# Patient Record
Sex: Male | Born: 1935 | Race: Black or African American | Hispanic: No | Smoking: Current every day smoker
Health system: Southern US, Community
[De-identification: ages and names within clinical notes are randomized; demographics above are authoritative.]

## PROBLEM LIST (undated history)

## (undated) DIAGNOSIS — R339 Retention of urine, unspecified: Secondary | ICD-10-CM

---

## 2014-03-11 ENCOUNTER — Encounter (HOSPITAL_COMMUNITY): Payer: Self-pay | Admitting: Emergency Medicine

## 2014-03-11 ENCOUNTER — Emergency Department (HOSPITAL_COMMUNITY)
Admission: EM | Admit: 2014-03-11 | Discharge: 2014-03-11 | Disposition: A | Discharge status: Home / Self Care | Payer: Medicare Other | Attending: Emergency Medicine | Admitting: Emergency Medicine

## 2014-03-11 DIAGNOSIS — Z720 Tobacco use: Secondary | ICD-10-CM | POA: Diagnosis not present

## 2014-03-11 DIAGNOSIS — R600 Localized edema: Secondary | ICD-10-CM | POA: Insufficient documentation

## 2014-03-11 DIAGNOSIS — R200 Anesthesia of skin: Secondary | ICD-10-CM | POA: Diagnosis present

## 2014-03-11 DIAGNOSIS — R252 Cramp and spasm: Secondary | ICD-10-CM | POA: Insufficient documentation

## 2014-03-11 LAB — CK: Total CK: 215 U/L (ref 7–232)

## 2014-03-11 MED ORDER — ENOXAPARIN SODIUM 100 MG/ML ~~LOC~~ SOLN
1.0000 mg/kg | Freq: Once | SUBCUTANEOUS | Status: AC
  Administered 2014-03-11: 85 mg via SUBCUTANEOUS
  Filled 2014-03-11: qty 1

## 2014-03-11 NOTE — ED Notes (Signed)
Pt arrived via EMS with c/o lt leg numbness for 1.5 yr. Numbness worse tonight. Pt was unable to get off couch.(+)PMS, no injury/deformity, CRT brisk. No swelling noted. Pt able to push/pull against hand. Unable to lift leg.

## 2014-03-11 NOTE — ED Notes (Signed)
Resting quietly with eye closed. Easily arousable. Verbally responsive. Resp even and unlabored. ABC's intact. NAD noted.  

## 2014-03-11 NOTE — ED Notes (Signed)
Resting quietly with eye closed. Easily arousable. Verbally responsive. Resp even and unlabored. ABC's intact.   

## 2014-03-11 NOTE — ED Provider Notes (Signed)
CSN: [id number]     Arrival date & time 03/11/14  0049 History   First MD Initiated Contact with Patient 03/11/14 0310     Chief Complaint  Patient presents with  . Numbness     (Consider location/radiation/quality/duration/timing/severity/associated sxs/prior Treatment) HPI  Edgar Mckay is a 79 y.o. male with unknown PMh (has not see a physician in over 30years) presenting today with left leg cramping. Nursing notes states numbness however the patient denies this. He states his leg has been cramping off and on for a year and a half. He came to the emergency department tonight because the pain was so severe he fell over. It was difficult for him to stand back up. Patient currently is denying any cramping, his symptoms have resolved. He denies any neurological symptoms such as muscle weakness or numbness anywhere. He denies any history of blood clots, but states she's had swelling in that leg as well. Patient has no history of trauma to the leg. Patient has no further complaints.  10 Systems reviewed and are negative for acute change except as noted in the HPI.     History reviewed. No pertinent past medical history. History reviewed. No pertinent past surgical history. Family History  Problem Relation Age of Onset  . Family history unknown: Yes   History  Substance Use Topics  . Smoking status: Current Every Day Smoker    Types: Cigarettes  . Smokeless tobacco: Not on file  . Alcohol Use: Yes    Review of Systems    Allergies  Review of patient's allergies indicates no known allergies.  Home Medications   Prior to Admission medications   Not on File   BP 149/73 mmHg  Pulse 60  Temp(Src) 98.3 F (36.8 C) (Oral)  Resp 16  Ht  (1.753 m)  Wt 190 lb (86.183 kg)  BMI 28.05 kg/m2  SpO2 95% Physical Exam  Constitutional: He is oriented to person, place, and time. Vital signs are normal. He appears well-developed and well-nourished.  Non-toxic appearance. He does not  appear ill. No distress.  HENT:  Head: Normocephalic and atraumatic.  Nose: Nose normal.  Mouth/Throat: Oropharynx is clear and moist. No oropharyngeal exudate.  Eyes: Conjunctivae and EOM are normal. Pupils are equal, round, and reactive to light. No scleral icterus.  Neck: Normal range of motion. Neck supple. No tracheal deviation, no edema, no erythema and normal range of motion present. No thyroid mass and no thyromegaly present.  Cardiovascular: Normal rate, regular rhythm, S1 normal, S2 normal, normal heart sounds, intact distal pulses and normal pulses.  Exam reveals no gallop and no friction rub.   No murmur heard. Pulses:      Radial pulses are 2+ on the right side, and 2+ on the left side.       Dorsalis pedis pulses are 2+ on the right side, and 2+ on the left side.  Pulmonary/Chest: Effort normal and breath sounds normal. No respiratory distress. He has no wheezes. He has no rhonchi. He has no rales.  Abdominal: Soft. Normal appearance and bowel sounds are normal. He exhibits no distension, no ascites and no mass. There is no hepatosplenomegaly. There is no tenderness. There is no rebound, no guarding and no CVA tenderness.  Musculoskeletal: Normal range of motion. He exhibits edema. He exhibits no tenderness.  Bilateral lower history edema, 1+. No calf tenderness bilaterally, negative Homans sign bilaterally.  Lymphadenopathy:    He has no cervical adenopathy.  Neurological: He is alert and  oriented to person, place, and time. He has normal strength. No cranial nerve deficit or sensory deficit. He exhibits normal muscle tone.  Normal strength and sensation x 4 ext, normal cerebellar testing  Skin: Skin is warm, dry and intact. No petechiae and no rash noted. He is not diaphoretic. No erythema. No pallor.  Nursing note and vitals reviewed.   ED Course  Procedures (including critical care time) Labs Review Labs Reviewed  CK    Imaging Review No results found.   EKG  Interpretation None      MDM   Final diagnoses:  None    Patient presents to the ED for leg cramping x 1.5 years.  He denies any numbness after being asked multiple times by myself, despite nursing notes.  He denies any trauma and he denies risk factors for DVT. Nevertheless he states he had cramping in his calf muscle as well as swelling in the leg. CK level was obtained. CKs 2:15. Leg swelling is equal for me on exam. Patient was given Lovenox emergency department and instructed to return tomorrow for ultrasound.  X-rays not indicated as there is no history of trauma. At this time his vital signs were within his normal limits and he is safe for discharge.    Tomasita CrumbleAdeleke Jeramy Dimmick, MD 03/11/14 [phone]

## 2014-03-11 NOTE — ED Notes (Signed)
Bed: WA04 Expected date: 03/11/14 Expected time: 12:33 AM Means of arrival: Ambulance Comments: Leg numbness

## 2014-03-11 NOTE — ED Notes (Signed)
Awake. Verbally responsive. A/O x4. Resp even and unlabored. No audible adventitious breath sounds noted. ABC's intact.  

## 2014-03-11 NOTE — Discharge Instructions (Signed)
Leg Cramps Mr. Edgar Mckay, your blood levels do not show any damage to the muscles. Your given a blood thinner because he may have a blood clot in your left leg. [facility] first thing in the morning at 7 AM to obtain an ultrasound of your leg. This is very important to assess for blood clots. Follow-up with her primary care physician within 3 days for further management. If symptoms worsen come back to the emergency department immediately. Thank you. Leg cramps that occur during exercise can be caused by poor circulation or dehydration. However, muscle cramps that occur at rest or during the night are usually not due to any serious medical problem. Heat cramps may cause muscle spasms during hot weather.  CAUSES There is no clear cause for muscle cramps. However, dehydration may be a factor for those who do not drink enough fluids and those who exercise in the heat. Imbalances in the level of sodium, potassium, calcium or magnesium in the muscle tissue may also be a factor. Some medications, such as water pills (diuretics), may cause loss of chemicals that the body needs (like sodium and potassium) and cause muscle cramps. TREATMENT   Make sure your diet has enough fluids and essential minerals for the muscle to work normally.  Avoid strenuous exercise for several days if you have been having frequent leg cramps.  Stretch and massage the cramped muscle for several minutes.  Some medicines may be helpful in some patients with night cramps. Only take over-the-counter or prescription medicines as directed by your caregiver. SEEK IMMEDIATE MEDICAL CARE IF:   Your leg cramps become worse.  Your foot becomes cold, numb, or blue. Document Released: 02/04/2004 Document Revised: 03/21/2011 Document Reviewed: 01/22/2008 [facility] [employer] Patient Information 2015 [place], [place]. This information is not intended to replace advice given to you by your health care provider. Make sure you discuss  any questions you have with your health care provider.

## 2014-03-13 ENCOUNTER — Ambulatory Visit (HOSPITAL_COMMUNITY)
Admission: RE | Admit: 2014-03-13 | Discharge: 2014-03-13 | Disposition: A | Discharge status: Home / Self Care | Payer: Medicare Other | Source: Ambulatory Visit | Attending: Emergency Medicine | Admitting: Emergency Medicine

## 2014-03-13 DIAGNOSIS — M7989 Other specified soft tissue disorders: Secondary | ICD-10-CM | POA: Diagnosis present

## 2014-03-13 NOTE — Progress Notes (Signed)
VASCULAR LAB PRELIMINARY  PRELIMINARY  PRELIMINARY  PRELIMINARY  Left lower extremity venous duplex completed.    Preliminary report:  Left:  No evidence of DVT, superficial thrombosis, or Baker's cyst.  Evadene Wardrip, RVT 03/13/2014, 2:51 PM

## 2019-03-07 ENCOUNTER — Other Ambulatory Visit: Payer: Self-pay

## 2019-03-07 ENCOUNTER — Encounter (HOSPITAL_COMMUNITY): Payer: Self-pay

## 2019-03-07 ENCOUNTER — Emergency Department (HOSPITAL_COMMUNITY)
Admission: EM | Admit: 2019-03-07 | Discharge: 2019-03-08 | Disposition: A | Discharge status: Home / Self Care | Payer: Medicare Other | Attending: Emergency Medicine | Admitting: Emergency Medicine

## 2019-03-07 ENCOUNTER — Emergency Department (HOSPITAL_COMMUNITY): Payer: Medicare Other

## 2019-03-07 DIAGNOSIS — I10 Essential (primary) hypertension: Secondary | ICD-10-CM | POA: Diagnosis not present

## 2019-03-07 DIAGNOSIS — R339 Retention of urine, unspecified: Secondary | ICD-10-CM

## 2019-03-07 DIAGNOSIS — N400 Enlarged prostate without lower urinary tract symptoms: Secondary | ICD-10-CM

## 2019-03-07 DIAGNOSIS — F17210 Nicotine dependence, cigarettes, uncomplicated: Secondary | ICD-10-CM | POA: Insufficient documentation

## 2019-03-07 DIAGNOSIS — N401 Enlarged prostate with lower urinary tract symptoms: Secondary | ICD-10-CM | POA: Diagnosis not present

## 2019-03-07 LAB — CBC WITH DIFFERENTIAL/PLATELET
Abs Immature Granulocytes: 0.05 10*3/uL (ref 0.00–0.07)
Basophils Absolute: 0.1 10*3/uL (ref 0.0–0.1)
Basophils Relative: 1 %
Eosinophils Absolute: 0.1 10*3/uL (ref 0.0–0.5)
Eosinophils Relative: 1 %
HCT: 50.9 % (ref 39.0–52.0)
Hemoglobin: 16.2 g/dL (ref 13.0–17.0)
Immature Granulocytes: 1 %
Lymphocytes Relative: 13 %
Lymphs Abs: 1.4 10*3/uL (ref 0.7–4.0)
MCH: 29 pg (ref 26.0–34.0)
MCHC: 31.8 g/dL (ref 30.0–36.0)
MCV: 91.1 fL (ref 80.0–100.0)
Monocytes Absolute: 0.9 10*3/uL (ref 0.1–1.0)
Monocytes Relative: 8 %
Neutro Abs: 8.5 10*3/uL — ABNORMAL HIGH (ref 1.7–7.7)
Neutrophils Relative %: 76 %
Platelets: 207 10*3/uL (ref 150–400)
RBC: 5.59 MIL/uL (ref 4.22–5.81)
RDW: 14.4 % (ref 11.5–15.5)
WBC: 11 10*3/uL — ABNORMAL HIGH (ref 4.0–10.5)
nRBC: 0 % (ref 0.0–0.2)

## 2019-03-07 LAB — COMPREHENSIVE METABOLIC PANEL
ALT: 30 U/L (ref 0–44)
AST: 41 U/L (ref 15–41)
Albumin: 4.6 g/dL (ref 3.5–5.0)
Alkaline Phosphatase: 55 U/L (ref 38–126)
Anion gap: 18 — ABNORMAL HIGH (ref 5–15)
BUN: 15 mg/dL (ref 8–23)
CO2: 21 mmol/L — ABNORMAL LOW (ref 22–32)
Calcium: 9.6 mg/dL (ref 8.9–10.3)
Chloride: 105 mmol/L (ref 98–111)
Creatinine, Ser: 0.85 mg/dL (ref 0.61–1.24)
GFR calc Af Amer: >60 mL/min (ref >60)
GFR calc non Af Amer: >60 mL/min (ref >60)
Glucose, Bld: 118 mg/dL — ABNORMAL HIGH (ref 70–99)
Potassium: 4 mmol/L (ref 3.5–5.1)
Sodium: 144 mmol/L (ref 135–145)
Total Bilirubin: 0.5 mg/dL (ref 0.3–1.2)
Total Protein: 8.3 g/dL — ABNORMAL HIGH (ref 6.5–8.1)

## 2019-03-07 LAB — URINALYSIS, ROUTINE W REFLEX MICROSCOPIC
Bacteria, UA: NONE SEEN
Bilirubin Urine: NEGATIVE
Glucose, UA: NEGATIVE mg/dL
Ketones, ur: 5 mg/dL — AB
Leukocytes,Ua: NEGATIVE
Nitrite: NEGATIVE
Protein, ur: NEGATIVE mg/dL
Specific Gravity, Urine: 1.006 (ref 1.005–1.030)
pH: 5 (ref 5.0–8.0)

## 2019-03-07 NOTE — ED Provider Notes (Addendum)
[facility] DEPT Provider Note   CSN: [id number] Arrival date & time: 03/07/19  2158     History Chief Complaint  Patient presents with  . Urinary Retention    Edgar Mckay is a 84 y.o. male with a hx of no major medical hx (does not see a PCP) presents to the Emergency Department complaining of gradual, persistent, progressively worsening urinary retention with associated abdominal pain onset 2 days ago.  Patient reports that he has urinary urgency and hesitancy.  He reports that when he urinates he is only able to produce approximately 1 teaspoon of urine.  He denies hematuria.  He does report associated abdominal pain with urination but no dysuria.  He denies a history of same.  Denies known history of BPH or other prostate pathology.  Patient reports his pain is severe and he has associated abdominal distention.  No alleviating factors.  Patient reports he smokes daily and he is a social drinker.  Denies drug use.  Denies regular medication usage.  The history is provided by the patient, medical records and the EMS personnel. No language interpreter was used.       History reviewed. No pertinent past medical history.  There are no problems to display for this patient.   History reviewed. No pertinent surgical history.     Family History  Family history unknown: Yes    Social History   Tobacco Use  . Smoking status: Current Every Day Smoker    Types: Cigarettes  Substance Use Topics  . Alcohol use: Yes  . Drug use: No    Home Medications Prior to Admission medications   Not on File    Allergies    Patient has no known allergies.  Review of Systems   Review of Systems  Constitutional: Negative for appetite change, diaphoresis, fatigue, fever and unexpected weight change.  HENT: Negative for mouth sores.   Eyes: Negative for visual disturbance.  Respiratory: Negative for cough, chest tightness, shortness of breath and wheezing.     Cardiovascular: Negative for chest pain.  Gastrointestinal: Positive for abdominal distention and abdominal pain. Negative for constipation, diarrhea, nausea and vomiting.  Endocrine: Negative for polydipsia, polyphagia and polyuria.  Genitourinary: Positive for difficulty urinating and urgency. Negative for dysuria, frequency and hematuria.  Musculoskeletal: Negative for back pain and neck stiffness.  Skin: Negative for rash.  Allergic/Immunologic: Negative for immunocompromised state.  Neurological: Negative for syncope, light-headedness and headaches.  Hematological: Does not bruise/bleed easily.  Psychiatric/Behavioral: Negative for sleep disturbance. The patient is not nervous/anxious.     Physical Exam Updated Vital Signs BP (!) 156/75 (BP Location: Right Arm)   Pulse 91   Temp 98 F (36.7 C) (Oral)   Resp 16   SpO2 94%   Physical Exam Vitals and nursing note reviewed.  Constitutional:      General: He is not in acute distress.    Appearance: He is not diaphoretic.  HENT:     Head: Normocephalic.  Eyes:     General: No scleral icterus.    Conjunctiva/sclera: Conjunctivae normal.  Cardiovascular:     Rate and Rhythm: Regular rhythm. Tachycardia present.     Pulses: Normal pulses.          Radial pulses are 2+ on the right side and 2+ on the left side.  Pulmonary:     Effort: No tachypnea, accessory muscle usage, prolonged expiration, respiratory distress or retractions.     Breath sounds: No stridor.  Comments: Equal chest rise. No increased work of breathing. Abdominal:     General: There is distension.     Palpations: Abdomen is soft.     Tenderness: There is abdominal tenderness in the periumbilical area and suprapubic area. There is no right CVA tenderness, left CVA tenderness, guarding or rebound.     Comments: Bladder is palpably distended.  Musculoskeletal:     Cervical back: Normal range of motion.     Right lower leg: Edema present.     Left lower  leg: Edema present.     Comments: Moves all extremities equally and without difficulty. Mild peripheral edema  Skin:    General: Skin is warm and dry.     Capillary Refill: Capillary refill takes less than 2 seconds.  Neurological:     Mental Status: He is alert.     GCS: GCS eye subscore is 4. GCS verbal subscore is 5. GCS motor subscore is 6.     Comments: Speech is clear and goal oriented.  Psychiatric:        Mood and Affect: Mood normal.     ED Results / Procedures / Treatments   Labs (all labs ordered are listed, but only abnormal results are displayed) Labs Reviewed  CBC WITH DIFFERENTIAL/PLATELET - Abnormal; Notable for the following components:      Result Value   WBC 11.0 (*)    Neutro Abs 8.5 (*)    All other components within normal limits  COMPREHENSIVE METABOLIC PANEL - Abnormal; Notable for the following components:   CO2 21 (*)    Glucose, Bld 118 (*)    Total Protein 8.3 (*)    Anion gap 18 (*)    All other components within normal limits  URINALYSIS, ROUTINE W REFLEX MICROSCOPIC - Abnormal; Notable for the following components:   Hgb urine dipstick MODERATE (*)    Ketones, ur 5 (*)    All other components within normal limits  URINE CULTURE    Radiology CT Renal Stone Study  Result Date: 03/08/2019 CLINICAL DATA:  Urinary retention for 1 day, hematuria EXAM: CT ABDOMEN AND PELVIS WITHOUT CONTRAST TECHNIQUE: Multidetector CT imaging of the abdomen and pelvis was performed following the standard protocol without IV contrast. COMPARISON:  None. FINDINGS: Lower chest: No acute pleural or parenchymal lung disease. Hepatobiliary: Small calcified gallstone without cholecystitis. Fatty infiltration of the liver without focal abnormality. Pancreas: Unremarkable. No pancreatic ductal dilatation or surrounding inflammatory changes. Spleen: Normal in size without focal abnormality. Adrenals/Urinary Tract: No urinary tract calculi or obstructive uropathy within either  kidney. The adrenals are unremarkable. The bladder is decompressed with a Foley catheter. Stomach/Bowel: No bowel obstruction or ileus. Normal appendix right lower quadrant. No inflammatory changes or wall thickening. Vascular/Lymphatic: Minimal atherosclerosis of the abdominal aorta. No free fluid or free gas. Reproductive: Marked enlargement of the prostate, measuring 6.1 by 5.8 by 5.5 cm. Other: There is a fat containing left inguinal hernia. No bowel herniation. No free fluid. Musculoskeletal: No acute or destructive bony lesions. Reconstructed images demonstrate lower lumbar spondylosis. IMPRESSION: 1. Marked enlargement of the prostate. 2. No urinary tract calculi or obstructive uropathy. 3. Cholelithiasis without cholecystitis. 4. Fat containing left inguinal hernia. 5. Fatty liver. Electronically Signed   By: Sharlet Salina M.D.   On: 03/08/2019 00:14    Procedures Procedures (including critical care time)  Medications Ordered in ED Medications - No data to display  ED Course  I have reviewed the triage vital signs and the  nursing notes.  Pertinent labs & imaging results that were available during my care of the patient were reviewed by me and considered in my medical decision making (see chart for details).  Clinical Course as of Mar 08 131  Thu Mar 07, 2019  2219 Bladder scan with   [HM]  Fri Mar 08, 2019  0132 No proteinuria  Protein: NEGATIVE [HM]  0132 Improved blood pressure  BP(!): 156/75 [HM]    Clinical Course User Index [HM] Gian Ybarra, Boyd Kerbs   MDM Rules/Calculators/A&P                      Patient presents with urinary retention, abdominal distention and abdominal pain.  Bladder scan with 820 mL.  Foley catheter to be placed.  Patient noted to be hypertensive and slightly tachycardic.  Suspect this is secondary to pain.  Will monitor vital signs.  Patient without chest pain or shortness of breath.  Doubt acute coronary syndrome or hypertensive  urgency.  Labs reassuring.  Urinalysis does show moderate hemoglobin.  Patient denies flank pain and has no history of stones however does not regularly see a physician.  Mild leukocytosis is noted.  Mild elevation of anion gap however given small ketones and patient admission that he has not been eating and drinking for the last several days I suspect this is likely the cause.  Patient is alert and oriented to person, place and current events.  He is afebrile.  No evidence of sirs or sepsis.  CT scan without evidence of urinary tract stone.  Enlarged prostate is noted which is likely the cause of his urinary retention.  12:30 AM On reassessment, patient is resting comfortably.  He has no additional abdominal pain.  Abdominal distention has resolved and abdomen is soft and nontender.  Heart rate and blood pressure have improved significantly.  Patient reports he feels well and is ready to go home.  I discussed findings of lab work and CT with patient and answered questions.  Further discussion will be had with patient's son who will assist him in making appointments and getting to and from.  1:19 AM With patient's permission, I called and discussed today's findings with his son Starr Sinclair, Montez Hageman.  I have discussed lab work, CT scan results, placement of Foley catheter and the need for close urology follow-up.  Patient reports his son will help him make the appointment and drive him to and from the appointment.  I have answered questions for both the patient and patient's son.  Patient remains hypertensive however with significant improvement in his blood pressure.  No persistent tachycardia.  Patient will also need close primary care follow-up for reassessment of his blood pressure and further evaluation.  Patient states understanding and is in agreement with the plan.  The patient was discussed with and seen by Dr. Eudelia Bunch who agrees with the treatment plan.  Final Clinical Impression(s) / ED  Diagnoses Final diagnoses:  Urinary retention  Enlarged prostate  Hypertension, unspecified type    Rx / DC Orders ED Discharge Orders    None         Simmie Camerer, Boyd Kerbs 03/08/19 0133    Nira Conn, MD 03/08/19 [id number]

## 2019-03-07 NOTE — ED Triage Notes (Signed)
Pt arrived via [facility] from home with complaints of urinary retention x1 day.

## 2019-03-08 LAB — URINE CULTURE: Culture: NO GROWTH

## 2019-03-08 NOTE — Discharge Instructions (Addendum)
1. Medications: usual home medications 2. Treatment: rest, drink plenty of fluids, leave Foley catheter in place 3. Follow Up: Please followup with urology for further evaluation of your urinary retention.  Please call later today to make an appointment for the next several days; Please return to the ER for return of abdominal pain, fevers, chills, nausea, vomiting or other concerns.

## 2019-03-08 NOTE — ED Notes (Signed)
Pt and pt son educated on leg bag, and standard drainage bag. How to empty and switch and care for at home verbalized by patient.

## 2019-03-08 NOTE — Progress Notes (Signed)
TOC CM spoke to pt's son, Kelly Eisler. Gave permission to arrange follow up appt with to establish with a PCP. States he goes to [facility]. Contacted [facility] and they do not accept new hospital patient follow up appts. Contacted [facility], [place] (4 miles from pt's home). Appt scheduled with Dr Yves Dill for 03/26/2019 at  3:30 pm. Called to make son aware, provided him with appt information. Will mail brochure with appt information. Referring ED provider updated. Isidoro Donning RN CCM, [facility] ED TOC CM [phone]

## 2019-03-26 ENCOUNTER — Ambulatory Visit: Payer: Medicare Other | Admitting: Family Medicine

## 2019-03-26 DIAGNOSIS — Z0289 Encounter for other administrative examinations: Secondary | ICD-10-CM

## 2019-04-13 ENCOUNTER — Other Ambulatory Visit: Payer: Self-pay

## 2019-04-13 ENCOUNTER — Encounter (HOSPITAL_COMMUNITY): Payer: Self-pay | Admitting: Emergency Medicine

## 2019-04-13 ENCOUNTER — Emergency Department (HOSPITAL_COMMUNITY)
Admission: EM | Admit: 2019-04-13 | Discharge: 2019-04-14 | Disposition: A | Discharge status: Home / Self Care | Payer: Medicare Other | Attending: Emergency Medicine | Admitting: Emergency Medicine

## 2019-04-13 DIAGNOSIS — R339 Retention of urine, unspecified: Secondary | ICD-10-CM

## 2019-04-13 DIAGNOSIS — N472 Paraphimosis: Secondary | ICD-10-CM | POA: Diagnosis not present

## 2019-04-13 DIAGNOSIS — N401 Enlarged prostate with lower urinary tract symptoms: Secondary | ICD-10-CM | POA: Insufficient documentation

## 2019-04-13 DIAGNOSIS — R8281 Pyuria: Secondary | ICD-10-CM | POA: Diagnosis not present

## 2019-04-13 DIAGNOSIS — F17210 Nicotine dependence, cigarettes, uncomplicated: Secondary | ICD-10-CM | POA: Diagnosis not present

## 2019-04-13 HISTORY — DX: Retention of urine, unspecified: R33.9

## 2019-04-13 NOTE — ED Triage Notes (Signed)
Pt to ED via [facility] with c/o urinary retention.  Pt st's he had a indwelling cath for approx 1 month and had it removed on 4/1.  St's he has not voided since cath was removed.

## 2019-04-13 NOTE — ED Provider Notes (Signed)
[facility] EMERGENCY DEPARTMENT Provider Note   CSN: [id number] Arrival date & time: 04/13/19  2232     History Chief Complaint  Patient presents with  . Urinary Retention    Edgar Mckay is a 84 y.o. male.   84 year old male presents to the emergency department for evaluation of urinary retention.  He had a Foley catheter placed at the end of February.  This was removed in the urology office on 04/11/2019.  Patient states that he went home and was cleaning his penis and foreskin.  He retracted his foreskin behind the glans, but was unable to put his foreskin back over when he was finished. Has since noted some swelling and inability to void. Endorses last urinating 2 days ago. Reports lower abdominal discomfort and pressure.   The history is provided by the patient. No language interpreter was used.       Past Medical History:  Diagnosis Date  . Urinary retention     There are no problems to display for this patient.   History reviewed. No pertinent surgical history.     Family History  Family history unknown: Yes    Social History   Tobacco Use  . Smoking status: Current Every Day Smoker    Types: Cigarettes  . Smokeless tobacco: Never Used  Substance Use Topics  . Alcohol use: Yes  . Drug use: No    Home Medications Prior to Admission medications   Medication Sig Start Date End Date Taking? Authorizing Provider  tamsulosin (FLOMAX) 0.4 MG CAPS capsule Take 1 capsule (0.4 mg total) by mouth daily. 04/14/19   Antonietta Breach, PA-C    Allergies    Patient has no known allergies.  Review of Systems   Review of Systems  Ten systems reviewed and are negative for acute change, except as noted in the HPI.    Physical Exam Updated Vital Signs BP (!) 152/85 (BP Location: Right Arm)   Pulse 95   Temp 99 F (37.2 C) (Oral)   Resp 18   Ht 5\' 9"  (1.753 m)   Wt 94.8 kg   SpO2 97%   BMI 30.86 kg/m   Physical Exam Vitals and nursing note  reviewed. Exam conducted with a chaperone present.  Constitutional:      General: He is not in acute distress.    Appearance: He is well-developed. He is not diaphoretic.     Comments: Nontoxic appearing  HENT:     Head: Normocephalic and atraumatic.  Eyes:     General: No scleral icterus.    Conjunctiva/sclera: Conjunctivae normal.  Pulmonary:     Effort: Pulmonary effort is normal. No respiratory distress.     Comments: Respirations even and unlabored Genitourinary:    Comments: Paraphimosis with associated edema of the glans penis. Musculoskeletal:        General: Normal range of motion.     Cervical back: Normal range of motion.  Skin:    General: Skin is warm and dry.     Coloration: Skin is not pale.     Findings: No erythema or rash.  Neurological:     General: No focal deficit present.     Mental Status: He is alert and oriented to person, place, and time.     Coordination: Coordination normal.  Psychiatric:        Behavior: Behavior normal.            ED Results / Procedures / Treatments   Labs (all  labs ordered are listed, but only abnormal results are displayed) Labs Reviewed  URINALYSIS, ROUTINE W REFLEX MICROSCOPIC - Abnormal; Notable for the following components:      Result Value   APPearance CLOUDY (*)    Hgb urine dipstick SMALL (*)    Leukocytes,Ua LARGE (*)    WBC, UA >50 (*)    Bacteria, UA FEW (*)    All other components within normal limits  URINE CULTURE  I-STAT CREATININE, ED    EKG None  Radiology No results found.  Procedures Procedures (including critical care time)  Medications Ordered in ED Medications - No data to display  ED Course  I have reviewed the triage vital signs and the nursing notes.  Pertinent labs & imaging results that were available during my care of the patient were reviewed by me and considered in my medical decision making (see chart for details).  Clinical Course as of Apr 14 34  Sat Apr 13, 2019   2324 Case discussed with Dr. Retta Diones of Urology. Recommends attempt and manual reduction of foreskin over glans at bedside. If successful, subsequently place foley. Advises to call back with any issue.   [KH]  2350 Attempt at reduction x 3 at bedside without success. Chaperoned by RN. Dr. Retta Diones notified. Will assess patient in the department.   [KH]  2351 In and out cath done at bedside for symptomatic relief. Plan for placement of 16Fr catheter after reduction of paraphimosis.   [KH]  Sun Apr 14, 2019  0007 Successful reduction of paraphimosis at bedside by Dr. Retta Diones. 16Fr foley placed as well. See his consultation note for further detail.   [KH]  [phone] Patient with pyuria, but no preceding complaints of dysuria.  He is afebrile in the emergency department.  Previously had a Foley placed for 1 month.  Pyuria favored to be secondary to recent chronic indwelling catheter.  Will send urine for culture.  Urology able to start on antibiotics if they feel this is clinically indicated.  Plan for discharge on Flomax.   [KH]    Clinical Course User Index [KH] [name]   MDM Rules/Calculators/A&P                      84 year old male presenting to the emergency department for urinary retention since Foley catheter removal on 04/11/2019.  Hx of BPH.  Also noted to have paraphimosis which was reduced at bedside by Dr. Retta Diones of Urology.  Patient had 16Fr Foley catheter placed prior to discharge.  He is stable for repeat follow-up in the office with [facility].  Started on daily Flomax.  Return precautions discussed and provided. Patient discharged in stable condition with no unaddressed concerns.   Final Clinical Impression(s) / ED Diagnoses Final diagnoses:  Urinary retention  Paraphimosis    Rx / DC Orders ED Discharge Orders         Ordered    tamsulosin (FLOMAX) 0.4 MG CAPS capsule  Daily     04/14/19 0011           [name], PA-C 04/14/19 0038    Dione Booze, MD 04/14/19 [phone]

## 2019-04-14 LAB — URINALYSIS, ROUTINE W REFLEX MICROSCOPIC
Bilirubin Urine: NEGATIVE
Glucose, UA: NEGATIVE mg/dL
Ketones, ur: NEGATIVE mg/dL
Nitrite: NEGATIVE
Protein, ur: NEGATIVE mg/dL
Specific Gravity, Urine: 1.011 (ref 1.005–1.030)
WBC, UA: >50 WBC/hpf — ABNORMAL HIGH (ref 0–5)
pH: 6 (ref 5.0–8.0)

## 2019-04-14 LAB — I-STAT CREATININE, ED: Creatinine, Ser: 0.8 mg/dL (ref 0.61–1.24)

## 2019-04-14 MED ORDER — TAMSULOSIN HCL 0.4 MG PO CAPS: 0.4000 mg | ORAL_CAPSULE | Freq: Every day | ORAL | 0 refills | Status: DC

## 2019-04-14 NOTE — Consult Note (Signed)
Urology Consult  Consulting MD: Dione Booze, MD  CC: Difficulty urinating,  paraphimosis  HPI: This is a 84year old male apparently seen in our office before for urinary retention.  Catheter was removed recently, apparently after a successful voiding trial.  The patient presents with inability to void for over 24 hours as well as a swollen glans penis secondary to paraphimosis.  This was not able to be reduced in the emergency room and urologic consultation is requested.  PMH: Past Medical History:  Diagnosis Date  . Urinary retention     PSH: History reviewed. No pertinent surgical history.  Allergies: No Known Allergies  Medications: (Not in a hospital admission)    Social History: Social History   Socioeconomic History  . Marital status: Legally Separated    Spouse name: Not on file  . Number of children: Not on file  . Years of education: Not on file  . Highest education level: Not on file  Occupational History  . Not on file  Tobacco Use  . Smoking status: Current Every Day Smoker    Types: Cigarettes  . Smokeless tobacco: Never Used  Substance and Sexual Activity  . Alcohol use: Yes  . Drug use: No  . Sexual activity: Not on file  Other Topics Concern  . Not on file  Social History Narrative  . Not on file   Social Determinants of Health   Financial Resource Strain:   . Difficulty of Paying Living Expenses:   Food Insecurity:   . Worried About Programme researcher, broadcasting/film/video in the Last Year:   . Barista in the Last Year:   Transportation Needs:   . Freight forwarder (Medical):   Marland Kitchen Lack of Transportation (Non-Medical):   Physical Activity:   . Days of Exercise per Week:   . Minutes of Exercise per Session:   Stress:   . Feeling of Stress :   Social Connections:   . Frequency of Communication with Friends and Family:   . Frequency of Social Gatherings with Friends and Family:   . Attends Religious Services:   . Active Member of Clubs or  Organizations:   . Attends Banker Meetings:   Marland Kitchen Marital Status:   Intimate Partner Violence:   . Fear of Current or Ex-Partner:   . Emotionally Abused:   Marland Kitchen Physically Abused:   . Sexually Abused:     Family History: Family History  Family history unknown: Yes    Review of Systems: Positive: Swollen penis, leg swelling, inability urinate, bladder pain Negative:   A further 10 point review of systems was negative except what is listed in the HPI.  Physical Exam: @VITALS2 @ General: No acute distress.  Awake. Head:  Normocephalic.  Atraumatic. ENT:  EOMI.  Mucous membranes moist Neck:  Supple.  No lymphadenopathy. CV:  Regular rate.  2-3+ bilateral lower extremity pitting edema Pulmonary: Equal effort bilaterally.   Skin:  Normal turgor.  No visible rash. Extremity: No gross deformity of upper extremities.  No gross deformity of lower extremities. Neurologic: Alert. Appropriate mood.  Penis:  Uncircumcised.  No lesions.  Paraphimosis Urethra: Orthotopic meatus. Scrotum: No lesions.  No ecchymosis.  No erythema.  Paraphimosis was reduced by circumferential pressure on his glans and distal penis.  Following this, penis was prepped with Betadine and 16 French Foley catheter was placed without difficulty.  This was hooked to a bedside bag.  Studies:  No results for input(s): HGB, WBC, PLT in  the last 72 hours.  No results for input(s): NA, K, CL, CO2, BUN, CREATININE, CALCIUM, GFRNONAA, GFRAA in the last 72 hours.  Invalid input(s): MAGNESIUM   No results for input(s): INR, APTT in the last 72 hours.  Invalid input(s): PT   Invalid input(s): ABG    Assessment: 1.  Paraphimosis, reduced 2.  Urinary retention, recurrent  Plan: I instructed the patient to make sure his foreskin stays reduced I have asked him to follow-up in our office for another voiding trial Patient to be started on tamsulosin    Pager:[phone]

## 2019-04-14 NOTE — Discharge Instructions (Addendum)
Take Flomax daily as prescribed and follow-up in the office with [facility].  Call on Monday morning to schedule a follow-up appointment.

## 2019-04-14 NOTE — ED Notes (Signed)
Pt verbalized understanding of d/c instructions, need for follow up with urology and s/s requiring return to ed. Pt had no further questions at this time. Foley leg bag applied to patient.

## 2019-04-16 LAB — URINE CULTURE: Culture: >=100000 — AB

## 2019-04-17 ENCOUNTER — Telehealth: Payer: Self-pay | Admitting: *Deleted

## 2019-04-17 NOTE — Telephone Encounter (Signed)
Post ED Visit - Positive Culture Follow-up  Culture report reviewed by antimicrobial stewardship pharmacist: [facility] []  , Pharm.D. []  Enzo Bi, Pharm.D., BCPS AQ-ID []  , Pharm.D., BCPS []  Celedonio Miyamoto, Pharm.D., BCPS []  [place], Garvin Fila.D., BCPS, AAHIVP []  , Pharm.D., BCPS, AAHIVP []  Georgina Pillion, PharmD, BCPS []  , PharmD, BCPS []  [place], PharmD, BCPS []  [address], PharmD []  , PharmD, BCPS []  Estella Husk, PharmD , PharmD  [facility] []  , PharmD []  Phillips Climes, PharmD []  , PharmD []  Agapito Games, Rph []  ) Verlan Friends, PharmD []  , PharmD []  Mervyn Gay, PharmD []  , PharmD []  Vinnie Level, PharmD []  Russella Dar, PharmD []  [facility], PharmD []  , PharmD []  Len Childs, PharmD   Positive urine culture Scheduled for follow up with urology and no further patient follow-up is required at this time.  [facility] 04/17/2019, 10:43 AM

## 2019-04-24 MED FILL — TAMSULOSIN HCL 0.4 MG CAP: 0.4 | 30 days supply | Qty: 30 | Fill #0

## 2019-07-31 ENCOUNTER — Encounter (HOSPITAL_COMMUNITY): Payer: Self-pay | Admitting: Emergency Medicine

## 2019-07-31 ENCOUNTER — Emergency Department (HOSPITAL_COMMUNITY)
Admission: EM | Admit: 2019-07-31 | Discharge: 2019-08-01 | Disposition: A | Discharge status: Home / Self Care | Payer: Medicare Other | Attending: Emergency Medicine | Admitting: Emergency Medicine

## 2019-07-31 ENCOUNTER — Emergency Department (HOSPITAL_COMMUNITY): Payer: Medicare Other

## 2019-07-31 DIAGNOSIS — J189 Pneumonia, unspecified organism: Secondary | ICD-10-CM | POA: Insufficient documentation

## 2019-07-31 DIAGNOSIS — F17210 Nicotine dependence, cigarettes, uncomplicated: Secondary | ICD-10-CM | POA: Insufficient documentation

## 2019-07-31 DIAGNOSIS — K5900 Constipation, unspecified: Secondary | ICD-10-CM | POA: Insufficient documentation

## 2019-07-31 DIAGNOSIS — R102 Pelvic and perineal pain: Secondary | ICD-10-CM | POA: Diagnosis present

## 2019-07-31 DIAGNOSIS — R3911 Hesitancy of micturition: Secondary | ICD-10-CM | POA: Diagnosis not present

## 2019-07-31 DIAGNOSIS — N401 Enlarged prostate with lower urinary tract symptoms: Secondary | ICD-10-CM | POA: Diagnosis not present

## 2019-07-31 LAB — URINALYSIS, ROUTINE W REFLEX MICROSCOPIC
Bilirubin Urine: NEGATIVE
Glucose, UA: NEGATIVE mg/dL
Hgb urine dipstick: NEGATIVE
Ketones, ur: 20 mg/dL — AB
Leukocytes,Ua: NEGATIVE
Nitrite: NEGATIVE
Protein, ur: NEGATIVE mg/dL
Specific Gravity, Urine: 1.025 (ref 1.005–1.030)
pH: 5 (ref 5.0–8.0)

## 2019-07-31 LAB — COMPREHENSIVE METABOLIC PANEL
ALT: 20 U/L (ref 0–44)
AST: 22 U/L (ref 15–41)
Albumin: 3.7 g/dL (ref 3.5–5.0)
Alkaline Phosphatase: 55 U/L (ref 38–126)
Anion gap: 10 (ref 5–15)
BUN: 15 mg/dL (ref 8–23)
CO2: 29 mmol/L (ref 22–32)
Calcium: 9.2 mg/dL (ref 8.9–10.3)
Chloride: 104 mmol/L (ref 98–111)
Creatinine, Ser: 0.76 mg/dL (ref 0.61–1.24)
GFR calc Af Amer: >60 mL/min (ref >60)
GFR calc non Af Amer: >60 mL/min (ref >60)
Glucose, Bld: 116 mg/dL — ABNORMAL HIGH (ref 70–99)
Potassium: 3.3 mmol/L — ABNORMAL LOW (ref 3.5–5.1)
Sodium: 143 mmol/L (ref 135–145)
Total Bilirubin: 1.3 mg/dL — ABNORMAL HIGH (ref 0.3–1.2)
Total Protein: 7.7 g/dL (ref 6.5–8.1)

## 2019-07-31 LAB — CBC
HCT: 44.4 % (ref 39.0–52.0)
Hemoglobin: 14.1 g/dL (ref 13.0–17.0)
MCH: 28.1 pg (ref 26.0–34.0)
MCHC: 31.8 g/dL (ref 30.0–36.0)
MCV: 88.6 fL (ref 80.0–100.0)
Platelets: 211 10*3/uL (ref 150–400)
RBC: 5.01 MIL/uL (ref 4.22–5.81)
RDW: 15.1 % (ref 11.5–15.5)
WBC: 10 10*3/uL (ref 4.0–10.5)
nRBC: 0 % (ref 0.0–0.2)

## 2019-07-31 LAB — LIPASE, BLOOD: Lipase: 34 U/L (ref 11–51)

## 2019-07-31 MED ORDER — IOHEXOL 300 MG/ML  SOLN
100.0000 mL | Freq: Once | INTRAMUSCULAR | Status: AC | PRN
  Administered 2019-07-31: 100 mL via INTRAVENOUS

## 2019-07-31 MED ORDER — SODIUM CHLORIDE (PF) 0.9 % IJ SOLN
INTRAMUSCULAR | Status: AC
  Filled 2019-07-31: qty 50

## 2019-07-31 NOTE — ED Provider Notes (Signed)
[facility]-EMERGENCY DEPT Provider Note   CSN: [id number] Arrival date & time: 07/31/19  1011     History Chief Complaint  Patient presents with  . Abdominal Pain    Edgar Mckay is a 84 y.o. male.  Patient is a poor historian.  When I asked him why he is here he said it is a same thing as the last time he was here.  Looks like his last visit he had urinary retention.  When I asked him if he is having difficulty urinating he said since June.  Sounds like he has been dribbling and not having a strong stream.  Complaining of pain in his lower abdomen and pain in his back.  No nausea vomiting diarrhea.  No fevers or chills.  The history is provided by the patient.  Abdominal Pain Pain location:  Suprapubic Pain quality: aching   Pain radiates to:  Back Pain severity:  Moderate Onset quality:  Gradual Timing:  Intermittent Progression:  Unchanged Chronicity:  Recurrent Context: not trauma   Relieved by:  None tried Worsened by:  Nothing Ineffective treatments:  None tried Associated symptoms: no chest pain, no cough, no dysuria, no fever, no hematuria, no nausea, no shortness of breath, no sore throat and no vomiting   Risk factors: being elderly        Past Medical History:  Diagnosis Date  . Urinary retention     There are no problems to display for this patient.   History reviewed. No pertinent surgical history.     Family History  Family history unknown: Yes    Social History   Tobacco Use  . Smoking status: Current Every Day Smoker    Types: Cigarettes  . Smokeless tobacco: Never Used  Substance Use Topics  . Alcohol use: Yes  . Drug use: No    Home Medications Prior to Admission medications   Medication Sig Start Date End Date Taking? Authorizing Provider  tamsulosin (FLOMAX) 0.4 MG CAPS capsule Take 1 capsule (0.4 mg total) by mouth daily. 04/14/19   [name], PA-C    Allergies    Patient has no known allergies.  Review  of Systems   Review of Systems  Constitutional: Negative for fever.  HENT: Negative for sore throat.   Eyes: Negative for visual disturbance.  Respiratory: Negative for cough and shortness of breath.   Cardiovascular: Negative for chest pain.  Gastrointestinal: Positive for abdominal pain. Negative for nausea and vomiting.  Genitourinary: Positive for difficulty urinating. Negative for dysuria and hematuria.  Musculoskeletal: Positive for back pain and gait problem.  Skin: Negative for rash.  Neurological: Negative for headaches.    Physical Exam Updated Vital Signs BP (!) 168/88 (BP Location: Left Arm)   Pulse 85   Temp 99.2 F (37.3 C) (Oral)   Resp 16   SpO2 96%   Physical Exam Vitals and nursing note reviewed.  Constitutional:      Appearance: Normal appearance. He is well-developed.  HENT:     Head: Normocephalic and atraumatic.  Eyes:     Conjunctiva/sclera: Conjunctivae normal.  Cardiovascular:     Rate and Rhythm: Normal rate and regular rhythm.     Heart sounds: No murmur heard.   Pulmonary:     Effort: Pulmonary effort is normal. No respiratory distress.     Breath sounds: Normal breath sounds.  Abdominal:     Palpations: Abdomen is soft.     Tenderness: There is abdominal tenderness in the suprapubic  area. There is no guarding or rebound.  Musculoskeletal:        General: No deformity.     Cervical back: Neck supple.  Skin:    General: Skin is warm and dry.  Neurological:     General: No focal deficit present.     Mental Status: He is alert.     ED Results / Procedures / Treatments   Labs (all labs ordered are listed, but only abnormal results are displayed) Labs Reviewed  COMPREHENSIVE METABOLIC PANEL - Abnormal; Notable for the following components:      Result Value   Potassium 3.3 (*)    Glucose, Bld 116 (*)    Total Bilirubin 1.3 (*)    All other components within normal limits  URINALYSIS, ROUTINE W REFLEX MICROSCOPIC - Abnormal; Notable  for the following components:   Color, Urine AMBER (*)    Ketones, ur 20 (*)    All other components within normal limits  LIPASE, BLOOD  CBC    EKG None  Radiology CT Abdomen Pelvis W Contrast  Result Date: 08/01/2019 CLINICAL DATA:  Abdominal pain. Right shoulder pain. Back pain. Urinary incontinence. EXAM: CT ABDOMEN AND PELVIS WITH CONTRAST TECHNIQUE: Multidetector CT imaging of the abdomen and pelvis was performed using the standard protocol following bolus administration of intravenous contrast. CONTRAST:  OMNIPAQUE IOHEXOL 300 MG/ML  SOLN COMPARISON:  03/07/1999 FINDINGS: Lower chest: There is a small left-sided pleural effusion. There is atelectasis at the left lung base. There is an area of consolidation involving the lingula with possible underlying cavitation. There is atelectasis at the right lung base.The heart is enlarged. Hepatobiliary: The liver is normal. Cholelithiasis without acute inflammation.There is no biliary ductal dilation. Pancreas: Normal contours without ductal dilatation. No peripancreatic fluid collection. Spleen: Unremarkable. Adrenals/Urinary Tract: --Adrenal glands: Unremarkable. --Right kidney/ureter: No hydronephrosis or radiopaque kidney stones. --Left kidney/ureter: No hydronephrosis or radiopaque kidney stones. --Urinary bladder: There is mild diffuse bladder wall thickening felt to be secondary to chronic outlet obstruction. Stomach/Bowel: --Stomach/Duodenum: There is a small hiatal hernia. --Small bowel: Unremarkable. --Colon: There is a large amount of stool throughout the colon. --Appendix: Normal. Vascular/Lymphatic: Atherosclerotic calcification is present within the non-aneurysmal abdominal aorta, without hemodynamically significant stenosis. --No retroperitoneal lymphadenopathy. --No mesenteric lymphadenopathy. --No pelvic or inguinal lymphadenopathy. Reproductive: Unremarkable Other: No ascites or free air. The abdominal wall is normal.  Musculoskeletal. No acute displaced fractures. IMPRESSION: 1. There is an area of consolidation involving the lingula with possible underlying cavitation. This is concerning for cavitary pneumonia. Follow-up to resolution is recommended. 2. There is a small left-sided pleural effusion with atelectasis at the left lung base. 3. Cardiomegaly. 4. Cholelithiasis without acute inflammation. 5. Large amount of stool throughout the colon. 6. Mild diffuse bladder wall thickening felt to be secondary to chronic outlet obstruction. 7. Marked prostatomegaly. Aortic Atherosclerosis (ICD10-I70.0). Electronically Signed   By: Katherine Mantle M.D.   On: 08/01/2019 00:23    Procedures Procedures (including critical care time)  Medications Ordered in ED Medications  iohexol (OMNIPAQUE) 300 MG/ML solution 100 mL (100 mLs Intravenous Contrast Given 07/31/19 2355)  sodium chloride (PF) 0.9 % injection (  Given by Other 08/01/19 0026)    ED Course  I have reviewed the triage vital signs and the nursing notes.  Pertinent labs & imaging results that were available during my care of the patient were reviewed by me and considered in my medical decision making (see chart for details).  Clinical Course as of Jul 31 1105  Wed Jul 31, 2019  2234 Patient was bladder scanned and had 240 cc in.   [MB]    Clinical Course User Index [MB] Terrilee Files, MD   MDM Rules/Calculators/A&P                         This patient complains of abdomen and back pain, poor urine stream this involves an extensive number of treatment Options and is a complaint that carries with it a high risk of complications and Morbidity. The differential includes urinary retention, BPH, UTI, constipation, diverticulitis, colitis, obstruction, aneurysm  I ordered, reviewed and interpreted labs, which included CBC with normal white count normal hemoglobin, chemistries normal other than mildly low potassium and elevated glucose, urinalysis  without any signs of infection I ordered medication none I ordered imaging studies which included CT abdomen and pelvis and I independently    visualized and interpreted imaging which showed moderate colonic stool, bladder thickening reflecting some element of outlet obstruction Previous records obtained and reviewed in epic, has been seen before for urinary symptoms and treated with Flomax  After the interventions stated above, I reevaluated the patient and found patient has been asymptomatic in the department.  Lab work and imaging do not show any acute findings that would require hospitalization.  We will put him back on Flomax, treat possible pneumonia with antibiotics, and give MiraLAX for his constipation.  Recommended he follow-up with urology and [facility] for further management of his symptoms.  Return instructions discussed.   Final Clinical Impression(s) / ED Diagnoses Final diagnoses:  Community acquired pneumonia, unspecified laterality  Constipation, unspecified constipation type  Benign prostatic hyperplasia with urinary hesitancy    Rx / DC Orders ED Discharge Orders         Ordered    tamsulosin (FLOMAX) 0.4 MG CAPS capsule  Daily     Discontinue  Reprint     08/01/19 0035    polyethylene glycol (MIRALAX / GLYCOLAX) 17 g packet  Daily     Discontinue  Reprint     08/01/19 0035    azithromycin (ZITHROMAX) 250 MG tablet  Daily     Discontinue  Reprint     08/01/19 0035           Terrilee Files, MD 08/01/19 1110

## 2019-07-31 NOTE — ED Triage Notes (Signed)
Per pt, states he does not know what's wrong, having abdominal pain, right shoulder pain, back pain-no N/V/D-some urinary incontinence which is not new-patient is a poor historian-states he uses ETOH and "drugs" to help pain, states "he's got to do something"

## 2019-08-01 MED ORDER — TAMSULOSIN HCL 0.4 MG PO CAPS
0.4000 mg | ORAL_CAPSULE | Freq: Every day | ORAL | 0 refills | Status: DC
Start: 2019-08-01 — End: 2020-10-07

## 2019-08-01 MED ORDER — POLYETHYLENE GLYCOL 3350 17 G PO PACK: 17.0000 g | PACK | Freq: Every day | ORAL | 0 refills | Status: DC

## 2019-08-01 MED ORDER — AZITHROMYCIN 250 MG PO TABS
250.0000 mg | ORAL_TABLET | Freq: Every day | ORAL | 0 refills | Status: DC
Start: 2019-08-01 — End: 2020-10-07

## 2019-08-01 NOTE — Discharge Instructions (Signed)
You were seen in the emergency department for various complaints including difficulty passing urine and abdominal pain.  You had a CAT scan that showed that you were constipated and also that your prostate was enlarged and causing some difficulty passing urine.  The CAT scan also showed signs of a pneumonia that you will need to go on antibiotics for.  Please follow-up with urology and your primary care doctor.  Return to the emergency department if any worsening or concerning symptoms

## 2020-10-06 ENCOUNTER — Other Ambulatory Visit: Payer: Self-pay

## 2020-10-06 ENCOUNTER — Encounter (HOSPITAL_COMMUNITY): Payer: Self-pay | Admitting: Emergency Medicine

## 2020-10-06 ENCOUNTER — Emergency Department (HOSPITAL_COMMUNITY)
Admission: EM | Admit: 2020-10-06 | Discharge: 2020-10-07 | Disposition: A | Discharge status: Home / Self Care | Payer: Medicare Other | Attending: Emergency Medicine | Admitting: Emergency Medicine

## 2020-10-06 DIAGNOSIS — M545 Low back pain, unspecified: Secondary | ICD-10-CM | POA: Diagnosis present

## 2020-10-06 DIAGNOSIS — R339 Retention of urine, unspecified: Secondary | ICD-10-CM | POA: Insufficient documentation

## 2020-10-06 DIAGNOSIS — F17210 Nicotine dependence, cigarettes, uncomplicated: Secondary | ICD-10-CM | POA: Diagnosis not present

## 2020-10-06 DIAGNOSIS — M5441 Lumbago with sciatica, right side: Secondary | ICD-10-CM | POA: Insufficient documentation

## 2020-10-06 DIAGNOSIS — M5431 Sciatica, right side: Secondary | ICD-10-CM

## 2020-10-06 DIAGNOSIS — R600 Localized edema: Secondary | ICD-10-CM | POA: Insufficient documentation

## 2020-10-06 LAB — COMPREHENSIVE METABOLIC PANEL
ALT: 11 U/L (ref 0–44)
AST: 34 U/L (ref 15–41)
Albumin: 3.9 g/dL (ref 3.5–5.0)
Alkaline Phosphatase: 54 U/L (ref 38–126)
Anion gap: 13 (ref 5–15)
BUN: 10 mg/dL (ref 8–23)
CO2: 27 mmol/L (ref 22–32)
Calcium: 9.7 mg/dL (ref 8.9–10.3)
Chloride: 107 mmol/L (ref 98–111)
Creatinine, Ser: 0.78 mg/dL (ref 0.61–1.24)
GFR, Estimated: >60 mL/min (ref >60)
Glucose, Bld: 106 mg/dL — ABNORMAL HIGH (ref 70–99)
Potassium: 4.1 mmol/L (ref 3.5–5.1)
Sodium: 147 mmol/L — ABNORMAL HIGH (ref 135–145)
Total Bilirubin: 1.6 mg/dL — ABNORMAL HIGH (ref 0.3–1.2)
Total Protein: 7.3 g/dL (ref 6.5–8.1)

## 2020-10-06 LAB — CBC WITH DIFFERENTIAL/PLATELET
Abs Immature Granulocytes: 0.05 K/uL (ref 0.00–0.07)
Basophils Absolute: 0.1 K/uL (ref 0.0–0.1)
Basophils Relative: 1 %
Eosinophils Absolute: 0.2 K/uL (ref 0.0–0.5)
Eosinophils Relative: 3 %
HCT: 45.4 % (ref 39.0–52.0)
Hemoglobin: 14.5 g/dL (ref 13.0–17.0)
Immature Granulocytes: 1 %
Lymphocytes Relative: 29 %
Lymphs Abs: 1.9 K/uL (ref 0.7–4.0)
MCH: 27.9 pg (ref 26.0–34.0)
MCHC: 31.9 g/dL (ref 30.0–36.0)
MCV: 87.5 fL (ref 80.0–100.0)
Monocytes Absolute: 0.7 K/uL (ref 0.1–1.0)
Monocytes Relative: 10 %
Neutro Abs: 3.8 K/uL (ref 1.7–7.7)
Neutrophils Relative %: 56 %
Platelets: 211 K/uL (ref 150–400)
RBC: 5.19 MIL/uL (ref 4.22–5.81)
RDW: 14.5 % (ref 11.5–15.5)
WBC: 6.6 K/uL (ref 4.0–10.5)
nRBC: 0 % (ref 0.0–0.2)

## 2020-10-06 LAB — LIPASE, BLOOD: Lipase: 32 U/L (ref 11–51)

## 2020-10-06 NOTE — ED Triage Notes (Signed)
Pt arrives with multiple complaints. Endorses right leg pain, abdominal pain, and dysuria x 4 - 5 weeks. Pain worsened today which prompted pt to want to be seen.

## 2020-10-06 NOTE — ED Provider Notes (Signed)
Emergency Medicine Provider Triage Evaluation Note  Edgar Mckay , a 85 y.o. male  was evaluated in triage.  Pt complains of urinary problems and right leg discomfort. States leg is giving out on him.  Review of Systems  Positive: Urinary sxs, leg pain Negative: Chest pain  Physical Exam  BP (!) 197/97 (BP Location: Right Arm)   Pulse 87   Temp 99.4 F (37.4 C) (Oral)   Resp 16   SpO2 100%  Gen:   Awake, no distress   Resp:  Normal effort  MSK:   Moves extremities without difficulty  Other:  Strong dp pulse to the rle, ble edema  Medical Decision Making  Medically screening exam initiated at 7:19 PM.  Appropriate orders placed.  Edgar Mckay was informed that the remainder of the evaluation will be completed by another provider, this initial triage assessment does not replace that evaluation, and the importance of remaining in the ED until their evaluation is complete.     Rayne Du 10/06/20 Edwinna Areola, MD 10/07/20 [phone]

## 2020-10-07 ENCOUNTER — Other Ambulatory Visit (HOSPITAL_COMMUNITY): Payer: Self-pay

## 2020-10-07 LAB — URINALYSIS, ROUTINE W REFLEX MICROSCOPIC
Bacteria, UA: NONE SEEN
Bilirubin Urine: NEGATIVE
Glucose, UA: NEGATIVE mg/dL
Ketones, ur: NEGATIVE mg/dL
Leukocytes,Ua: NEGATIVE
Nitrite: NEGATIVE
Protein, ur: NEGATIVE mg/dL
Specific Gravity, Urine: 1.025 (ref 1.005–1.030)
pH: 6 (ref 5.0–8.0)

## 2020-10-07 MED ORDER — HYDROCODONE-ACETAMINOPHEN 5-325 MG PO TABS
0.5000 | ORAL_TABLET | ORAL | 0 refills | Status: DC | PRN
  Filled 2020-10-07: qty 12, 4d supply, fill #0

## 2020-10-07 MED ORDER — TAMSULOSIN HCL 0.4 MG PO CAPS
0.4000 mg | ORAL_CAPSULE | Freq: Every day | ORAL | 0 refills | Status: DC
Start: 2020-10-07 — End: 2020-12-05
  Filled 2020-10-07: qty 30, 30d supply, fill #0

## 2020-10-07 MED ORDER — HYDROCODONE-ACETAMINOPHEN 5-325 MG PO TABS
0.5000 | ORAL_TABLET | Freq: Once | ORAL | Status: AC
  Administered 2020-10-07: 0.5 via ORAL
  Filled 2020-10-07: qty 1

## 2020-10-07 NOTE — ED Notes (Signed)
Warm blankets given to patient.

## 2020-10-07 NOTE — ED Notes (Signed)
Bladder scan showed in bladder.

## 2020-10-07 NOTE — ED Notes (Signed)
Repositioned cuff to right arm Pillow given Warm blanket

## 2020-10-07 NOTE — ED Provider Notes (Signed)
[facility]-EMERGENCY DEPT Provider Note: Edgar Dell, MD, FACEP  CSN: [id number] MRN: [id number] ARRIVAL: 10/06/20 at 1856 ROOM: WA06/WA06   CHIEF COMPLAINT  Leg Pain and Difficulty Urinating   HISTORY OF PRESENT ILLNESS  10/07/20 1:32 AM Edgar Mckay is a 85 y.o. male with a history of prostate trouble.  He is here with difficulty urinating for several days.  He states they "did something" to my "gland up in my butt" at some time in the past but does not know exactly what it is they did.  He states his symptoms have returned.  Specifically he is having urinary frequency and difficulty voiding.  He has also had 3 weeks of pain in his right lower back radiating down his right leg.  He localizes it to his right hip and his right knee joints and does not really have pain between the joints.  It is worse when he walks and he rates it as a 7 out of 10.  He has edema of both lower extremities but states this "comes and goes".  He denies any injury that precipitated this pain.  He walks with a cane but sometimes his leg "gives out" due to the pain.   Past Medical History:  Diagnosis Date   Urinary retention     History reviewed. No pertinent surgical history.  Family History  Family history unknown: Yes    Social History   Tobacco Use   Smoking status: Every Day    Types: Cigarettes   Smokeless tobacco: Never  Substance Use Topics   Alcohol use: Yes   Drug use: No    Prior to Admission medications   Medication Sig Start Date End Date Taking? Authorizing Provider  HYDROcodone-acetaminophen (NORCO) 5-325 MG tablet Take 0.5 tablets by mouth every 4 (four) hours as needed for severe pain (May cause constipation.). 10/07/20  Yes Lucious Zou, MD  tamsulosin (FLOMAX) 0.4 MG CAPS capsule Take 1 capsule (0.4 mg total) by mouth daily. 10/07/20   Dalena Plantz, Jonny Ruiz, MD    Allergies Patient has no known allergies.   REVIEW OF SYSTEMS  Negative except as noted here or in the History of Present  Illness.   PHYSICAL EXAMINATION  Initial Vital Signs Blood pressure (!) 193/97, pulse 78, temperature 99.4 F (37.4 C), temperature source Oral, resp. rate 18, SpO2 100 %.  Examination General: Well-developed, well-nourished male in no acute distress; appearance consistent with age of record HENT: normocephalic; atraumatic Eyes: pupils equal, round and reactive to light; extraocular muscles intact; arcus senilis bilaterally Neck: supple mu Heart: regular rate and rhythm Lungs: clear to auscultation bilaterally Abdomen: soft; nondistended; nontender; bowel sounds present Back: Positive straight leg on the right at 25 degrees Extremities: No deformity; full range of motion; 2+ edema of lower legs and feet Neurologic: Awake, alert and oriented; motor function intact in all extremities and symmetric; no facial droop Skin: Warm and dry Psychiatric: Flat affect   RESULTS  Summary of this visit's results, reviewed and interpreted by myself:   EKG Interpretation  Date/Time:    Ventricular Rate:    PR Interval:    QRS Duration:   QT Interval:    QTC Calculation:   R Axis:     Text Interpretation:         Laboratory Studies: Results for orders placed or performed during the hospital encounter of 10/06/20 (from the past 24 hour(s))  CBC with Differential     Status: None   Collection Time: 10/06/20  7:31 PM  Result Value Ref Range   WBC 6.6 4.0 - 10.5 K/uL   RBC 5.19 4.22 - 5.81 MIL/uL   Hemoglobin 14.5 13.0 - 17.0 g/dL   HCT 14.4 31.5 - 40.0 %   MCV 87.5 80.0 - 100.0 fL   MCH 27.9 26.0 - 34.0 pg   MCHC 31.9 30.0 - 36.0 g/dL   RDW 86.7 61.9 - 50.9 %   Platelets 211 150 - 400 K/uL   nRBC 0.0 0.0 - 0.2 %   Neutrophils Relative % 56 %   Neutro Abs 3.8 1.7 - 7.7 K/uL   Lymphocytes Relative 29 %   Lymphs Abs 1.9 0.7 - 4.0 K/uL   Monocytes Relative 10 %   Monocytes Absolute 0.7 0.1 - 1.0 K/uL   Eosinophils Relative 3 %   Eosinophils Absolute 0.2 0.0 - 0.5 K/uL    Basophils Relative 1 %   Basophils Absolute 0.1 0.0 - 0.1 K/uL   Immature Granulocytes 1 %   Abs Immature Granulocytes 0.05 0.00 - 0.07 K/uL  Comprehensive metabolic panel     Status: Abnormal   Collection Time: 10/06/20  7:31 PM  Result Value Ref Range   Sodium 147 (H) 135 - 145 mmol/L   Potassium 4.1 3.5 - 5.1 mmol/L   Chloride 107 98 - 111 mmol/L   CO2 27 22 - 32 mmol/L   Glucose, Bld 106 (H) 70 - 99 mg/dL   BUN 10 8 - 23 mg/dL   Creatinine, Ser 3.26 0.61 - 1.24 mg/dL   Calcium 9.7 8.9 - 71.2 mg/dL   Total Protein 7.3 6.5 - 8.1 g/dL   Albumin 3.9 3.5 - 5.0 g/dL   AST 34 15 - 41 U/L   ALT 11 0 - 44 U/L   Alkaline Phosphatase 54 38 - 126 U/L   Total Bilirubin 1.6 (H) 0.3 - 1.2 mg/dL   GFR, Estimated >45 >80 mL/min   Anion gap 13 5 - 15  Lipase, blood     Status: None   Collection Time: 10/06/20  7:31 PM  Result Value Ref Range   Lipase 32 11 - 51 U/L  Urinalysis, Routine w reflex microscopic Urine, Clean Catch     Status: Abnormal   Collection Time: 10/07/20  1:48 AM  Result Value Ref Range   Color, Urine YELLOW YELLOW   APPearance CLEAR CLEAR   Specific Gravity, Urine 1.025 1.005 - 1.030   pH 6.0 5.0 - 8.0   Glucose, UA NEGATIVE NEGATIVE mg/dL   Hgb urine dipstick TRACE (A) NEGATIVE   Bilirubin Urine NEGATIVE NEGATIVE   Ketones, ur NEGATIVE NEGATIVE mg/dL   Protein, ur NEGATIVE NEGATIVE mg/dL   Nitrite NEGATIVE NEGATIVE   Leukocytes,Ua NEGATIVE NEGATIVE   RBC / HPF 0-5 0 - 5 RBC/hpf   WBC, UA 0-5 0 - 5 WBC/hpf   Bacteria, UA NONE SEEN NONE SEEN   Squamous Epithelial / LPF 0-5 0 - 5   Mucus PRESENT    Imaging Studies: No results found.  ED COURSE and MDM  Nursing notes, initial and subsequent vitals signs, including pulse oximetry, reviewed and interpreted by myself.  Vitals:   10/07/20 0230 10/07/20 0245 10/07/20 0300 10/07/20 0315  BP: (!) 187/107 (!) 202/98 (!) 190/92 (!) 183/83  Pulse: 73 77 (!) 52 72  Resp:    16  Temp:      TempSrc:      SpO2: 97%  100% 100% 99%   Medications  HYDROcodone-acetaminophen (NORCO/VICODIN) 5-325 MG per tablet 0.5  tablet (has no administration in time range)    3:30 AM Foley catheter placed, over 400 mL of urine results.  Patient's symptoms improved.  I suspect his right leg pain is sciatica although his localization to the joints is somewhat atypical.  We will place a leg bag and have him follow-up with urology.  We will also have him follow-up with his PCP regarding his sciatica pain.  PROCEDURES  Procedures   ED DIAGNOSES     ICD-10-CM   1. Sciatica of right side  M54.31     2. Urinary retention  R33.9          Vanden Fawaz, Jonny Ruiz, MD 10/07/20 [phone]

## 2020-10-07 NOTE — ED Notes (Signed)
Changed pt into leg bag before d/c

## 2020-10-16 ENCOUNTER — Other Ambulatory Visit: Payer: Self-pay

## 2020-10-16 ENCOUNTER — Emergency Department (HOSPITAL_COMMUNITY)
Admission: EM | Admit: 2020-10-16 | Discharge: 2020-10-16 | Disposition: A | Discharge status: Home / Self Care | Payer: Medicare Other | Attending: Emergency Medicine | Admitting: Emergency Medicine

## 2020-10-16 ENCOUNTER — Encounter (HOSPITAL_COMMUNITY): Payer: Self-pay

## 2020-10-16 DIAGNOSIS — S71101A Unspecified open wound, right thigh, initial encounter: Secondary | ICD-10-CM | POA: Diagnosis not present

## 2020-10-16 DIAGNOSIS — X58XXXA Exposure to other specified factors, initial encounter: Secondary | ICD-10-CM | POA: Insufficient documentation

## 2020-10-16 DIAGNOSIS — F17210 Nicotine dependence, cigarettes, uncomplicated: Secondary | ICD-10-CM | POA: Diagnosis not present

## 2020-10-16 DIAGNOSIS — N4889 Other specified disorders of penis: Secondary | ICD-10-CM | POA: Diagnosis not present

## 2020-10-16 DIAGNOSIS — R600 Localized edema: Secondary | ICD-10-CM | POA: Diagnosis not present

## 2020-10-16 DIAGNOSIS — R339 Retention of urine, unspecified: Secondary | ICD-10-CM | POA: Insufficient documentation

## 2020-10-16 DIAGNOSIS — T83098A Other mechanical complication of other indwelling urethral catheter, initial encounter: Secondary | ICD-10-CM | POA: Insufficient documentation

## 2020-10-16 DIAGNOSIS — S79921A Unspecified injury of right thigh, initial encounter: Secondary | ICD-10-CM | POA: Diagnosis present

## 2020-10-16 DIAGNOSIS — R300 Dysuria: Secondary | ICD-10-CM

## 2020-10-16 DIAGNOSIS — T148XXA Other injury of unspecified body region, initial encounter: Secondary | ICD-10-CM

## 2020-10-16 LAB — BASIC METABOLIC PANEL
Anion gap: 8 (ref 5–15)
BUN: 11 mg/dL (ref 8–23)
CO2: 27 mmol/L (ref 22–32)
Calcium: 9.1 mg/dL (ref 8.9–10.3)
Chloride: 106 mmol/L (ref 98–111)
Creatinine, Ser: 0.88 mg/dL (ref 0.61–1.24)
GFR, Estimated: >60 mL/min (ref >60)
Glucose, Bld: 112 mg/dL — ABNORMAL HIGH (ref 70–99)
Potassium: 3.5 mmol/L (ref 3.5–5.1)
Sodium: 141 mmol/L (ref 135–145)

## 2020-10-16 LAB — CBC WITH DIFFERENTIAL/PLATELET
Abs Immature Granulocytes: 0.05 10*3/uL (ref 0.00–0.07)
Basophils Absolute: 0 10*3/uL (ref 0.0–0.1)
Basophils Relative: 1 %
Eosinophils Absolute: 0.2 10*3/uL (ref 0.0–0.5)
Eosinophils Relative: 3 %
HCT: 44.3 % (ref 39.0–52.0)
Hemoglobin: 14.3 g/dL (ref 13.0–17.0)
Immature Granulocytes: 1 %
Lymphocytes Relative: 26 %
Lymphs Abs: 1.7 10*3/uL (ref 0.7–4.0)
MCH: 28.5 pg (ref 26.0–34.0)
MCHC: 32.3 g/dL (ref 30.0–36.0)
MCV: 88.2 fL (ref 80.0–100.0)
Monocytes Absolute: 0.7 10*3/uL (ref 0.1–1.0)
Monocytes Relative: 10 %
Neutro Abs: 4.1 10*3/uL (ref 1.7–7.7)
Neutrophils Relative %: 59 %
Platelets: 195 10*3/uL (ref 150–400)
RBC: 5.02 MIL/uL (ref 4.22–5.81)
RDW: 14.6 % (ref 11.5–15.5)
WBC: 6.8 10*3/uL (ref 4.0–10.5)
nRBC: 0 % (ref 0.0–0.2)

## 2020-10-16 LAB — URINALYSIS, ROUTINE W REFLEX MICROSCOPIC

## 2020-10-16 LAB — URINALYSIS, MICROSCOPIC (REFLEX)
RBC / HPF: >50 RBC/hpf (ref 0–5)
WBC, UA: >50 WBC/hpf (ref 0–5)

## 2020-10-16 MED ORDER — CEPHALEXIN 500 MG PO CAPS
500.0000 mg | ORAL_CAPSULE | Freq: Two times a day (BID) | ORAL | 0 refills | Status: AC
Start: 2020-10-16 — End: 2020-10-23

## 2020-10-16 MED ORDER — BACITRACIN ZINC 500 UNIT/GM EX OINT
1.0000 | TOPICAL_OINTMENT | Freq: Two times a day (BID) | CUTANEOUS | 0 refills | Status: DC
Start: 2020-10-16 — End: 2020-12-09

## 2020-10-16 NOTE — ED Provider Notes (Signed)
[facility] EMERGENCY DEPARTMENT Provider Note   CSN: [id number] Arrival date & time: 10/16/20  7371     History Chief Complaint  Patient presents with   Dysuria    Graves Nipp is a 85 y.o. male.  85 year old male presents with complaint of a leaking Foley catheter and states that there is pain in his penis when this happens.  Patient states this catheter was placed about a week to week and a half ago.  He denies abdominal pain, fevers, chills.  Patient is on a fluid pill, does have lower extremity edema which he states is normal for him.  He has no other complaints or concerns today. Medical history on file includes urinary retention, prescription for Flomax.  Patient is a poor historian, states he takes other medications but he is not sure why.      Past Medical History:  Diagnosis Date   Urinary retention     There are no problems to display for this patient.   History reviewed. No pertinent surgical history.     Family History  Family history unknown: Yes    Social History   Tobacco Use   Smoking status: Every Day    Types: Cigarettes   Smokeless tobacco: Never  Substance Use Topics   Alcohol use: Yes   Drug use: No    Home Medications Prior to Admission medications   Medication Sig Start Date End Date Taking? Authorizing Provider  HYDROcodone-acetaminophen (NORCO) 5-325 MG tablet Take 1/2 tablets by mouth every 4 hours as needed for severe pain (May cause constipation.). 10/07/20   Molpus, John, MD  tamsulosin (FLOMAX) 0.4 MG CAPS capsule Take 1 capsule by mouth daily. 10/07/20   Molpus, Jonny Ruiz, MD    Allergies    Patient has no known allergies.  Review of Systems   Review of Systems  Constitutional:  Negative for chills and fever.  Respiratory:  Negative for shortness of breath.   Cardiovascular:  Negative for chest pain.  Gastrointestinal:  Negative for abdominal pain, nausea and vomiting.  Genitourinary:  Positive for difficulty  urinating and dysuria.  Musculoskeletal:  Negative for arthralgias and myalgias.  Skin:  Positive for wound.  Allergic/Immunologic: Negative for immunocompromised state.  Neurological:  Negative for weakness.  All other systems reviewed and are negative.  Physical Exam Updated Vital Signs BP (!) 172/92   Pulse (!) 103   Temp 98.7 F (37.1 C)   Resp 14   Ht 5\' 9"  (1.753 m)   Wt 94.8 kg   SpO2 100%   BMI 30.86 kg/m   Physical Exam Vitals and nursing note reviewed. Exam conducted with a chaperone present.  Constitutional:      General: He is not in acute distress.    Appearance: He is well-developed. He is not diaphoretic.  HENT:     Head: Normocephalic and atraumatic.  Cardiovascular:     Pulses: Normal pulses.  Pulmonary:     Effort: Pulmonary effort is normal.  Abdominal:     Palpations: Abdomen is soft.     Tenderness: There is no abdominal tenderness.  Genitourinary:    Penis: Uncircumcised.   Musculoskeletal:     Right lower leg: Edema present.     Left lower leg: Edema present.  Skin:    General: Skin is warm and dry.     Comments: Leg bag in place on right thigh with latex bands securing the back to the leg.  These bands appear very tight on the  patient's leg and he has subsequent skin breakdown and some small areas.  There is one area that has a trace amount of purulent drainage.  Neurological:     Mental Status: He is alert and oriented to person, place, and time.  Psychiatric:        Behavior: Behavior normal.    ED Results / Procedures / Treatments   Labs (all labs ordered are listed, but only abnormal results are displayed) Labs Reviewed  BASIC METABOLIC PANEL - Abnormal; Notable for the following components:      Result Value   Glucose, Bld 112 (*)    All other components within normal limits  URINE CULTURE  CBC WITH DIFFERENTIAL/PLATELET  URINALYSIS, ROUTINE W REFLEX MICROSCOPIC    EKG None  Radiology No results  found.  Procedures Procedures   Medications Ordered in ED Medications - No data to display  ED Course  I have reviewed the triage vital signs and the nursing notes.  Pertinent labs & imaging results that were available during my care of the patient were reviewed by me and considered in my medical decision making (see chart for details).  Clinical Course as of 10/16/20 1508  Fri Oct 16, 2020  [id number] 85 year old male with complaint of painful urination with foley in place. Also found to have skin break down with possible secondary infection from his leg bag straps. Foley replaced with clean sample sent for UA. CBC and BMP without significant findings.  Care signed out pending UA, may need topical vs oral antibiotics for leg wounds.  [LM]    Clinical Course User Index [LM] Alden Hipp   MDM Rules/Calculators/A&P                           Final Clinical Impression(s) / ED Diagnoses Final diagnoses:  Dysuria  Open wound of skin    Rx / DC Orders ED Discharge Orders     None        Jeannie Fend, PA-C 10/16/20 1508    Alvira Monday, MD 10/17/20 0830

## 2020-10-16 NOTE — ED Triage Notes (Signed)
PER EMS: pt is from home with c/o dysuria; has a chronic foley catheter. A&OX4. 97.2, BP-150/98, HR-76, RR-14, 97% RA

## 2020-10-16 NOTE — ED Provider Notes (Signed)
Emergency Medicine Provider Triage Evaluation Note  Edgar Mckay , a 85 y.o. male  was evaluated in triage.  Pt complains of dysuria, foley malfunction.  Review of Systems  Positive: dysuria Negative: fever  Physical Exam  BP (!) 164/89 (BP Location: Right Arm)   Pulse 71   Temp 98.3 F (36.8 C) (Oral)   Resp 16   Ht 5\' 9"  (1.753 m)   Wt 94.8 kg   SpO2 98%   BMI 30.86 kg/m  Gen:   Awake, no distress   Resp:  Normal effort  MSK:   Moves extremities without difficulty  Other:    Medical Decision Making  Medically screening exam initiated at 7:11 AM.  Appropriate orders placed.  Edgar Mckay was informed that the remainder of the evaluation will be completed by another provider, this initial triage assessment does not replace that evaluation, and the importance of remaining in the ED until their evaluation is complete.     Vida Rigger, PA-C 10/16/20 12/16/20    0092, MD 10/17/20 0830

## 2020-10-16 NOTE — ED Provider Notes (Signed)
Patient signed out to me awaiting urinalysis.  Foley catheter was exchanged.  His Foley catheter was having issues.  New catheter was placed without any issues.  He has superficial wound to his right leg from his previous catheter bag that was strapped onto tight.  Will prescribe bacitracin for this.  Urinalysis is overall complicated to evaluate due to how turbid it is.  We will prophylactically treat with Keflex.  Urine culture has been sent.  He has no fever.  No white count.  Appears well.  Discharged in good condition.  This chart was dictated using voice recognition software.  Despite best efforts to proofread,  errors can occur which can change the documentation meaning.    Virgina Norfolk, DO 10/16/20 1558

## 2020-10-17 ENCOUNTER — Emergency Department (HOSPITAL_COMMUNITY)
Admission: EM | Admit: 2020-10-17 | Discharge: 2020-10-17 | Disposition: A | Discharge status: Home / Self Care | Payer: Medicare Other | Attending: Emergency Medicine | Admitting: Emergency Medicine

## 2020-10-17 ENCOUNTER — Encounter (HOSPITAL_COMMUNITY): Payer: Self-pay | Admitting: Emergency Medicine

## 2020-10-17 ENCOUNTER — Other Ambulatory Visit: Payer: Self-pay

## 2020-10-17 DIAGNOSIS — T839XXA Unspecified complication of genitourinary prosthetic device, implant and graft, initial encounter: Secondary | ICD-10-CM

## 2020-10-17 DIAGNOSIS — F17210 Nicotine dependence, cigarettes, uncomplicated: Secondary | ICD-10-CM | POA: Insufficient documentation

## 2020-10-17 DIAGNOSIS — T83198A Other mechanical complication of other urinary devices and implants, initial encounter: Secondary | ICD-10-CM | POA: Diagnosis present

## 2020-10-17 DIAGNOSIS — N3001 Acute cystitis with hematuria: Secondary | ICD-10-CM | POA: Diagnosis not present

## 2020-10-17 DIAGNOSIS — D72829 Elevated white blood cell count, unspecified: Secondary | ICD-10-CM | POA: Diagnosis not present

## 2020-10-17 DIAGNOSIS — Y846 Urinary catheterization as the cause of abnormal reaction of the patient, or of later complication, without mention of misadventure at the time of the procedure: Secondary | ICD-10-CM | POA: Diagnosis not present

## 2020-10-17 LAB — CBC WITH DIFFERENTIAL/PLATELET
Abs Immature Granulocytes: 0.03 10*3/uL (ref 0.00–0.07)
Basophils Absolute: 0.1 10*3/uL (ref 0.0–0.1)
Basophils Relative: 1 %
Eosinophils Absolute: 0.2 10*3/uL (ref 0.0–0.5)
Eosinophils Relative: 2 %
HCT: 44.5 % (ref 39.0–52.0)
Hemoglobin: 14.3 g/dL (ref 13.0–17.0)
Immature Granulocytes: 0 %
Lymphocytes Relative: 20 %
Lymphs Abs: 2 10*3/uL (ref 0.7–4.0)
MCH: 28.2 pg (ref 26.0–34.0)
MCHC: 32.1 g/dL (ref 30.0–36.0)
MCV: 87.8 fL (ref 80.0–100.0)
Monocytes Absolute: 1 10*3/uL (ref 0.1–1.0)
Monocytes Relative: 10 %
Neutro Abs: 6.6 10*3/uL (ref 1.7–7.7)
Neutrophils Relative %: 67 %
Platelets: 199 10*3/uL (ref 150–400)
RBC: 5.07 MIL/uL (ref 4.22–5.81)
RDW: 14.5 % (ref 11.5–15.5)
WBC: 9.9 10*3/uL (ref 4.0–10.5)
nRBC: 0 % (ref 0.0–0.2)

## 2020-10-17 LAB — URINALYSIS, ROUTINE W REFLEX MICROSCOPIC
Bilirubin Urine: NEGATIVE
Glucose, UA: NEGATIVE mg/dL
Ketones, ur: 5 mg/dL — AB
Nitrite: NEGATIVE
Protein, ur: 100 mg/dL — AB
RBC / HPF: >50 RBC/hpf — ABNORMAL HIGH (ref 0–5)
Specific Gravity, Urine: 1.02 (ref 1.005–1.030)
WBC, UA: >50 WBC/hpf — ABNORMAL HIGH (ref 0–5)
pH: 7 (ref 5.0–8.0)

## 2020-10-17 LAB — BASIC METABOLIC PANEL
Anion gap: 5 (ref 5–15)
BUN: 10 mg/dL (ref 8–23)
CO2: 27 mmol/L (ref 22–32)
Calcium: 9 mg/dL (ref 8.9–10.3)
Chloride: 106 mmol/L (ref 98–111)
Creatinine, Ser: 0.69 mg/dL (ref 0.61–1.24)
GFR, Estimated: >60 mL/min (ref >60)
Glucose, Bld: 110 mg/dL — ABNORMAL HIGH (ref 70–99)
Potassium: 4.1 mmol/L (ref 3.5–5.1)
Sodium: 138 mmol/L (ref 135–145)

## 2020-10-17 MED ORDER — CEPHALEXIN 250 MG PO CAPS
1000.0000 mg | ORAL_CAPSULE | Freq: Once | ORAL | Status: AC
  Administered 2020-10-17: 1000 mg via ORAL
  Filled 2020-10-17: qty 4

## 2020-10-17 NOTE — ED Triage Notes (Addendum)
Pt to triage via [facility].  Reports blood in foley that started today.  Seen in ED yesterday for dysuria.    States she is also having penile pain.

## 2020-10-17 NOTE — ED Notes (Signed)
[employer], PA with examining pts catheter. Pt states that they did not pull his foreskin back in place after inserting foley. He reports pain. Penis is noted to be red and swollen.

## 2020-10-17 NOTE — ED Provider Notes (Signed)
Emergency Medicine Provider Triage Evaluation Note  Edgar Mckay , a 85 y.o. male  was evaluated in triage.  Pt complains of blood in Foley.  This was placed yesterday at [facility] long ED.  Review of Systems  Positive: hematuria Negative: Fever  Physical Exam  There were no vitals taken for this visit. Gen:   Awake, no distress   Resp:  Normal effort  MSK:   Moves extremities without difficulty  Other:    Medical Decision Making  Medically screening exam initiated at 12:38 PM.  Appropriate orders placed.  Edgar Mckay was informed that the remainder of the evaluation will be completed by another provider, this initial triage assessment does not replace that evaluation, and the importance of remaining in the ED until their evaluation is complete.     Claude Manges, PA-C 10/17/20 1241    Gerhard Munch, MD 10/17/20 [phone]

## 2020-10-17 NOTE — Discharge Instructions (Signed)
Follow-up if you begin to notice bright red blood, blood clots in your Foley bag, if you have difficulty draining, you develop a fever.  I recommend that you follow-up with urology.  I have attached the contact information of a urologist that you should call at your earliest convenience.  You can take Tylenol or ibuprofen as needed for pain.  Please take the entire course of antibiotics that you are prescribed yesterday, please pick them up from the pharmacy at your earliest convenience.

## 2020-10-17 NOTE — ED Provider Notes (Signed)
[facility] EMERGENCY DEPARTMENT Provider Note   CSN: [id number] Arrival date & time: 10/17/20  1217     History No chief complaint on file.   Edgar Mckay is a 85 y.o. male with a past medical history significant for indwelling Foley catheter for the last year to 2 years who presents with penile pain, bloody urine, paraphimosis after being discharged yesterday with complaints of similar.  Patient had his Foley catheter exchanged yesterday, had urinalysis sent off.  Patient was started on Keflex, however has not picked up this antibiotic from the pharmacy yet.  Patient denies fever, abdominal pain, nausea, vomiting, chest pain, shortness of breath.  Patient has lower leg edema, does report he takes some sort of fluid pill.  HPI     Past Medical History:  Diagnosis Date   Urinary retention     There are no problems to display for this patient.   History reviewed. No pertinent surgical history.     Family History  Family history unknown: Yes    Social History   Tobacco Use   Smoking status: Every Day    Types: Cigarettes   Smokeless tobacco: Never  Substance Use Topics   Alcohol use: Yes   Drug use: No    Home Medications Prior to Admission medications   Medication Sig Start Date End Date Taking? Authorizing Provider  bacitracin ointment Apply 1 application topically 2 (two) times daily. Places on your right leg wound for the next 7 to 10 days 10/16/20   Curatolo, Adam, DO  cephALEXin (KEFLEX) 500 MG capsule Take 1 capsule (500 mg total) by mouth 2 (two) times daily for 7 days. 10/16/20 10/23/20  Virgina Norfolk, DO  HYDROcodone-acetaminophen (NORCO) 5-325 MG tablet Take 1/2 tablets by mouth every 4 hours as needed for severe pain (May cause constipation.). 10/07/20   Molpus, John, MD  tamsulosin (FLOMAX) 0.4 MG CAPS capsule Take 1 capsule by mouth daily. 10/07/20   Molpus, Jonny Ruiz, MD    Allergies    Patient has no known allergies.  Review of Systems    Review of Systems  Genitourinary:  Positive for dysuria, penile pain and penile swelling.  All other systems reviewed and are negative.  Physical Exam Updated Vital Signs BP (!) 180/89   Pulse 76   Temp 98.8 F (37.1 C) (Oral)   Resp 17   SpO2 100%   Physical Exam Vitals and nursing note reviewed.  Constitutional:      General: He is not in acute distress.    Appearance: Normal appearance.  HENT:     Head: Normocephalic and atraumatic.  Eyes:     General:        Right eye: No discharge.        Left eye: No discharge.  Cardiovascular:     Rate and Rhythm: Normal rate and regular rhythm.     Heart sounds: No murmur heard.   No friction rub. No gallop.  Pulmonary:     Effort: Pulmonary effort is normal.     Breath sounds: Normal breath sounds.  Abdominal:     General: Bowel sounds are normal.     Palpations: Abdomen is soft.  Genitourinary:    Comments: Red swollen glans penis with evidence of paraphimosis, Foley catheter is in place and draining without evidence of leakage. Musculoskeletal:     Comments: 2+ edema in bilateral lower extremities.  Skin:    General: Skin is warm and dry.     Capillary Refill:  Capillary refill takes less than 2 seconds.     Comments: Several areas of macerated skin on the upper thighs from Foley bag, some purulent drainage on right side.  Minimally tender to palpation.  Neurological:     Mental Status: He is alert and oriented to person, place, and time.  Psychiatric:        Mood and Affect: Mood normal.        Behavior: Behavior normal.    ED Results / Procedures / Treatments   Labs (all labs ordered are listed, but only abnormal results are displayed) Labs Reviewed  BASIC METABOLIC PANEL - Abnormal; Notable for the following components:      Result Value   Glucose, Bld 110 (*)    All other components within normal limits  URINALYSIS, ROUTINE W REFLEX MICROSCOPIC - Abnormal; Notable for the following components:   Color, Urine  AMBER (*)    APPearance HAZY (*)    Hgb urine dipstick LARGE (*)    Ketones, ur 5 (*)    Protein, ur 100 (*)    Leukocytes,Ua LARGE (*)    RBC / HPF >50 (*)    WBC, UA >50 (*)    Bacteria, UA MANY (*)    All other components within normal limits  CBC WITH DIFFERENTIAL/PLATELET    EKG None  Radiology No results found.  Procedures Procedures   Medications Ordered in ED Medications  cephALEXin (KEFLEX) capsule 1,000 mg (has no administration in time range)    ED Course  I have reviewed the triage vital signs and the nursing notes.  Pertinent labs & imaging results that were available during my care of the patient were reviewed by me and considered in my medical decision making (see chart for details).    MDM Rules/Calculators/A&P                         I discussed this case with my attending physician who cosigned this note including patient's presenting symptoms, physical exam, and planned diagnostics and interventions. Attending physician stated agreement with plan or made changes to plan which were implemented.   Attending physician assessed patient at bedside.  Paraphimosis reduced by Dr. Rodena Medin at bedside.  We will flush Foley bag, rerun labs given ongoing bleeding.  If patient continuing to have frank blood in Foley bag, especially with decreased hemoglobin we will discuss continuous bladder irrigation, urgent cystoscopy, urology consult and admit.  Urinalysis reveals hazy amber color urine with large hemoglobin, large leukocytes and many bacteria.  Consistent with urinary tract infection as discussed yesterday.  Patient has had no change in hemoglobin since yesterday.  Urine in Foley bag after flush is slight reddish tint without frank blood or clots. Draining without difficulty.  Based on appearance, believe that patient could tolerate outpatient antibiotics and discharge.   Extensive return precautions given.  Discharged in stable condition.  Given a dose of Keflex  prior to discharge.  Encouraged to pick up his antibiotics from the pharmacy, and follow-up with urology. Final Clinical Impression(s) / ED Diagnoses Final diagnoses:  Acute cystitis with hematuria  Problem with Foley catheter, initial encounter [facility])    Rx / DC Orders ED Discharge Orders     None        [place] 10/17/20 1815    Wynetta Fines, MD 10/19/20 2317

## 2020-10-19 LAB — URINE CULTURE: Culture: >=100000 — AB

## 2020-10-20 ENCOUNTER — Telehealth: Payer: Self-pay | Admitting: *Deleted

## 2020-10-20 ENCOUNTER — Other Ambulatory Visit (HOSPITAL_COMMUNITY): Payer: Self-pay

## 2020-10-20 NOTE — Telephone Encounter (Signed)
Post ED Visit - Positive Culture Follow-up: Unsuccessful Patient Follow-up  Culture assessed and recommendations reviewed by:  []  , Pharm.D. []  Enzo Bi, Pharm.D., BCPS AQ-ID []  , Pharm.D., BCPS []  Celedonio Miyamoto, Pharm.D., BCPS []  [place], Garvin Fila.D., BCPS, AAHIVP []  , Pharm.D., BCPS, AAHIVP []  Georgina Pillion, PharmD []  , PharmD, BCPS  Positive urine culture  []  Patient discharged without antimicrobial prescription and treatment is now indicated [x]  Organism is resistant to prescribed ED discharge antimicrobial []  Patient with positive blood cultures  Plan:  D/C Cephalexin and start Cefdinir 300mg  PO BID x 7 days, [place], MD  Unable to contact patient after 3 attempts, letter will be sent to address on file  [address] 10/20/2020, 9:44 AM

## 2020-10-20 NOTE — Progress Notes (Signed)
ED Antimicrobial Stewardship Positive Culture Follow Up   Edgar Mckay is an 85 y.o. male who presented to [facility] on 10/17/2020 with a chief complaint of No chief complaint on file.   Recent Results (from the past 720 hour(s))  Urine Culture     Status: Abnormal   Collection Time: 10/16/20  6:57 AM   Specimen: Urine, Catheterized  Result Value Ref Range Status   Specimen Description URINE, CATHETERIZED  Final   Special Requests   Final    NONE Performed at [facility], [address] [place], [place] [address]    Culture >=100,000 COLONIES/mL PROTEUS PENNERI (A)  Final   Report Status 10/19/2020 FINAL  Final   Organism ID, Bacteria PROTEUS PENNERI (A)  Final      Susceptibility   Proteus penneri - MIC*    AMPICILLIN >=32 RESISTANT Resistant     CEFAZOLIN >=64 RESISTANT Resistant     CEFEPIME <=0.12 SENSITIVE Sensitive     CEFTRIAXONE <=0.25 SENSITIVE Sensitive     CIPROFLOXACIN <=0.25 SENSITIVE Sensitive     GENTAMICIN <=1 SENSITIVE Sensitive     IMIPENEM 2 SENSITIVE Sensitive     NITROFURANTOIN 128 RESISTANT Resistant     TRIMETH/SULFA <=20 SENSITIVE Sensitive     AMPICILLIN/SULBACTAM 16 INTERMEDIATE Intermediate     PIP/TAZO <=4 SENSITIVE Sensitive     * >=100,000 COLONIES/mL PROTEUS PENNERI   [x]  Treated with cephalexin, organism resistant to prescribed antimicrobial []  Patient discharged originally without antimicrobial agent and treatment is now indicated  New antibiotic prescription: Discontinue cephalexin. Prescribe cefdinir 300mg  PO BID x 7 days. Ensure patient has urology follow up appt. scheduled.  ED Provider: , MD  , PharmD, [facility] Emergency Medicine Clinical Pharmacist ED RPh Phone: [phone] Main RX: [phone]

## 2020-11-30 ENCOUNTER — Emergency Department (HOSPITAL_COMMUNITY): Payer: Medicare Other

## 2020-11-30 ENCOUNTER — Inpatient Hospital Stay (HOSPITAL_COMMUNITY)
Admission: EM | Admit: 2020-11-30 | Discharge: 2020-12-09 | DRG: 698 | Disposition: A | Discharge status: Skilled Nursing Facility | Payer: Medicare Other | Attending: Family Medicine | Admitting: Family Medicine

## 2020-11-30 DIAGNOSIS — R339 Retention of urine, unspecified: Secondary | ICD-10-CM | POA: Diagnosis not present

## 2020-11-30 DIAGNOSIS — M6281 Muscle weakness (generalized): Secondary | ICD-10-CM | POA: Diagnosis not present

## 2020-11-30 DIAGNOSIS — Z6830 Body mass index (BMI) 30.0-30.9, adult: Secondary | ICD-10-CM

## 2020-11-30 DIAGNOSIS — N3001 Acute cystitis with hematuria: Secondary | ICD-10-CM | POA: Diagnosis present

## 2020-11-30 DIAGNOSIS — E538 Deficiency of other specified B group vitamins: Secondary | ICD-10-CM | POA: Diagnosis present

## 2020-11-30 DIAGNOSIS — A530 Latent syphilis, unspecified as early or late: Secondary | ICD-10-CM | POA: Diagnosis present

## 2020-11-30 DIAGNOSIS — T83091D Other mechanical complication of indwelling urethral catheter, subsequent encounter: Secondary | ICD-10-CM | POA: Diagnosis not present

## 2020-11-30 DIAGNOSIS — I878 Other specified disorders of veins: Secondary | ICD-10-CM | POA: Diagnosis present

## 2020-11-30 DIAGNOSIS — R54 Age-related physical debility: Secondary | ICD-10-CM | POA: Diagnosis present

## 2020-11-30 DIAGNOSIS — L03818 Cellulitis of other sites: Secondary | ICD-10-CM | POA: Diagnosis present

## 2020-11-30 DIAGNOSIS — Z751 Person awaiting admission to adequate facility elsewhere: Secondary | ICD-10-CM | POA: Diagnosis not present

## 2020-11-30 DIAGNOSIS — T83091A Other mechanical complication of indwelling urethral catheter, initial encounter: Principal | ICD-10-CM | POA: Diagnosis present

## 2020-11-30 DIAGNOSIS — Z20822 Contact with and (suspected) exposure to covid-19: Secondary | ICD-10-CM | POA: Diagnosis present

## 2020-11-30 DIAGNOSIS — N401 Enlarged prostate with lower urinary tract symptoms: Secondary | ICD-10-CM | POA: Diagnosis present

## 2020-11-30 DIAGNOSIS — F0390 Unspecified dementia without behavioral disturbance: Secondary | ICD-10-CM | POA: Diagnosis present

## 2020-11-30 DIAGNOSIS — R310 Gross hematuria: Secondary | ICD-10-CM | POA: Diagnosis present

## 2020-11-30 DIAGNOSIS — R338 Other retention of urine: Secondary | ICD-10-CM | POA: Diagnosis present

## 2020-11-30 DIAGNOSIS — Z8249 Family history of ischemic heart disease and other diseases of the circulatory system: Secondary | ICD-10-CM

## 2020-11-30 DIAGNOSIS — F17210 Nicotine dependence, cigarettes, uncomplicated: Secondary | ICD-10-CM | POA: Diagnosis present

## 2020-11-30 DIAGNOSIS — E46 Unspecified protein-calorie malnutrition: Secondary | ICD-10-CM | POA: Diagnosis present

## 2020-11-30 DIAGNOSIS — R627 Adult failure to thrive: Secondary | ICD-10-CM | POA: Diagnosis present

## 2020-11-30 DIAGNOSIS — T7601XA Adult neglect or abandonment, suspected, initial encounter: Secondary | ICD-10-CM | POA: Diagnosis present

## 2020-11-30 DIAGNOSIS — Z66 Do not resuscitate: Secondary | ICD-10-CM | POA: Diagnosis present

## 2020-11-30 DIAGNOSIS — Z79899 Other long term (current) drug therapy: Secondary | ICD-10-CM | POA: Diagnosis not present

## 2020-11-30 DIAGNOSIS — I10 Essential (primary) hypertension: Secondary | ICD-10-CM | POA: Diagnosis present

## 2020-11-30 DIAGNOSIS — K5900 Constipation, unspecified: Secondary | ICD-10-CM | POA: Diagnosis not present

## 2020-11-30 DIAGNOSIS — I639 Cerebral infarction, unspecified: Secondary | ICD-10-CM | POA: Diagnosis not present

## 2020-11-30 DIAGNOSIS — Z659 Problem related to unspecified psychosocial circumstances: Secondary | ICD-10-CM

## 2020-11-30 DIAGNOSIS — F1010 Alcohol abuse, uncomplicated: Secondary | ICD-10-CM | POA: Diagnosis present

## 2020-11-30 DIAGNOSIS — Z609 Problem related to social environment, unspecified: Secondary | ICD-10-CM

## 2020-11-30 DIAGNOSIS — R2681 Unsteadiness on feet: Secondary | ICD-10-CM | POA: Diagnosis not present

## 2020-11-30 DIAGNOSIS — R609 Edema, unspecified: Secondary | ICD-10-CM | POA: Diagnosis not present

## 2020-11-30 DIAGNOSIS — R0609 Other forms of dyspnea: Secondary | ICD-10-CM | POA: Diagnosis not present

## 2020-11-30 DIAGNOSIS — R823 Hemoglobinuria: Secondary | ICD-10-CM | POA: Diagnosis not present

## 2020-11-30 DIAGNOSIS — G459 Transient cerebral ischemic attack, unspecified: Secondary | ICD-10-CM | POA: Diagnosis not present

## 2020-11-30 DIAGNOSIS — Y738 Miscellaneous gastroenterology and urology devices associated with adverse incidents, not elsewhere classified: Secondary | ICD-10-CM | POA: Diagnosis present

## 2020-11-30 LAB — CBC WITH DIFFERENTIAL/PLATELET
Abs Immature Granulocytes: 0.04 10*3/uL (ref 0.00–0.07)
Basophils Absolute: 0.1 10*3/uL (ref 0.0–0.1)
Basophils Relative: 1 %
Eosinophils Absolute: 0.1 10*3/uL (ref 0.0–0.5)
Eosinophils Relative: 2 %
HCT: 43.2 % (ref 39.0–52.0)
Hemoglobin: 13.9 g/dL (ref 13.0–17.0)
Immature Granulocytes: 1 %
Lymphocytes Relative: 25 %
Lymphs Abs: 2.2 10*3/uL (ref 0.7–4.0)
MCH: 28.3 pg (ref 26.0–34.0)
MCHC: 32.2 g/dL (ref 30.0–36.0)
MCV: 88 fL (ref 80.0–100.0)
Monocytes Absolute: 0.8 10*3/uL (ref 0.1–1.0)
Monocytes Relative: 10 %
Neutro Abs: 5.5 10*3/uL (ref 1.7–7.7)
Neutrophils Relative %: 61 %
Platelets: 219 10*3/uL (ref 150–400)
RBC: 4.91 MIL/uL (ref 4.22–5.81)
RDW: 15.7 % — ABNORMAL HIGH (ref 11.5–15.5)
WBC: 8.8 10*3/uL (ref 4.0–10.5)
nRBC: 0 % (ref 0.0–0.2)

## 2020-11-30 LAB — BASIC METABOLIC PANEL
Anion gap: 9 (ref 5–15)
BUN: 11 mg/dL (ref 8–23)
CO2: 27 mmol/L (ref 22–32)
Calcium: 9 mg/dL (ref 8.9–10.3)
Chloride: 107 mmol/L (ref 98–111)
Creatinine, Ser: 0.91 mg/dL (ref 0.61–1.24)
GFR, Estimated: >60 mL/min (ref >60)
Glucose, Bld: 113 mg/dL — ABNORMAL HIGH (ref 70–99)
Potassium: 3.6 mmol/L (ref 3.5–5.1)
Sodium: 143 mmol/L (ref 135–145)

## 2020-11-30 LAB — URINALYSIS, ROUTINE W REFLEX MICROSCOPIC
Bilirubin Urine: NEGATIVE
Glucose, UA: NEGATIVE mg/dL
Ketones, ur: NEGATIVE mg/dL
Nitrite: NEGATIVE
Protein, ur: 100 mg/dL — AB
RBC / HPF: >50 RBC/hpf — ABNORMAL HIGH (ref 0–5)
RBC / HPF: >50 RBC/hpf — ABNORMAL HIGH (ref 0–5)
Specific Gravity, Urine: 1.045 — ABNORMAL HIGH (ref 1.005–1.030)
WBC, UA: >50 WBC/hpf — ABNORMAL HIGH (ref 0–5)
pH: 5 (ref 5.0–8.0)

## 2020-11-30 LAB — RESP PANEL BY RT-PCR (FLU A&B, COVID) ARPGX2
Influenza A by PCR: NEGATIVE
Influenza B by PCR: NEGATIVE
SARS Coronavirus 2 by RT PCR: NEGATIVE

## 2020-11-30 LAB — LACTIC ACID, PLASMA: Lactic Acid, Venous: 1.9 mmol/L (ref 0.5–1.9)

## 2020-11-30 LAB — BRAIN NATRIURETIC PEPTIDE: B Natriuretic Peptide: 5.6 pg/mL (ref 0.0–100.0)

## 2020-11-30 MED ORDER — ACETAMINOPHEN 650 MG RE SUPP: 650.0000 mg | Freq: Four times a day (QID) | RECTAL | Status: DC | PRN

## 2020-11-30 MED ORDER — PIPERACILLIN-TAZOBACTAM 3.375 G IVPB 30 MIN
3.3750 g | Freq: Once | INTRAVENOUS | Status: AC
  Administered 2020-11-30: 3.375 g via INTRAVENOUS
  Filled 2020-11-30: qty 50

## 2020-11-30 MED ORDER — NICOTINE 14 MG/24HR TD PT24: 14.0000 mg | MEDICATED_PATCH | TRANSDERMAL | Status: DC

## 2020-11-30 MED ORDER — ENOXAPARIN SODIUM 40 MG/0.4ML IJ SOSY
40.0000 mg | PREFILLED_SYRINGE | INTRAMUSCULAR | Status: DC
  Administered 2020-11-30 – 2020-12-08 (×9): 40 mg via SUBCUTANEOUS
  Filled 2020-11-30 (×9): qty 0.4

## 2020-11-30 MED ORDER — NICOTINE 21 MG/24HR TD PT24
21.0000 mg | MEDICATED_PATCH | TRANSDERMAL | Status: DC
  Administered 2020-11-30 – 2020-12-08 (×9): 21 mg via TRANSDERMAL
  Filled 2020-11-30 (×9): qty 1

## 2020-11-30 MED ORDER — THIAMINE HCL 100 MG/ML IJ SOLN
100.0000 mg | Freq: Once | INTRAMUSCULAR | Status: AC
  Administered 2020-11-30: 100 mg via INTRAMUSCULAR
  Filled 2020-11-30: qty 2

## 2020-11-30 MED ORDER — ADULT MULTIVITAMIN W/MINERALS CH
1.0000 | ORAL_TABLET | Freq: Every day | ORAL | Status: DC
  Administered 2020-11-30 – 2020-12-08 (×8): 1 via ORAL
  Filled 2020-11-30 (×8): qty 1

## 2020-11-30 MED ORDER — MORPHINE SULFATE (PF) 4 MG/ML IV SOLN
4.0000 mg | Freq: Once | INTRAVENOUS | Status: AC
  Administered 2020-11-30: 4 mg via INTRAVENOUS
  Filled 2020-11-30: qty 1

## 2020-11-30 MED ORDER — FOLIC ACID 1 MG PO TABS
1.0000 mg | ORAL_TABLET | Freq: Every day | ORAL | Status: DC
  Administered 2020-12-01 – 2020-12-08 (×7): 1 mg via ORAL
  Filled 2020-11-30 (×7): qty 1

## 2020-11-30 MED ORDER — THIAMINE HCL 100 MG PO TABS
100.0000 mg | ORAL_TABLET | Freq: Every day | ORAL | Status: DC
  Administered 2020-12-01 – 2020-12-08 (×7): 100 mg via ORAL
  Filled 2020-11-30 (×7): qty 1

## 2020-11-30 MED ORDER — POLYETHYLENE GLYCOL 3350 17 G PO PACK: 17.0000 g | PACK | Freq: Every day | ORAL | Status: DC | PRN

## 2020-11-30 MED ORDER — ACETAMINOPHEN 325 MG PO TABS
650.0000 mg | ORAL_TABLET | Freq: Four times a day (QID) | ORAL | Status: DC | PRN
  Administered 2020-12-01 – 2020-12-07 (×6): 650 mg via ORAL
  Filled 2020-11-30 (×6): qty 2

## 2020-11-30 MED ORDER — VANCOMYCIN HCL 2000 MG/400ML IV SOLN
2000.0000 mg | Freq: Once | INTRAVENOUS | Status: AC
  Administered 2020-11-30: 2000 mg via INTRAVENOUS
  Filled 2020-11-30: qty 400

## 2020-11-30 MED ORDER — IOHEXOL 300 MG/ML  SOLN
100.0000 mL | Freq: Once | INTRAMUSCULAR | Status: AC | PRN
  Administered 2020-11-30: 100 mL via INTRAVENOUS

## 2020-11-30 NOTE — ED Triage Notes (Signed)
Pt here d/t indwelling cathheter not functioning corretly. Pt states urine is not going into the tube, rather all over him. Pt endorses penis pain and increase bilateral leg swelling. Pt lives alone.

## 2020-11-30 NOTE — H&P (Addendum)
[facility] Admission History and Physical Service Pager: [phone]  Patient name: Edgar Mckay Medical record number: [id number] Date of birth: 1935/11/09 Age: 85 y.o. Gender: male  Primary Care Provider: Pcp, No Consultants: None Code Status: DNR  Preferred Emergency Contact: Oldest son, Boe Goffredo. Is primary contact: [phone] Dhruv Gusmano, youngest son: [phone] Chief Complaint: Obstructed indwelling catheter and surrounding macerated skin on inner thigh  Assessment and Plan: Edgar Mckay is a 85 y.o. male presenting with obstructed Foley catheter and surrounding macerated skin around scrotum and groin-admitted due to concern for cellulitis and UTI. PMHx is significant for urinary retention with foley (02/2019), BPH, hx of paraphimosis s/p reduction (04/2019), and no other history (no PCP for 38yrs).  Obstructed indwelling catheter  Urinary retention  BPH  This is patient's third visit for the same problem, last being ED visit 10/8 where they exchanged the catheter. Patient has had an indwelling Foley catheter since 02/2019 after urinary retention 2/2 BPH, then would come to the ED to have it replaced. Patient was supposed to have close PCP and urology follow-up, however, was not able to follow through as patient relies on his oldest son to drive him places.  Per chart, patient has been on tamsulosin 0.4 mg in the past, and reported taking some pill at home. In the ED, there was difficulty removing the foley, and it was found to be in his prostate rather than bladder on CT. The catheter has been replaced in the ED and is draining. However patient continues to endorse suprapubic tenderness and fullness concerning for urinary retention.   Admit to [facility], MedSurg, attending Dr. Marlane Mingle per floor protocol Heart healthy diet Delirium precaution Up with assistance PT/OT eval and treat Fall precautions Strict I's and O's Bladder scans  q8hours Restart tamsulosin 0.4 mg Consider voiding trial  Consider Urology consult in AM  Urinary Tract Infection  Hematuria 10/8 patient was D/C'd from [facility] with Keflex BID x7 days for the same presentation. Per chart review, appears that urine culture at that time grew greater than 100,000 colonies of Proteus penneri that was resistant to ampicillin, cefazolin, nitrofurantoin and had intermediate sensitivity to Unasyn.  Also has a history of pansensitive E. coli UTI in April 2021.  Patient reported suprapubic tenderness, dysuria, urinary frequency, and urinary urgency suspected 2/2 indwelling catheter.  He appears very unkempt, thus suspicion for UTI is high.  Initial UA was limited due to urine pigment interference, but did reveal RBC >50 and few bacteria.  Repeat UA notable for large leukocytes, greater than 50 WBCs, greater than 50 RBCs, few bacteria, nitrite negative, 100 protein. CT showed diffuse bladder wall thickening. 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 There is concern for potential malignancy given his extensive tobacco use history and lack of follow-up with recurrent symptomatic urinary obstruction.  Follow-up urine culture and sensitivities Start Rocephin (11/21), plan for 10-day course; can adjust abx once sensitivities result Urology consult in AM; patient has several social barriers and would likely have poor f/u outpatient  Cellulitis vs. macerated skin Located on bilateral, medial thighs, scrotum, and inguinal folds suspected 2/2 damp clothes and inability to care for self hygiene. Affected areas are red, shiny, and tender to palpation (see pictures below). Reassuringly, afebrile and no leukocytosis.  S/p IV Zosyn and IV vancomycin in the ED Wound care Daily Hygiene  Rocephin as above   Lower Extremity Edema Reports that he has had lower extremity edema  for the last year. He denies any heart history, though has not seen a doctor in many years.  He  certainly has risk factors for heart failure including hypertension, tobacco and alcohol abuse.  Given that he is mostly sedentary, there is concern for DVT due to the swelling and tenderness of his lower legs.  BNP was reassuringly normal at 5.6.  He is breathing comfortably on room air, lungs are clear- doubt CHF exacerbation at this time.  His mucous membranes also appear dry so will hold off on diuresis at this time.  Follow-up bilateral vascular ultrasound to assess DVT Lovenox for VTE ppx Follow-up echocardiogram  Penile lesion  Uncircumcised  Hx of Paraphimosis Presented for paraphimosis in 04/2019 and was successfully reduced in the ED after multiple attempts. Patient had indurated, white lesions x2 under foreskin and on L inner thigh that was nonpainful to palpation concerning for potential STI. Patient denied recent sexual activity, but he admitted remote hx of STI. It is unclear if ever treated. Follow-up HIV, RPR, G/C, Trichomonas, Candida, HSV  Poor social determinant of health  Concern for possible neglect  On arrival, patient was covered in urine and feces. Stated that he is unable to bathe himself. Reported that his eldest son comes and visits him, and is responsible for helping him with doctor appointments.  This is concerning as patient has not been compliant with suggested PCP and urology outpatient follow-ups.  He is required multiple ED visits for similar symptoms.  He is very unkempt and disheveled, had feces macerated under his toenails.  He lives alone and admits that he has been isolated, there is concern for neglect.  Anticipate that he will not be safe to discharge back home and will likely need an ALF or SNF as it is clear that he is not able to care for himself at home. APS report placed TOC consulted for PCP PT/OT eval and treat Follow-up health maintenance labs: TSH, T4, Lipid panel, Hgb A1c  Deconditioning  Dyspnea Patient ambulates with a cane, however only able  to ambulate short distances.  He reports this is due to pain and shortness of breath.  Question whether dyspnea is related to his longstanding tobacco use (undiagnosed COPD) versus deconditioning versus heart failure.  On examination, lungs are clear without wheezing.  He was saturating 100% on room air.   PT/OT TOC consult as above Echocardiogram  Repeat EKG given substantial artifact on initial   Tobacco Use Disorder  Reported about one pack per day since 85 years old.  Nicotine patch 21 mg daily Encourage smoking cessation  Alcohol Use Disorder  Reported about "1/5 of wine" a week, with his last drink being the day prior to admission (Sunday 11/20). Admitted to walking un in unfamiliar places at times. He denied seizures or withdrawal symptoms.  CIWA protocol without Ativan MV + minerals, folic acid, thiamine TOC consulted for substance abuse  FEN/GI: Heart Healthy Prophylaxis: Enoxaparin (lovenox) injection 40 mg start: 11/30/20 2215   Disposition:  Pending PT/OT  History of Present Illness:  Edgar Mckay is a 85 y.o. male presenting with obstructed Foley catheter and surrounding macerated skin. PMHx is significant for urinary retention with foley (02/2019), BPH, hx of paraphimosis (04/2019), and no other history (no PCP for 20yrs).  Dominica Severin is A&Ox4, to person, place, year, situation, and has youngest son [name] at bedside.  "The catheter was stopped up". There was some bubbling at the end of the catheter.  [name] states that "All I  know, is it was in there too long".  Patient thinks that the catheter was not working right because nothing was going into the bag, rather it would leak around the Foley. This has been happening for the last 8 weeks. He was using some paper towels around the area, "to try to make it like a diaper". The skin breakdown happened about 2-3 weeks ago. Reason for coming in now, was that his youngest son visited him and brought him in today.   Patient endorsed "big  pain" before they exchanged the catheter today. Had constant pain in lower abdomen, groin and penis prior to coming in, which has now subsided.  Also endorsed dysuria, urinary frequency, suprapubic tenderness, and suprapubic fullness.  However denied fevers, chills hematuria, and flank pain.  Both of his legs are also swollen, reports they have been swollen for over a year. He has not seen anyone for this.   Takes one medication at home but isn't sure what it is called.  The last time he saw a PCP was about 30 years ago.   Patient lives alone and states, "I've been isolated for the last 10-15 years". 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 At home he ambulates with a cane. Unable to ambulate far distances 2/2 SOB and pain. Denies any falls. He is unable to bathe himself nor drive, and has to rely heavily on his oldest son Chales Salmon to drive him places and make doctor's appointments. [name] lives about 15 minutes away but is a truck driver so is always away and reported that he was unaware of patient's condition. Reported that older brother thought he had set up an appointment with a Urologist but "he didn't do it. He set it up with a regular doctor".  Patient admitted to smoking 1 PPD of tobacco since 85 years old drinking about 1/5 of wine weekly. Last drink was the day prior to admission.  He denied any other substances such as marijuana, meth, cocaine, and any IVDU.  ED course: There was concern for cellulitis versus Fournier's  gangrene.  Bilateral lower extremity edema was not thought to be due to heart failure as BNP and chest x-ray negative. Patient was started on vancomycin and Zosyn due to concern for cellulitis.  Blood cultures were obtained. UA and Urine culture collected. CT abdomen pelvis was negative for Fournier's gangrene but noted that the tip of the Foley catheter was seen within the prostate gland rather than the bladder.  There was diffuse and chronic bladder wall thickening concerning for chronic  cystitis or chronic bladder outlet obstruction.  Patient received morphine for pain relief as the Foley was replaced.   Review Of Systems: Per HPI with the following additions:   Review of Systems  Constitutional:  Negative for chills, fever and unexpected weight change.  Respiratory:  Positive for shortness of breath. Negative for cough and wheezing.   Cardiovascular:  Positive for leg swelling. Negative for chest pain and palpitations.  Gastrointestinal:  Positive for abdominal pain. Negative for blood in stool, constipation, diarrhea, nausea and vomiting.  Genitourinary:  Positive for difficulty urinating, dysuria, frequency, testicular pain (Left) and urgency. Negative for hematuria, penile pain, penile swelling and scrotal swelling.  Musculoskeletal:  Positive for back pain and gait problem.  Neurological:  Positive for weakness. Negative for dizziness, seizures and syncope.    Patient Active Problem List   Diagnosis Date Noted   Mechanical complication due to urethral (indwelling) catheter ([place]) 11/30/2020    Past Medical History: Past  Medical History:  Diagnosis Date   Urinary retention    Past Surgical History: "Had a blocked tube in his leg". He is unsure what he actually had but states it was about 40 years ago.  Social History: Social History   Tobacco Use   Smoking status: Every Day    Types: Cigarettes   Smokeless tobacco: Never  Substance Use Topics   Alcohol use: Yes   Drug use: No   Additional social history:  Lives alone. Walks with a cane. Doesn't cook, eats microwave food. No one comes to take care of him. Son drives him places. "I keep isolated". Drinks "anytime I can get it". Drinks about 1/5th of wine weekly. Smokes about 1 pack per day since the age of 85. Denies IV drug medications.  Please also refer to relevant sections of EMR.  Family History: Family History  Family history unknown: Yes  HTN.  Allergies and Medications: No Known Allergies No  current facility-administered medications on file prior to encounter.   Current Outpatient Medications on File Prior to Encounter  Medication Sig Dispense Refill   bacitracin ointment Apply 1 application topically 2 (two) times daily. Places on your right leg wound for the next 7 to 10 days 120 g 0   HYDROcodone-acetaminophen (NORCO) 5-325 MG tablet Take 1/2 tablets by mouth every 4 hours as needed for severe pain (May cause constipation.). 12 tablet 0   tamsulosin (FLOMAX) 0.4 MG CAPS capsule Take 1 capsule by mouth daily. 30 capsule 0    Objective: BP (!) 183/99   Pulse 82   Temp 98.5 F (36.9 C) (Oral)   Resp 14   Wt 94.8 kg   SpO2 98%   BMI 30.86 kg/m  Exam: Physical Exam Vitals and nursing note reviewed.  Constitutional:      Appearance: He is not diaphoretic.     Comments: Disheveled, malodorous  HENT:     Head: Normocephalic and atraumatic.     Nose: No congestion or rhinorrhea.     Mouth/Throat:     Mouth: Mucous membranes are dry.     Pharynx: Oropharynx is clear. No oropharyngeal exudate or posterior oropharyngeal erythema.  Cardiovascular:     Rate and Rhythm: Normal rate.     Comments: Was not able to appreciate b/l lower extremity pulses due to edema Pulmonary:     Effort: Pulmonary effort is normal.     Breath sounds: Normal breath sounds. No wheezing or rales.  Chest:     Chest wall: No tenderness.  Abdominal:     General: There is distension.     Tenderness: There is abdominal tenderness. There is no guarding or rebound.     Comments: Tenderness to suprapubic area without rebound or guarding  Genitourinary:    Comments: Malodorous. Foley catheter in place. Ulcers to penile shaft and upper left thigh, non-draining. Slightly erythematous skin in inguinal folds. Scrotal skin is slightly thickened, feels indurated.  Musculoskeletal:        General: Swelling and tenderness present.     Right lower leg: Edema present.     Left lower leg: Edema present.      Comments: 3+ pitting edema bilaterally up to the knees. Tenderness to palpation of lower extremity edema.   Skin:    General: Skin is warm and dry.     Comments: Feces under toe nails. Very dry and flaky skin. Toe nails are thickened.   Neurological:     Mental Status: He is alert and  oriented to person, place, and time. Mental status is at baseline.     Comments: Speech is slightly difficult to understand due to poor enunciation.                Labs and Imaging: CBC BMET  Recent Labs  Lab 11/30/20 1340  WBC 8.8  HGB 13.9  HCT 43.2  PLT 219   Recent Labs  Lab 11/30/20 1340  NA 143  K 3.6  CL 107  CO2 27  BUN 11  CREATININE 0.91  GLUCOSE 113*  CALCIUM 9.0     CT ABDOMEN PELVIS W CONTRAST  Result Date: 11/30/2020 CLINICAL DATA:  Scrotal and pelvic cellulitis. Clinical suspicion for Fournier's gangrene. EXAM: CT ABDOMEN AND PELVIS WITH CONTRAST TECHNIQUE: Multidetector CT imaging of the abdomen and pelvis was performed using the standard protocol following bolus administration of intravenous contrast. CONTRAST:  OMNIPAQUE IOHEXOL 300 MG/ML  SOLN COMPARISON:  07/31/2019 FINDINGS: Lower Chest: No acute findings. Hepatobiliary: No hepatic masses identified. Tiny sub-cm cyst is again seen in the anterior right hepatic lobe. A few tiny less than 1 cm gallstones are again noted, however there is no evidence of cholecystitis or biliary ductal dilatation. Pancreas:  No mass or inflammatory changes. Spleen: Within normal limits in size and appearance. Adrenals/Urinary Tract: No masses identified. No evidence of ureteral calculi or hydronephrosis. Diffuse bladder wall thickening is again seen, which may be due to chronic cystitis or chronic bladder outlet obstruction. Small amount high attenuation substance in the bladder may represent layering debris or blood. The tip of the Foley catheter is seen within the prostate gland rather the urinary bladder, however the bladder is  nondilated. Stomach/Bowel: Small hiatal hernia again noted. No evidence of obstruction, inflammatory process or abnormal fluid collections. Normal appendix visualized. Vascular/Lymphatic: No pathologically enlarged lymph nodes. No acute vascular findings. Aortic atherosclerotic calcification noted. Reproductive: Moderately enlarged prostate gland remains stable. No soft tissue gas or abnormal fluid collections are seen within the body wall soft tissues. Other:  None. Musculoskeletal:  No suspicious bone lesions identified. IMPRESSION: No radiographic signs of Fournier's gangrene, soft tissue abscess, or other acute findings. The tip of the Foley catheter is seen within the prostate gland rather the urinary bladder, however the bladder remains nondilated. Stable diffuse bladder wall thickening, which may be due to chronic cystitis or chronic bladder outlet obstruction. Small amount of high attenuation substance in bladder may represent debris or blood. Stable moderately enlarged prostate gland. Cholelithiasis. No radiographic evidence of cholecystitis. Stable small hiatal hernia. Aortic Atherosclerosis (ICD10-I70.0). Electronically Signed   By: Danae Orleans M.D.   On: 11/30/2020 19:29   DG Chest Portable 1 View  Result Date: 11/30/2020 CLINICAL DATA:  Indwelling catheter is malfunctioning lower leg swelling EXAM: PORTABLE CHEST 1 VIEW COMPARISON:  None. FINDINGS: The heart size and mediastinal contours are within normal limits. Both lungs are clear. The visualized skeletal structures are unremarkable. IMPRESSION: No active disease. Electronically Signed   By: Jasmine Pang M.D.   On: 11/30/2020 16:13     EKG: NSR, however multiple artifacts.  We will repeat EKG  Princess Bruins, DO 11/30/2020, 11:46 PM PGY-1, [facility] FPTS Intern pager: [phone], text pages welcome

## 2020-11-30 NOTE — ED Notes (Signed)
Pt resting on stretcher, family has gone home at this time. No acute changes noted. Pt denies any complaints. Will continue to monitor.
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
## 2020-11-30 NOTE — ED Provider Notes (Signed)
Emergency Medicine Provider Triage Evaluation Note  Edgar Mckay , a 85 y.o. male  was evaluated in triage.  Pt complains of penis pain, clogged catheter.  Patient had a catheter placed 10/17/2020 in the ED.  Also had a paraphimosis at that time that was reduced.  He has not seen urologist or any outpatient medicine since that visit in October.  Reports he has had malfunctioning catheter off and on for almost 8 weeks.  The pain the penis started within the last few days per the patient..  Review of Systems  Positive: Catheter issues, penis pain Negative: Testicular pain, testicular swelling  Physical Exam  BP (!) 146/74 (BP Location: Left Arm)   Pulse 90   Temp 98.4 F (36.9 C)   Resp 20   SpO2 100%  Gen:   Awake, no distress   Resp:  Normal effort  MSK:   Moves extremities without difficulty  Other:    Medical Decision Making  Medically screening exam initiated at 1:40 PM.  Appropriate orders placed.  Edgar Mckay was informed that the remainder of the evaluation will be completed by another provider, this initial triage assessment does not replace that evaluation, and the importance of remaining in the ED until their evaluation is complete.    Theron Arista, PA-C 11/30/20 1341    Mancel Bale, MD 11/30/20 [phone]

## 2020-11-30 NOTE — ED Provider Notes (Addendum)
[facility] EMERGENCY DEPARTMENT Provider Note   CSN: [id number] Arrival date & time: 11/30/20  1245     History Chief Complaint  Patient presents with   catheter issure    Edgar Mckay is a 85 y.o. male with history of urinary retention.  Patient denies any other medical history however states that he does not have a primary care doctor.  Presents to the emergency department with a chief complaint of leaking urinary catheter and swelling to bilateral lower extremities.  Patient reports that his catheter has been leaking from his urethral meatus over the last 8 weeks.  He reports that he has noticed some hematuria in his urine.  Patient complains of pain to urethral meatus as well as groin and scrotum.  Patient rates his pain 8/10 on the pain scale.  Pain is worse with touch and urination.  Patient has not tried any modalities to alleviate his symptoms.  Patient reports that he has had swelling to bilateral lower extremities since October 2022.  Patient denies any pain or discomfort to bilateral lower extremities.  Patient states that he does not take any diuretics.  Patient denies any aggravating or alleviating factors.  Patient denies any fever, chills, nausea, vomiting, shortness of breath, chest pain, palpitations.  HPI     Past Medical History:  Diagnosis Date   Urinary retention     There are no problems to display for this patient.   No past surgical history on file.     Family History  Family history unknown: Yes    Social History   Tobacco Use   Smoking status: Every Day    Types: Cigarettes   Smokeless tobacco: Never  Substance Use Topics   Alcohol use: Yes   Drug use: No    Home Medications Prior to Admission medications   Medication Sig Start Date End Date Taking? Authorizing Provider  bacitracin ointment Apply 1 application topically 2 (two) times daily. Places on your right leg wound for the next 7 to 10 days 10/16/20   Lennice Sites, DO  HYDROcodone-acetaminophen [facility]) 5-325 MG tablet Take 1/2 tablets by mouth every 4 hours as needed for severe pain (May cause constipation.). 10/07/20   Molpus, John, MD  tamsulosin (FLOMAX) 0.4 MG CAPS capsule Take 1 capsule by mouth daily. 10/07/20   Molpus, Jenny Reichmann, MD    Allergies    Patient has no known allergies.  Review of Systems   Review of Systems  Constitutional:  Negative for chills and fever.  Eyes:  Negative for visual disturbance.  Respiratory:  Negative for shortness of breath.   Cardiovascular:  Positive for leg swelling. Negative for chest pain and palpitations.  Gastrointestinal:  Negative for abdominal pain, nausea and vomiting.  Genitourinary:  Positive for hematuria, penile pain and testicular pain. Negative for dysuria, penile discharge, penile swelling and scrotal swelling.  Musculoskeletal:  Negative for back pain and neck pain.  Skin:  Negative for color change and rash.  Neurological:  Negative for dizziness, syncope, light-headedness and headaches.  Psychiatric/Behavioral:  Negative for confusion.    Physical Exam Updated Vital Signs BP (!) 146/74 (BP Location: Left Arm)   Pulse 90   Temp 98.4 F (36.9 C)   Resp 20   SpO2 100%   Physical Exam Vitals and nursing note reviewed. Exam conducted with a chaperone present (Male RN present as chaperone).  Constitutional:      General: He is not in acute distress.    Appearance: He  is not ill-appearing, toxic-appearing or diaphoretic.     Comments: Patient is disheveled and not well groomed  HENT:     Head: Normocephalic.  Eyes:     General: No scleral icterus.       Right eye: No discharge.        Left eye: No discharge.  Cardiovascular:     Rate and Rhythm: Normal rate.     Pulses:          Dorsalis pedis pulses are 2+ on the right side and 2+ on the left side.     Heart sounds: Normal heart sounds, S1 normal and S2 normal.  Pulmonary:     Effort: Pulmonary effort is normal. No tachypnea,  bradypnea or respiratory distress.     Breath sounds: Normal breath sounds.  Abdominal:     General: Abdomen is protuberant. There is no distension. There are no signs of injury.     Palpations: Abdomen is soft. There is no mass or pulsatile mass.     Tenderness: There is abdominal tenderness in the suprapubic area. There is no guarding or rebound.     Comments: Mild tenderness to suprapubic area  Genitourinary:    Pubic Area: Rash present.     Penis: Uncircumcised. Tenderness present. No discharge, swelling or lesions.      Testes:        Right: Tenderness present. Mass, swelling, testicular hydrocele or varicocele not present. Right testis is descended. Cremasteric reflex is present.         Left: Tenderness present. Mass, swelling, testicular hydrocele or varicocele not present. Left testis is descended. Cremasteric reflex is present.      Epididymis:     Right: Normal.     Left: Normal.     Tanner stage (genital): 5.     Comments: Patient has diffuse tenderness to scrotum.  Skin of scrotum is extremely macerated and erythematous.  Erythema is noted throughout groin.  Patient noted to have purulent material to right groin without any obvious abscess.  Tenderness to head of penis.  No injury to urethral meatus. Musculoskeletal:     Right lower leg: 4+ Edema present.     Left lower leg: 4+ Edema present.     Comments: +4 edema to bilateral lower extremities and feet.  No erythema, warmth, or tenderness to bilateral lower extremities, ankle, or feet.  Feet:     Right foot:     Toenail Condition: Right toenails are abnormally thick.     Left foot:     Toenail Condition: Left toenails are abnormally thick.     Comments: Macerated skin between all digits on bilateral feet.  No erythema or purulent discharge. Skin:    General: Skin is warm and dry.  Neurological:     General: No focal deficit present.     Mental Status: He is alert.     GCS: GCS eye subscore is 4. GCS verbal subscore is  5. GCS motor subscore is 6.  Psychiatric:        Behavior: Behavior is cooperative.    ED Results / Procedures / Treatments   Labs (all labs ordered are listed, but only abnormal results are displayed) Labs Reviewed  BASIC METABOLIC PANEL - Abnormal; Notable for the following components:      Result Value   Glucose, Bld 113 (*)    All other components within normal limits  CBC WITH DIFFERENTIAL/PLATELET - Abnormal; Notable for the following components:   RDW 15.7 (*)  All other components within normal limits  RESP PANEL BY RT-PCR (FLU A&B, COVID) ARPGX2  URINE CULTURE  CULTURE, BLOOD (SINGLE)  BRAIN NATRIURETIC PEPTIDE  LACTIC ACID, PLASMA  URINALYSIS, ROUTINE W REFLEX MICROSCOPIC  LACTIC ACID, PLASMA    EKG None  Radiology CT ABDOMEN PELVIS W CONTRAST  Result Date: 11/30/2020 CLINICAL DATA:  Scrotal and pelvic cellulitis. Clinical suspicion for Fournier's gangrene. EXAM: CT ABDOMEN AND PELVIS WITH CONTRAST TECHNIQUE: Multidetector CT imaging of the abdomen and pelvis was performed using the standard protocol following bolus administration of intravenous contrast. CONTRAST:  145mL OMNIPAQUE IOHEXOL 300 MG/ML  SOLN COMPARISON:  07/31/2019 FINDINGS: Lower Chest: No acute findings. Hepatobiliary: No hepatic masses identified. Tiny sub-cm cyst is again seen in the anterior right hepatic lobe. A few tiny less than 1 cm gallstones are again noted, however there is no evidence of cholecystitis or biliary ductal dilatation. Pancreas:  No mass or inflammatory changes. Spleen: Within normal limits in size and appearance. Adrenals/Urinary Tract: No masses identified. No evidence of ureteral calculi or hydronephrosis. Diffuse bladder wall thickening is again seen, which may be due to chronic cystitis or chronic bladder outlet obstruction. Small amount high attenuation substance in the bladder may represent layering debris or blood. The tip of the Foley catheter is seen within the prostate  gland rather the urinary bladder, however the bladder is nondilated. Stomach/Bowel: Small hiatal hernia again noted. No evidence of obstruction, inflammatory process or abnormal fluid collections. Normal appendix visualized. Vascular/Lymphatic: No pathologically enlarged lymph nodes. No acute vascular findings. Aortic atherosclerotic calcification noted. Reproductive: Moderately enlarged prostate gland remains stable. No soft tissue gas or abnormal fluid collections are seen within the body wall soft tissues. Other:  None. Musculoskeletal:  No suspicious bone lesions identified. IMPRESSION: No radiographic signs of Fournier's gangrene, soft tissue abscess, or other acute findings. The tip of the Foley catheter is seen within the prostate gland rather the urinary bladder, however the bladder remains nondilated. Stable diffuse bladder wall thickening, which may be due to chronic cystitis or chronic bladder outlet obstruction. Small amount of high attenuation substance in bladder may represent debris or blood. Stable moderately enlarged prostate gland. Cholelithiasis. No radiographic evidence of cholecystitis. Stable small hiatal hernia. Aortic Atherosclerosis (ICD10-I70.0). Electronically Signed   By: Marlaine Hind M.D.   On: 11/30/2020 19:29   DG Chest Portable 1 View  Result Date: 11/30/2020 CLINICAL DATA:  Indwelling catheter is malfunctioning lower leg swelling EXAM: PORTABLE CHEST 1 VIEW COMPARISON:  None. FINDINGS: The heart size and mediastinal contours are within normal limits. Both lungs are clear. The visualized skeletal structures are unremarkable. IMPRESSION: No active disease. Electronically Signed   By: Donavan Foil M.D.   On: 11/30/2020 16:13    Procedures .Critical Care Performed by: Loni Beckwith, PA-C Authorized by: Loni Beckwith, PA-C   Critical care provider statement:    Critical care time (minutes):  30   Critical care was time spent personally by me on the following  activities:  Development of treatment plan with patient or surrogate, discussions with consultants, evaluation of patient's response to treatment, examination of patient, ordering and review of laboratory studies, ordering and review of radiographic studies, ordering and performing treatments and interventions, pulse oximetry, re-evaluation of patient's condition and review of old charts Comments:     Concern for Fournier's gangrene; cellulitis infection requiring broad-spectrum antibiotics.   Medications Ordered in ED Medications  morphine 4 MG/ML injection 4 mg (4 mg Intravenous Given 11/30/20 1633)  piperacillin-tazobactam (  ZOSYN) IVPB 3.375 g (0 g Intravenous Stopped 11/30/20 1700)  vancomycin (VANCOREADY) IVPB 2000 mg/400 mL (0 mg Intravenous Stopped 11/30/20 1938)  iohexol (OMNIPAQUE) 300 MG/ML solution 100 mL (100 mLs Intravenous Contrast Given 11/30/20 1855)  morphine 4 MG/ML injection 4 mg (4 mg Intravenous Given 11/30/20 1918)    ED Course  I have reviewed the triage vital signs and the nursing notes.  Pertinent labs & imaging results that were available during my care of the patient were reviewed by me and considered in my medical decision making (see chart for details).  Clinical Course as of 11/30/20 2151  Mon Nov 30, 2020  2005 Spoke to Dr. Cyndie Chime with family medicine residency who will see the patient for admission. [PB]    Clinical Course User Index [PB] Berneice Heinrich   MDM Rules/Calculators/A&P                           Alert 85 year old male no acute stress, nontoxic-appearing.  Presents to ED with chief complaint of issues with indwelling Foley catheter and swelling to bilateral lower extremities.  On physical exam patient has diffuse tenderness to scrotum.  Significant wound is extremely macerated and erythematous.  Erythema noted throughout groins.  Concern for possible cellulitis versus Fournier's gangrene.  We will start patient on vancomycin and  Zosyn.  Will obtain blood culture and lactic acid as well as CT abdomen and pelvis.  Due to patient's lower leg swelling concern for possible CHF exacerbation.  Will obtain BNP, chest x-ray, and EKG. Chest x-ray shows no active cardiopulmonary disease.  BNP within normal limits.  Low suspicion for acute CHF at this time.  Will hold any Lasix medication at this time due to indwelling catheter complication at this time.  Catheter was attempted to be removed multiple times.  RN states that only 8 mm of fluid are being removed from balloon.  States that fluid is yellow and concerning for urine.  Will stop any further attempts to removing catheter at this time until imaging from CT abdomen pelvis is obtained.  Lactic acid 1.9  CT imaging shows no radiographic signs of Fournier's gangrene, soft tissue abscesses, or other acute findings.  Tip of Foley catheter seen within the prostate gland rather than urinary bladder however bladder remains nondilated.  Stable diffuse bladder wall thickening which may be due to chronic cystitis or chronic bladder outlet obstruction.  With imaging showing catheter balloon to be deflated we will reattempt to remove catheter after patient receives additional pain medication.  Catheter was able to be successfully removed.  Catheter tip was found to be coated with sediment.  Will reinsert catheter due to history of urinary retention.  Will consult hospitalist team for admission due to cellulitis patient groin as well as concern for possible urinary tract infection.  Urinalysis and urine culture pending at this time.  Patient was discussed with and evaluated by Dr. Rush Landmark.   Final Clinical Impression(s) / ED Diagnoses Final diagnoses:  Cellulitis of other specified site    Rx / DC Orders ED Discharge Orders     None        Haskel Schroeder, PA-C 11/30/20 2155    Haskel Schroeder, PA-C 11/30/20 2250    Tegeler, Canary Brim, MD 11/30/20 2317

## 2020-11-30 NOTE — ED Notes (Signed)
Helped provider remove pt pants so provider could examine pt groin and catheter. Upon removal of pt's pants, pt's son at bedside told this RN "you can just throw them away, and the shoes too". Clarified that the son wanted the pt's pants and shoes thrown away. Pt's son said yes. This RN placed pants and shoes in trash. Pants and shoes were heavily soiled.

## 2020-11-30 NOTE — ED Notes (Signed)
Unable to remove more than 8cc from existing indwelling catheter balloon and unable to remove urethral catheter. Dimple Casey PA-C notified.

## 2020-11-30 NOTE — ED Notes (Signed)
ED Provider at bedside. 

## 2020-11-30 NOTE — ED Notes (Signed)
Patient transported to CT 

## 2020-12-01 ENCOUNTER — Inpatient Hospital Stay (HOSPITAL_COMMUNITY): Payer: Medicare Other

## 2020-12-01 DIAGNOSIS — Z659 Problem related to unspecified psychosocial circumstances: Secondary | ICD-10-CM

## 2020-12-01 DIAGNOSIS — R0609 Other forms of dyspnea: Secondary | ICD-10-CM

## 2020-12-01 DIAGNOSIS — T83091A Other mechanical complication of indwelling urethral catheter, initial encounter: Principal | ICD-10-CM

## 2020-12-01 DIAGNOSIS — N3001 Acute cystitis with hematuria: Secondary | ICD-10-CM

## 2020-12-01 DIAGNOSIS — R609 Edema, unspecified: Secondary | ICD-10-CM

## 2020-12-01 LAB — ECHOCARDIOGRAM COMPLETE
AR max vel: 2.56 cm2
AV Peak grad: 10.5 mmHg
Ao pk vel: 1.62 m/s
Area-P 1/2: 3.45 cm2
Calc EF: 61.2 %
S' Lateral: 3.3 cm
Single Plane A2C EF: 63.6 %
Single Plane A4C EF: 58.6 %
Weight: 3343.94 oz

## 2020-12-01 LAB — FOLATE: Folate: 8.2 ng/mL (ref >5.9)

## 2020-12-01 LAB — HIV ANTIBODY (ROUTINE TESTING W REFLEX): HIV Screen 4th Generation wRfx: NONREACTIVE

## 2020-12-01 LAB — URINE CULTURE

## 2020-12-01 LAB — COMPREHENSIVE METABOLIC PANEL
ALT: 12 U/L (ref 0–44)
AST: 18 U/L (ref 15–41)
Albumin: 3 g/dL — ABNORMAL LOW (ref 3.5–5.0)
Alkaline Phosphatase: 58 U/L (ref 38–126)
Anion gap: 9 (ref 5–15)
BUN: 11 mg/dL (ref 8–23)
CO2: 24 mmol/L (ref 22–32)
Calcium: 9.1 mg/dL (ref 8.9–10.3)
Chloride: 108 mmol/L (ref 98–111)
Creatinine, Ser: 0.76 mg/dL (ref 0.61–1.24)
GFR, Estimated: >60 mL/min (ref >60)
Glucose, Bld: 97 mg/dL (ref 70–99)
Potassium: 3.5 mmol/L (ref 3.5–5.1)
Sodium: 141 mmol/L (ref 135–145)
Total Bilirubin: 0.8 mg/dL (ref 0.3–1.2)
Total Protein: 5.8 g/dL — ABNORMAL LOW (ref 6.5–8.1)

## 2020-12-01 LAB — LIPID PANEL
Cholesterol: 155 mg/dL (ref 0–200)
HDL: 40 mg/dL — ABNORMAL LOW (ref >40)
LDL Cholesterol: 88 mg/dL (ref 0–99)
Total CHOL/HDL Ratio: 3.9 RATIO
Triglycerides: 134 mg/dL (ref <150)
VLDL: 27 mg/dL (ref 0–40)

## 2020-12-01 LAB — VITAMIN B12: Vitamin B-12: 104 pg/mL — ABNORMAL LOW (ref 180–914)

## 2020-12-01 LAB — RPR
RPR Ser Ql: REACTIVE — AB
RPR Titer: 1:1 {titer}

## 2020-12-01 LAB — HEMOGLOBIN A1C
Hgb A1c MFr Bld: 5.6 % (ref 4.8–5.6)
Mean Plasma Glucose: 114.02 mg/dL

## 2020-12-01 LAB — CBC
HCT: 40.6 % (ref 39.0–52.0)
Hemoglobin: 13.1 g/dL (ref 13.0–17.0)
MCH: 28.7 pg (ref 26.0–34.0)
MCHC: 32.3 g/dL (ref 30.0–36.0)
MCV: 88.8 fL (ref 80.0–100.0)
Platelets: 184 10*3/uL (ref 150–400)
RBC: 4.57 MIL/uL (ref 4.22–5.81)
RDW: 15.7 % — ABNORMAL HIGH (ref 11.5–15.5)
WBC: 6.9 10*3/uL (ref 4.0–10.5)
nRBC: 0 % (ref 0.0–0.2)

## 2020-12-01 LAB — T4, FREE: Free T4: 0.81 ng/dL (ref 0.61–1.12)

## 2020-12-01 LAB — TSH: TSH: 3.043 u[IU]/mL (ref 0.350–4.500)

## 2020-12-01 MED ORDER — FINASTERIDE 5 MG PO TABS
5.0000 mg | ORAL_TABLET | Freq: Every day | ORAL | Status: DC
  Administered 2020-12-01 – 2020-12-08 (×7): 5 mg via ORAL
  Filled 2020-12-01 (×8): qty 1

## 2020-12-01 MED ORDER — BARRIER CREAM NON-SPECIFIED
1.0000 "application " | TOPICAL_CREAM | Freq: Two times a day (BID) | TOPICAL | Status: DC
  Administered 2020-12-01 – 2020-12-08 (×15): 1 via TOPICAL
  Filled 2020-12-01: qty 1

## 2020-12-01 MED ORDER — SODIUM CHLORIDE 0.9 % IV SOLN
1.0000 g | Freq: Every day | INTRAVENOUS | Status: DC
  Administered 2020-12-01 (×2): 1 g via INTRAVENOUS
  Filled 2020-12-01 (×2): qty 10

## 2020-12-01 MED ORDER — PENICILLIN G BENZATHINE 1200000 UNIT/2ML IM SUSY
2.4000 10*6.[IU] | PREFILLED_SYRINGE | Freq: Once | INTRAMUSCULAR | Status: AC
  Administered 2020-12-01: 2.4 10*6.[IU] via INTRAMUSCULAR
  Filled 2020-12-01: qty 4

## 2020-12-01 MED ORDER — TAMSULOSIN HCL 0.4 MG PO CAPS
0.4000 mg | ORAL_CAPSULE | Freq: Every day | ORAL | Status: DC
  Administered 2020-12-01 – 2020-12-08 (×7): 0.4 mg via ORAL
  Filled 2020-12-01 (×8): qty 1

## 2020-12-01 MED ORDER — VITAMIN B-12 1000 MCG PO TABS
1000.0000 ug | ORAL_TABLET | Freq: Every day | ORAL | Status: DC
  Administered 2020-12-01 – 2020-12-08 (×7): 1000 ug via ORAL
  Filled 2020-12-01 (×8): qty 1

## 2020-12-01 NOTE — ED Notes (Signed)
Pt transported to CT ?

## 2020-12-01 NOTE — Progress Notes (Signed)
Pt arrived to [address] room 32 alert and oriented x4. Call light in reach bed in lowest position.

## 2020-12-01 NOTE — ED Notes (Signed)
Pt given warm blanket per request. Lights off, side rails up x2, call bell within reach. No acute changes noted. Will continue to monitor.

## 2020-12-01 NOTE — Consult Note (Signed)
Reason for Consult: Urinary Retention / Very Large Prostate, Gross Hematuria / Foley Malposition  Referring Physician: Madison Hickman MD  Edgar Mckay is an 85 y.o. male.   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 Was briefly tried on alpha blockers but did not keep appointments. Prostate 155gm by CT ellipsoid calculation 2022. He is NOT AN OPERATIVE CANDIDATE. It appears that he had catheter in place for months and months without exchange due to family neglect.   2 - Gross Hematuria / Foley Malposition - catheter blown up in prostate on ER CT with resultant hematura as expected with this trauma.  PMH sig for Severe LE vensous stasis, Failure to thrive / neglect / DNR. Lives at home with son who check on him periodially.  Today " Edgar Mckay" is sene in consultation for above. He saw Dr. Karsten Ro in 2021 but failed to follow up due to transportation and social situaton.    Past Medical History:  Diagnosis Date   Urinary retention     No past surgical history on file.  Family History  Family history unknown: Yes    Social History:  reports that he has been smoking cigarettes. He has never used smokeless tobacco. He reports current alcohol use. He reports that he does not use drugs.  Allergies: No Known Allergies  Medications: I have reviewed the patient's current medications.  Results for orders placed or performed during the hospital encounter of 11/30/20 (from the past 48 hour(s))  Basic metabolic panel     Status: Abnormal   Collection Time: 11/30/20  1:40 PM  Result Value Ref Range   Sodium 143 135 - 145 mmol/L   Potassium 3.6 3.5 - 5.1 mmol/L   Chloride 107 98 - 111 mmol/L   CO2 27 22 - 32 mmol/L   Glucose, Bld 113 (H) 70 - 99 mg/dL    Comment: Glucose reference range applies only to samples taken after fasting for at least 8 hours.   BUN 11 8 - 23 mg/dL   Creatinine, Ser 0.91 0.61 - 1.24 mg/dL   Calcium 9.0 8.9 - 10.3 mg/dL   GFR,  Estimated >60 >60 mL/min    Comment: (NOTE) Calculated using the CKD-EPI Creatinine Equation (2021)    Anion gap 9 5 - 15    Comment: Performed at [facility] [address] [place], [place] [address]  CBC with Differential     Status: Abnormal   Collection Time: 11/30/20  1:40 PM  Result Value Ref Range   WBC 8.8 4.0 - 10.5 K/uL   RBC 4.91 4.22 - 5.81 MIL/uL   Hemoglobin 13.9 13.0 - 17.0 g/dL   HCT 43.2 39.0 - 52.0 %   MCV 88.0 80.0 - 100.0 fL   MCH 28.3 26.0 - 34.0 pg   MCHC 32.2 30.0 - 36.0 g/dL   RDW 15.7 (H) 11.5 - 15.5 %   Platelets 219 150 - 400 K/uL   nRBC 0.0 0.0 - 0.2 %   Neutrophils Relative % 61 %   Neutro Abs 5.5 1.7 - 7.7 K/uL   Lymphocytes Relative 25 %   Lymphs Abs 2.2 0.7 - 4.0 K/uL   Monocytes Relative 10 %   Monocytes Absolute 0.8 0.1 - 1.0 K/uL   Eosinophils Relative 2 %   Eosinophils Absolute 0.1 0.0 - 0.5 K/uL   Basophils Relative 1 %   Basophils Absolute 0.1 0.0 - 0.1 K/uL   Immature Granulocytes  1 %   Abs Immature Granulocytes 0.04 0.00 - 0.07 K/uL    Comment: Performed at [facility], [place] [address] [place], [place] [address]  Brain natriuretic peptide     Status: None   Collection Time: 11/30/20  3:44 PM  Result Value Ref Range   B Natriuretic Peptide 5.6 0.0 - 100.0 pg/mL    Comment: Performed at [facility] [address], [place], [place] [address]  Lactic acid, plasma     Status: None   Collection Time: 11/30/20  4:20 PM  Result Value Ref Range   Lactic Acid, Venous 1.9 0.5 - 1.9 mmol/L    Comment: Performed at [facility] [address] [place], [place] [address]  Culture, blood (single)     Status: None (Preliminary result)   Collection Time: 11/30/20  4:29 PM   Specimen: Site Not Specified; Blood  Result Value Ref Range   Specimen Description SITE NOT SPECIFIED    Special Requests      BOTTLES DRAWN AEROBIC AND ANAEROBIC Blood Culture adequate volume   Culture      NO GROWTH < 24 HOURS Performed at  [facility] [address] [place], [place] [address]    Report Status PENDING   Resp Panel by RT-PCR (Flu A&B, Covid) Nasopharyngeal Swab     Status: None   Collection Time: 11/30/20  4:50 PM   Specimen: Nasopharyngeal Swab; Nasopharyngeal(NP) swabs in vial transport medium  Result Value Ref Range   SARS Coronavirus 2 by RT PCR NEGATIVE NEGATIVE    Comment: (NOTE) SARS-CoV-2 target nucleic acids are NOT DETECTED.  The SARS-CoV-2 RNA is generally detectable in upper respiratory specimens during the acute phase of infection. The lowest concentration of SARS-CoV-2 viral copies this assay can detect is 138 copies/mL. A negative result does not preclude SARS-Cov-2 infection and should not be used as the sole basis for treatment or other patient management decisions. A negative result may occur with  improper specimen collection/handling, submission of specimen other than nasopharyngeal swab, presence of viral mutation(s) within the areas targeted by this assay, and inadequate number of viral copies(<138 copies/mL). A negative result must be combined with clinical observations, patient history, and epidemiological information. The expected result is Negative.  Fact Sheet for Patients:  EntrepreneurPulse.com.au  Fact Sheet for Healthcare Providers:  IncredibleEmployment.be  This test is no t yet approved or cleared by the [place] FDA and  has been authorized for detection and/or diagnosis of SARS-CoV-2 by FDA under an Emergency Use Authorization (EUA). This EUA will remain  in effect (meaning this test can be used) for the duration of the COVID-19 declaration under Section 564(b)(1) of the Act, 21 U.S.C.section 360bbb-3(b)(1), unless the authorization is terminated  or revoked sooner.       Influenza A by PCR NEGATIVE NEGATIVE   Influenza B by PCR NEGATIVE NEGATIVE    Comment: (NOTE) The Xpert Xpress SARS-CoV-2/FLU/RSV plus assay  is intended as an aid in the diagnosis of influenza from Nasopharyngeal swab specimens and should not be used as a sole basis for treatment. Nasal washings and aspirates are unacceptable for Xpert Xpress SARS-CoV-2/FLU/RSV testing.  Fact Sheet for Patients: EntrepreneurPulse.com.au  Fact Sheet for Healthcare Providers: IncredibleEmployment.be  This test is not yet approved or cleared by the [place] FDA and has been authorized for detection and/or diagnosis of SARS-CoV-2 by FDA under an Emergency Use Authorization (EUA). This EUA will remain in effect (meaning this  test can be used) for the duration of the COVID-19 declaration under Section 564(b)(1) of the Act, 21 U.S.C. section 360bbb-3(b)(1), unless the authorization is terminated or revoked.  Performed at [facility], [address] [place], [place] [address]   Urinalysis, Routine w reflex microscopic Urine, Catheterized     Status: Abnormal   Collection Time: 11/30/20  7:34 PM  Result Value Ref Range   Color, Urine RED (A) YELLOW   APPearance TURBID (A) CLEAR   Specific Gravity, Urine  1.005 - 1.030    TEST NOT REPORTED DUE TO COLOR INTERFERENCE OF URINE PIGMENT   pH  5.0 - 8.0    TEST NOT REPORTED DUE TO COLOR INTERFERENCE OF URINE PIGMENT   Glucose, UA (A) NEGATIVE mg/dL    TEST NOT REPORTED DUE TO COLOR INTERFERENCE OF URINE PIGMENT   Hgb urine dipstick (A) NEGATIVE    TEST NOT REPORTED DUE TO COLOR INTERFERENCE OF URINE PIGMENT   Bilirubin Urine (A) NEGATIVE    TEST NOT REPORTED DUE TO COLOR INTERFERENCE OF URINE PIGMENT   Ketones, ur (A) NEGATIVE mg/dL    TEST NOT REPORTED DUE TO COLOR INTERFERENCE OF URINE PIGMENT   Protein, ur (A) NEGATIVE mg/dL    TEST NOT REPORTED DUE TO COLOR INTERFERENCE OF URINE PIGMENT   Nitrite (A) NEGATIVE    TEST NOT REPORTED DUE TO COLOR INTERFERENCE OF URINE PIGMENT   Leukocytes,Ua (A) NEGATIVE    TEST NOT REPORTED DUE TO COLOR  INTERFERENCE OF URINE PIGMENT   RBC / HPF >50 (H) 0 - 5 RBC/hpf   WBC, UA 11-20 0 - 5 WBC/hpf   Bacteria, UA FEW (A) NONE SEEN    Comment: Performed at [facility], [address] [place], [place] [address]  Urinalysis, Routine w reflex microscopic Urine, Catheterized     Status: Abnormal   Collection Time: 11/30/20 10:05 PM  Result Value Ref Range   Color, Urine YELLOW YELLOW   APPearance CLOUDY (A) CLEAR   Specific Gravity, Urine 1.045 (H) 1.005 - 1.030   pH 5.0 5.0 - 8.0   Glucose, UA NEGATIVE NEGATIVE mg/dL   Hgb urine dipstick LARGE (A) NEGATIVE   Bilirubin Urine NEGATIVE NEGATIVE   Ketones, ur NEGATIVE NEGATIVE mg/dL   Protein, ur 191100 (A) NEGATIVE mg/dL   Nitrite NEGATIVE NEGATIVE   Leukocytes,Ua LARGE (A) NEGATIVE   RBC / HPF >50 (H) 0 - 5 RBC/hpf   WBC, UA >50 (H) 0 - 5 WBC/hpf   Bacteria, UA FEW (A) NONE SEEN    Comment: Performed at [facility], [address] [place], [place] [address]  Hemoglobin A1c     Status: None   Collection Time: 12/01/20  8:00 AM  Result Value Ref Range   Hgb A1c MFr Bld 5.6 4.8 - 5.6 %    Comment: (NOTE) Pre diabetes:          5.7%-6.4%  Diabetes:              >6.4%  Glycemic control for   <7.0% adults with diabetes    Mean Plasma Glucose 114.02 mg/dL    Comment: Performed at [facility], [address] [place], [place] [address]  Lipid panel     Status: Abnormal   Collection Time: 12/01/20  8:00 AM  Result Value Ref Range   Cholesterol 155 0 - 200 mg/dL   Triglycerides 086134 <578<150 mg/dL   HDL 40 (L) >46>40 mg/dL  Total CHOL/HDL Ratio 3.9 RATIO   VLDL 27 0 - 40 mg/dL   LDL Cholesterol 88 0 - 99 mg/dL    Comment:        Total Cholesterol/HDL:CHD Risk Coronary Heart Disease Risk Table                     Men   Women  1/2 Average Risk   3.4   3.3  Average Risk       5.0   4.4  2 X Average Risk   9.6   7.1  3 X Average Risk  23.4   11.0        Use the calculated Patient Ratio above and the CHD Risk Table to  determine the patient's CHD Risk.        ATP III CLASSIFICATION (LDL):  <100     mg/dL   Optimal  100-129  mg/dL   Near or Above                    Optimal  130-159  mg/dL   Borderline  160-189  mg/dL   High  >190     mg/dL   Very High Performed at [facility] [address], [place], [place] [address]   TSH     Status: None   Collection Time: 12/01/20  8:00 AM  Result Value Ref Range   TSH 3.043 0.350 - 4.500 uIU/mL    Comment: Performed by a 3rd Generation assay with a functional sensitivity of <=0.01 uIU/mL. Performed at [facility], [place] [address], [place], [place] [address]   T4, free     Status: None   Collection Time: 12/01/20  8:00 AM  Result Value Ref Range   Free T4 0.81 0.61 - 1.12 ng/dL    Comment: (NOTE) Biotin ingestion may interfere with free T4 tests. If the results are inconsistent with the TSH level, previous test results, or the clinical presentation, then consider biotin interference. If needed, order repeat testing after stopping biotin. Performed at [facility], [place] [address] [place], [place] [address]   Comprehensive metabolic panel     Status: Abnormal   Collection Time: 12/01/20  8:00 AM  Result Value Ref Range   Sodium 141 135 - 145 mmol/L   Potassium 3.5 3.5 - 5.1 mmol/L   Chloride 108 98 - 111 mmol/L   CO2 24 22 - 32 mmol/L   Glucose, Bld 97 70 - 99 mg/dL    Comment: Glucose reference range applies only to samples taken after fasting for at least 8 hours.   BUN 11 8 - 23 mg/dL   Creatinine, Ser 0.76 0.61 - 1.24 mg/dL   Calcium 9.1 8.9 - 10.3 mg/dL   Total Protein 5.8 (L) 6.5 - 8.1 g/dL   Albumin 3.0 (L) 3.5 - 5.0 g/dL   AST 18 15 - 41 U/L   ALT 12 0 - 44 U/L   Alkaline Phosphatase 58 38 - 126 U/L   Total Bilirubin 0.8 0.3 - 1.2 mg/dL   GFR, Estimated >60 >60 mL/min    Comment: (NOTE) Calculated using the CKD-EPI Creatinine Equation (2021)    Anion gap 9 5 - 15    Comment: Performed at [facility],  [place] [address] [place], [place] [address]  CBC     Status: Abnormal   Collection Time: 12/01/20  8:00 AM  Result Value Ref Range   WBC  6.9 4.0 - 10.5 K/uL   RBC 4.57 4.22 - 5.81 MIL/uL   Hemoglobin 13.1 13.0 - 17.0 g/dL   HCT 40.6 39.0 - 52.0 %   MCV 88.8 80.0 - 100.0 fL   MCH 28.7 26.0 - 34.0 pg   MCHC 32.3 30.0 - 36.0 g/dL   RDW 15.7 (H) 11.5 - 15.5 %   Platelets 184 150 - 400 K/uL   nRBC 0.0 0.0 - 0.2 %    Comment: Performed at [facility] [address], [place], [place] [address]  RPR     Status: Abnormal   Collection Time: 12/01/20  8:00 AM  Result Value Ref Range   RPR Ser Ql REACTIVE (A) NON REACTIVE    Comment: SENT FOR CONFIRMATION   RPR Titer 1:1     Comment: Performed at [facility], [place] [address], [place], [place] [address]  HIV Antibody (routine testing w rflx)     Status: None   Collection Time: 12/01/20  8:00 AM  Result Value Ref Range   HIV Screen 4th Generation wRfx Non Reactive Non Reactive    Comment: Performed at [facility], [place] [address] [place], [place] [address]  Folate, serum, performed at [facility]     Status: None   Collection Time: 12/01/20  8:00 AM  Result Value Ref Range   Folate 8.2 >5.9 ng/mL    Comment: Performed at [facility], [place] [address], [place], [place] [address]  Vitamin B12     Status: Abnormal   Collection Time: 12/01/20  8:00 AM  Result Value Ref Range   Vitamin B-12 104 (L) 180 - 914 pg/mL    Comment: (NOTE) This assay is not validated for testing neonatal or myeloproliferative syndrome specimens for Vitamin B12 levels. Performed at [facility], [place] [address] [place],  [address]     CT HEAD WO CONTRAST (5MM)  Result Date: 12/01/2020 CLINICAL DATA:  Altered mental status, dementia question Alzheimer's EXAM: CT HEAD WITHOUT CONTRAST TECHNIQUE: Contiguous axial images were obtained from the base of the skull through the vertex without intravenous contrast. COMPARISON:  None  FINDINGS: Brain: Generalized atrophy. Normal ventricular morphology. No midline shift or mass effect. 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 Probable small white matter infarct RIGHT frontal. Small vessel chronic ischemic changes of deep cerebral white matter. No intracranial hemorrhage, mass lesion, or evidence of acute infarction. No extra-axial fluid collections. Vascular: No hyperdense vessels. Mild atherosclerotic calcification of internal carotid arteries bilaterally at skull base Skull: Intact Sinuses/Orbits: Clear Other: N/A IMPRESSION: Atrophy with small vessel chronic ischemic changes of deep cerebral white matter. 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 Probable old small white matter infarct RIGHT frontal lobe. No acute intracranial abnormalities. Electronically Signed   By: Lavonia Dana M.D.   On: 12/01/2020 12:08   CT ABDOMEN PELVIS W CONTRAST  Result Date: 11/30/2020 CLINICAL DATA:  Scrotal and pelvic cellulitis. Clinical suspicion for Fournier's gangrene. EXAM: CT ABDOMEN AND PELVIS WITH CONTRAST TECHNIQUE: Multidetector CT imaging of the abdomen and pelvis was performed using the standard protocol following bolus administration of intravenous contrast. CONTRAST:  16mL OMNIPAQUE IOHEXOL 300 MG/ML  SOLN COMPARISON:  07/31/2019 FINDINGS: Lower Chest: No acute findings. Hepatobiliary: No hepatic masses identified. Tiny sub-cm cyst is again seen in the anterior right hepatic lobe. A few tiny less than 1 cm gallstones are again noted, however there is no evidence of cholecystitis or biliary ductal  dilatation. Pancreas:  No mass or inflammatory changes. Spleen: Within normal limits in size and appearance. Adrenals/Urinary Tract: No masses identified. No evidence of ureteral calculi or hydronephrosis. Diffuse bladder wall thickening is again seen, which may be due to chronic cystitis or chronic bladder outlet obstruction. Small amount high attenuation substance in the bladder may represent  layering debris or blood. The tip of the Foley catheter is seen within the prostate gland rather the urinary bladder, however the bladder is nondilated. Stomach/Bowel: Small hiatal hernia again noted. No evidence of obstruction, inflammatory process or abnormal fluid collections. Normal appendix visualized. Vascular/Lymphatic: No pathologically enlarged lymph nodes. No acute vascular findings. Aortic atherosclerotic calcification noted. Reproductive: Moderately enlarged prostate gland remains stable. No soft tissue gas or abnormal fluid collections are seen within the body wall soft tissues. Other:  None. Musculoskeletal:  No suspicious bone lesions identified. IMPRESSION: No radiographic signs of Fournier's gangrene, soft tissue abscess, or other acute findings. The tip of the Foley catheter is seen within the prostate gland rather the urinary bladder, however the bladder remains nondilated. Stable diffuse bladder wall thickening, which may be due to chronic cystitis or chronic bladder outlet obstruction. Small amount of high attenuation substance in bladder may represent debris or blood. Stable moderately enlarged prostate gland. Cholelithiasis. No radiographic evidence of cholecystitis. Stable small hiatal hernia. Aortic Atherosclerosis (ICD10-I70.0). Electronically Signed   By: Marlaine Hind M.D.   On: 11/30/2020 19:29   DG Chest Portable 1 View  Result Date: 11/30/2020 CLINICAL DATA:  Indwelling catheter is malfunctioning lower leg swelling EXAM: PORTABLE CHEST 1 VIEW COMPARISON:  None. FINDINGS: The heart size and mediastinal contours are within normal limits. Both lungs are clear. The visualized skeletal structures are unremarkable. IMPRESSION: No active disease. Electronically Signed   By: Donavan Foil M.D.   On: 11/30/2020 16:13   VAS [place] LOWER EXTREMITY VENOUS (DVT)  Result Date: 12/01/2020  Lower Venous DVT Study Patient Name:  SHIRRELL PRUETTE  Date of Exam:   12/01/2020 Medical Rec #: [id number]     Accession #:    [id number] Date of Birth: August 19, 1935    Patient Gender: M Patient Age:   32 years Exam Location:  [facility] Procedure:      VAS [place] LOWER EXTREMITY VENOUS (DVT) Referring Phys: Marda Stalker --------------------------------------------------------------------------------  Indications: Edema.  Limitations: Poor ultrasound/tissue interface. Comparison Study: No recent prior studies. Performing Technologist: Darlin Coco RDMS, RVT  Examination Guidelines: A complete evaluation includes B-mode imaging, spectral Doppler, color Doppler, and power Doppler as needed of all accessible portions of each vessel. Bilateral testing is considered an integral part of a complete examination. Limited examinations for reoccurring indications may be performed as noted. The reflux portion of the exam is performed with the patient in reverse Trendelenburg.  +---------+---------------+---------+-----------+----------+-------------------+ RIGHT    CompressibilityPhasicitySpontaneityPropertiesThrombus Aging      +---------+---------------+---------+-----------+----------+-------------------+ CFV      Full           Yes      Yes                                      +---------+---------------+---------+-----------+----------+-------------------+ SFJ      Full                                                             +---------+---------------+---------+-----------+----------+-------------------+  FV Prox  Full                                                             +---------+---------------+---------+-----------+----------+-------------------+ FV Mid   Full                                                             +---------+---------------+---------+-----------+----------+-------------------+ FV DistalFull                                                             +---------+---------------+---------+-----------+----------+-------------------+ PFV      Full                                                              +---------+---------------+---------+-----------+----------+-------------------+ POP      Full                                                             +---------+---------------+---------+-----------+----------+-------------------+ PTV                                                   Not well                                                                  visualized- unable                                                        to appreciate flow                                                        with augment        +---------+---------------+---------+-----------+----------+-------------------+ PERO     Full  Not well visualized +---------+---------------+---------+-----------+----------+-------------------+   +---------+---------------+---------+-----------+----------+--------------+ LEFT     CompressibilityPhasicitySpontaneityPropertiesThrombus Aging +---------+---------------+---------+-----------+----------+--------------+ CFV      Full           Yes      Yes                                 +---------+---------------+---------+-----------+----------+--------------+ SFJ      Full                                                        +---------+---------------+---------+-----------+----------+--------------+ FV Prox  Full                                                        +---------+---------------+---------+-----------+----------+--------------+ FV Mid   Full                                                        +---------+---------------+---------+-----------+----------+--------------+ FV DistalFull                                                        +---------+---------------+---------+-----------+----------+--------------+ PFV      Full                                                         +---------+---------------+---------+-----------+----------+--------------+ POP      Full                                                        +---------+---------------+---------+-----------+----------+--------------+ PTV      Full                                                        +---------+---------------+---------+-----------+----------+--------------+ PERO     Full                                                        +---------+---------------+---------+-----------+----------+--------------+     Summary: RIGHT: - There is no evidence of deep vein thrombosis in the lower extremity. However, portions of this examination were limited- see technologist comments above.  - No cystic  structure found in the popliteal fossa.  LEFT: - There is no evidence of deep vein thrombosis in the lower extremity.  - No cystic structure found in the popliteal fossa.  *See table(s) above for measurements and observations.    Preliminary     Review of Systems  Constitutional:  Negative for chills and fever.  Genitourinary:  Positive for difficulty urinating and hematuria.  Neurological:  Positive for light-headedness.  All other systems reviewed and are negative. Blood pressure (!) 146/68, pulse 78, temperature 98.8 F (37.1 C), resp. rate 17, weight 94.8 kg, SpO2 100 %. Physical Exam Vitals reviewed.  Constitutional:      Comments: AOx3. Frankly rude. Very debilitated with large LE edema.Non-ambulatory. "Yall just bring me here to die"  Eyes:     Pupils: Pupils are equal, round, and reactive to light.  Cardiovascular:     Rate and Rhythm: Normal rate.  Pulmonary:     Effort: Pulmonary effort is normal.  Abdominal:     General: Abdomen is flat.  Genitourinary:    Comments: Uncirc'd. No phimosis. In situ foley with small hematuria, no large clots. Irrigates quantitiateivly.  Musculoskeletal:     Comments: Large symmetric LE edema.   Psychiatric:        Mood and Affect: Mood  normal.    Assessment/Plan:  1 - Urinary Retention / Very Large Prostate - rec begin finasteride daily as best medical therapy given his gland size. Given his very poor functional status he very well may remain catheter dependant going forwards. Ideally home health or facility catheter changes monthly. Consider retrial of void after several months finasteride.   2 - Gross Hematuria / Foley Malposition - repositioned at bedside and bladder irrigated corroborating proper placement.   Agree with alternate living arrangements as he is unable to perform ADL's or basic self care.   No procedural intervention warranted this admission. Please call with questions anytime.   Sebastian Ache 12/01/2020, 4:09 PM
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
## 2020-12-01 NOTE — Evaluation (Signed)
Physical Therapy Evaluation Patient Details Name: Edgar Mckay MRN: [id number] DOB: September 11, 1935 Today's Date: 12/01/2020  History of Present Illness  Pt is an 85 y/o male addmited 11/21 secondary to obstructed urinary catheter and maceration of groin area. PMH includes retention with foley (02/2019), BPH, hx of paraphimosis s/p reduction.  Clinical Impression  Pt admitted secondary to problem above with deficits below. Pt requiring mod A for bed mobility and to stand and take side steps at EOB. Pt with increased pain and very labored steps, so further mobility deferred. Pt currently lives alone and has increased difficulty caring for himself. Recommend SNF level therapies to increase independence and safety with mobility. Long term, would likely benefit from ALF setting. Will continue to follow acutely to maximize functional mobility independence and safety.        Recommendations for follow up therapy are one component of a multi-disciplinary discharge planning process, led by the attending physician.  Recommendations may be updated based on patient status, additional functional criteria and insurance authorization.  Follow Up Recommendations Skilled nursing-short term rehab (<3 hours/day)    Assistance Recommended at Discharge Frequent or constant Supervision/Assistance  Functional Status Assessment Patient has had a recent decline in their functional status and demonstrates the ability to make significant improvements in function in a reasonable and predictable amount of time.  Equipment Recommendations  Rolling walker (2 wheels);BSC/3in1    Recommendations for Other Services       Precautions / Restrictions Precautions Precautions: Fall Restrictions Weight Bearing Restrictions: No      Mobility  Bed Mobility Overal bed mobility: Needs Assistance Bed Mobility: Supine to Sit;Sit to Supine     Supine to sit: Mod assist Sit to supine: Mod assist   General bed mobility comments:  Mod A for LE assist and assist with scooting towards EOB. Increased time requried.    Transfers Overall transfer level: Needs assistance Equipment used: Straight cane Transfers: Sit to/from Stand Sit to Stand: Mod assist           General transfer comment: Mod A for lift assist to stand. took a couple side steps towards [facility], but very labored. Further mobility limited secondary to pain and fatigue.    Ambulation/Gait                  Stairs            Wheelchair Mobility    Modified Rankin (Stroke Patients Only)       Balance Overall balance assessment: Needs assistance Sitting-balance support: No upper extremity supported;Feet supported Sitting balance-Leahy Scale: Fair     Standing balance support: Single extremity supported;During functional activity Standing balance-Leahy Scale: Poor Standing balance comment: Reliant on UE and external support                             Pertinent Vitals/Pain Pain Assessment: Faces Faces Pain Scale: Hurts even more Pain Location: BLE Pain Descriptors / Indicators: Grimacing;Guarding Pain Intervention(s): Limited activity within patient's tolerance;Monitored during session;Repositioned    Home Living Family/patient expects to be discharged to:: Private residence Living Arrangements: Alone Available Help at Discharge: Family;Available PRN/intermittently Type of Home: House Home Access: Stairs to enter Entrance Stairs-Rails: None Entrance Stairs-Number of Steps: 3   Home Layout: One level Home Equipment: Cane - single point;Rollator (4 wheels)      Prior Function Prior Level of Function : Independent/Modified Independent  Mobility Comments: Using cane for mobility, but reports he has not been ambulating much here lately ADLs Comments: Having difficulty picking things up     Hand Dominance        Extremity/Trunk Assessment   Upper Extremity Assessment Upper Extremity  Assessment: Defer to OT evaluation    Lower Extremity Assessment Lower Extremity Assessment: Generalized weakness (Severe swelling in BLE)    Cervical / Trunk Assessment Cervical / Trunk Assessment: Kyphotic  Communication   Communication: HOH  Cognition Arousal/Alertness: Awake/alert Behavior During Therapy: Flat affect Overall Cognitive Status: No family/caregiver present to determine baseline cognitive functioning                                 General Comments: Decreased awareness of deficits.        General Comments      Exercises     Assessment/Plan    PT Assessment Patient needs continued PT services  PT Problem List Decreased strength;Decreased activity tolerance;Decreased balance;Decreased mobility;Decreased knowledge of use of DME;Decreased knowledge of precautions       PT Treatment Interventions DME instruction;Gait training;Functional mobility training;Therapeutic activities;Therapeutic exercise;Balance training;Patient/family education    PT Goals (Current goals can be found in the Care Plan section)  Acute Rehab PT Goals Patient Stated Goal: to feel better PT Goal Formulation: With patient Time For Goal Achievement: 12/15/20 Potential to Achieve Goals: Good    Frequency Min 2X/week   Barriers to discharge        Co-evaluation               AM-PAC PT "6 Clicks" Mobility  Outcome Measure Help needed turning from your back to your side while in a flat bed without using bedrails?: A Lot Help needed moving from lying on your back to sitting on the side of a flat bed without using bedrails?: A Lot Help needed moving to and from a bed to a chair (including a wheelchair)?: A Lot Help needed standing up from a chair using your arms (e.g., wheelchair or bedside chair)?: A Lot Help needed to walk in hospital room?: Total Help needed climbing 3-5 steps with a railing? : Total 6 Click Score: 10    End of Session Equipment Utilized  During Treatment: Gait belt Activity Tolerance: Patient limited by fatigue Patient left: in bed;with call bell/phone within reach (on stretcher in ED) Nurse Communication: Mobility status PT Visit Diagnosis: Muscle weakness (generalized) (M62.81);Unsteadiness on feet (R26.81)    Time: 7414-2395 PT Time Calculation (min) (ACUTE ONLY): 16 min   Charges:   PT Evaluation $PT Eval Moderate Complexity: 1 Mod          Farley Ly, PT, DPT  Acute Rehabilitation Services  Pager: [phone] Office: [phone]   Lehman Prom 12/01/2020, 2:51 PM

## 2020-12-01 NOTE — Progress Notes (Signed)
Lower extremity venous bilateral study completed.   Please see CV Proc for preliminary results.   Nataniel Gasper, RDMS, RVT  

## 2020-12-02 DIAGNOSIS — T83091D Other mechanical complication of indwelling urethral catheter, subsequent encounter: Secondary | ICD-10-CM

## 2020-12-02 DIAGNOSIS — N3001 Acute cystitis with hematuria: Secondary | ICD-10-CM | POA: Diagnosis not present

## 2020-12-02 DIAGNOSIS — Z659 Problem related to unspecified psychosocial circumstances: Secondary | ICD-10-CM | POA: Diagnosis not present

## 2020-12-02 LAB — HSV DNA BY PCR (REFERENCE LAB)
HSV 1 DNA: NEGATIVE
HSV 2 DNA: NEGATIVE

## 2020-12-02 MED ORDER — POLYETHYLENE GLYCOL 3350 17 G PO PACK
17.0000 g | PACK | Freq: Every day | ORAL | Status: DC
  Administered 2020-12-02 – 2020-12-04 (×3): 17 g via ORAL
  Filled 2020-12-02 (×3): qty 1

## 2020-12-02 MED ORDER — SENNOSIDES-DOCUSATE SODIUM 8.6-50 MG PO TABS
1.0000 | ORAL_TABLET | Freq: Every day | ORAL | Status: DC
  Administered 2020-12-02 – 2020-12-04 (×3): 1 via ORAL
  Filled 2020-12-02 (×3): qty 1

## 2020-12-02 MED ORDER — CEPHALEXIN 500 MG PO CAPS
500.0000 mg | ORAL_CAPSULE | Freq: Four times a day (QID) | ORAL | Status: AC
  Administered 2020-12-02 – 2020-12-05 (×12): 500 mg via ORAL
  Filled 2020-12-02 (×12): qty 1

## 2020-12-02 MED ORDER — CHLORHEXIDINE GLUCONATE CLOTH 2 % EX PADS
6.0000 | MEDICATED_PAD | Freq: Every day | CUTANEOUS | Status: DC
  Administered 2020-12-02 – 2020-12-08 (×7): 6 via TOPICAL
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
## 2020-12-02 NOTE — NC FL2 (Signed)
[place] MEDICAID FL2 LEVEL OF CARE SCREENING TOOL     IDENTIFICATION  Patient Name: Edgar Mckay Birthdate: 10-07-1935 Sex: male Admission Date (Current Location): 11/30/2020  [facility] and [place] Number:  Producer, television/film/video and Address:  [facility] [place], [address] [place], [place] [address]      Provider Number: [id number]  Attending Physician Name and Address:  Moses Manners, MD  Relative Name and Phone Number:       Current Level of Care: Hospital Recommended Level of Care: Skilled Nursing Facility Prior Approval Number:    Date Approved/Denied:   PASRR Number: [id number]  Discharge Plan: SNF    Current Diagnoses: Patient Active Problem List   Diagnosis Date Noted   Acute cystitis with hematuria    Poor social situation    Obstruction of Foley catheter (HCC) 11/30/2020    Orientation RESPIRATION BLADDER Height & Weight     Self, Time, Situation, Place  Normal Incontinent, External catheter Weight: 207 lb 3.7 oz (94 kg) Height:     BEHAVIORAL SYMPTOMS/MOOD NEUROLOGICAL BOWEL NUTRITION STATUS      Continent Diet  AMBULATORY STATUS COMMUNICATION OF NEEDS Skin   Extensive Assist Verbally Normal, Skin abrasions                       Personal Care Assistance Level of Assistance  Bathing, Feeding, Dressing Bathing Assistance: Limited assistance Feeding assistance: Independent Dressing Assistance: Limited assistance     Functional Limitations Info  Sight, Hearing, Speech Sight Info: Impaired Hearing Info: Impaired Speech Info: Adequate    SPECIAL CARE FACTORS FREQUENCY  PT (By licensed PT), OT (By licensed OT)     PT Frequency: 5x a week OT Frequency: 5x a week            Contractures Contractures Info: Not present    Additional Factors Info  Code Status, Allergies Code Status Info: DNR Allergies Info: NKA           Current Medications (12/02/2020):  This is the current hospital active medication  list Current Facility-Administered Medications  Medication Dose Route Frequency Provider Last Rate Last Admin   acetaminophen (TYLENOL) tablet 650 mg  650 mg Oral Q6H PRN Princess Bruins, DO   650 mg at 12/01/20 2019   Or   acetaminophen (TYLENOL) suppository 650 mg  650 mg Rectal Q6H PRN Princess Bruins, DO       barrier cream (non-specified) 1 application  1 application Topical BID Levin Erp, MD   1 application at 12/02/20 0912   cephALEXin (KEFLEX) capsule 500 mg  500 mg Oral Q6H Brimage, Vondra, DO   500 mg at 12/02/20 1131   Chlorhexidine Gluconate Cloth 2 % PADS 6 each  6 each Topical Daily Moses Manners, MD   6 each at 12/02/20 0912   enoxaparin (LOVENOX) injection 40 mg  40 mg Subcutaneous Q24H Princess Bruins, DO   40 mg at 12/01/20 2019   finasteride (PROSCAR) tablet 5 mg  5 mg Oral Daily Sebastian Ache, MD   5 mg at 12/02/20 0938   folic acid (FOLVITE) tablet 1 mg  1 mg Oral Daily Princess Bruins, DO   1 mg at 12/02/20 1829   multivitamin with minerals tablet 1 tablet  1 tablet Oral Daily Princess Bruins, DO   1 tablet at 12/02/20 9371   nicotine (NICODERM CQ - dosed in mg/24 hours) patch 21 mg  21 mg Transdermal Q24H Sabino Dick, DO  21 mg at 12/01/20 2021   polyethylene glycol (MIRALAX / GLYCOLAX) packet 17 g  17 g Oral Daily Holley Bouche, MD   17 g at 12/02/20 1131   senna-docusate (Senokot-S) tablet 1 tablet  1 tablet Oral Daily Holley Bouche, MD   1 tablet at 12/02/20 1131   tamsulosin (FLOMAX) capsule 0.4 mg  0.4 mg Oral Daily Gerrit Heck, MD   0.4 mg at 12/02/20 [id number]   thiamine tablet 100 mg  100 mg Oral Daily Merrily Brittle, DO   100 mg at 12/02/20 [id number]   vitamin B-12 (CYANOCOBALAMIN) tablet 1,000 mcg  1,000 mcg Oral Daily Wells Guiles, DO   1,000 mcg at 12/02/20 0911     Discharge Medications: Please see discharge summary for a list of discharge medications.  Relevant Imaging Results:  Relevant Lab Results:   Additional Information SSN;  [id number]  Emeterio Reeve, LCSW

## 2020-12-02 NOTE — Progress Notes (Signed)
FPTS Brief Progress Note  S: Patient seen in room sleeping comfortably.  Did not awaken.  There is about 250 cc of urine output in Foley bag.   Discussed with RN who voiced no concerns.  O: BP 122/65 (BP Location: Left Arm)   Pulse 70   Temp 98 F (36.7 C)   Resp 17   Wt 94.8 kg   SpO2 99%   BMI 30.86 kg/m   Gen: sleeping comfortably, no acute distress Resp: Normal work of breathing GU: foley in place. 250cc in foley bag. Urine color appears as a mixture of fruit punch and iced tea.   A/P: Urinary Obstruction: Improved BPH Evaluated by urology today.  Started on finasteride in addition to his tamsulosin.  Appears to be having good urine output. Urology notes that the hemoglobinuria likely from trauma with the foley catheter. -Strict I/O ordered to assess output -Continue q8h bladder scans to ensure not retaining  UTI Urine culture returned with multiple species present and suggested recollection. Placed order for recollection. Patient has been on antibiotics so this may affect culture.  -Continue CTX  -Monitor clinically   Additional A&P per day team progress note   - Orders reviewed. Labs for AM not ordered, which is appropriate.   Sabino Dick, DO 12/02/2020, 12:24 AM PGY-2, [place] [facility] Night Resident  Please page [phone] with questions.

## 2020-12-02 NOTE — Progress Notes (Signed)
Occupational Therapy Evaluation Patient Details Name: Edgar Mckay MRN: [id number] DOB: October 11, 1935 Today's Date: 12/02/2020   History of Present Illness Pt is an 85 y/o male admitted 11/21 secondary to obstructed urinary catheter and maceration of groin area. PMH includes retention with foley (02/2019), BPH, hx of paraphimosis s/p reduction.   Clinical Impression   Pt independent with ADLs at baseline, uses Rollator and SPC for mobility. Lives alone with PRN assistance, house not entirely accessible via Rollator, pt reports using SPC in bathroom and places where Rollator will not fit. Pt sponge bathes on couch at baseline and reports he does not ambulate far within his home. Pt A and O x4 during session, min-max A for ADLs, mod A for bed mobility to assist LE's to floor during session, required 2 attempts to stand with RW due to LE weakness, min A for step pivot transfer to recliner. Pt requires verbal cuing for hand placement and safety during transfers. Pt limited by decreased balance, activity tolerance, strength, and safety awareness at this time, will continue to follow acutely to maximize safety with ADLs. Recommend d/c to SNF.     Recommendations for follow up therapy are one component of a multi-disciplinary discharge planning process, led by the attending physician.  Recommendations may be updated based on patient status, additional functional criteria and insurance authorization.   Follow Up Recommendations  Skilled nursing-short term rehab (<3 hours/day)    Assistance Recommended at Discharge Frequent or constant Supervision/Assistance  Functional Status Assessment  Patient has had a recent decline in their functional status and demonstrates the ability to make significant improvements in function in a reasonable and predictable amount of time.  Equipment Recommendations  Other (comment) (defer to next venue of care)    Recommendations for Other Services PT consult      Precautions / Restrictions Precautions Precautions: Fall Restrictions Weight Bearing Restrictions: No      Mobility Bed Mobility Overal bed mobility: Needs Assistance Bed Mobility: Supine to Sit     Supine to sit: Mod assist;HOB elevated     General bed mobility comments: Assist to bring LE's to EOB, able to scoot laterally with mod verbal cuing    Transfers Overall transfer level: Needs assistance Equipment used: Rolling walker (2 wheels) Transfers: Bed to chair/wheelchair/BSC;Sit to/from Stand Sit to Stand: Min assist;From elevated surface     Step pivot transfers: Min assist     General transfer comment: required verbal cuing for hand placement/safety      Balance Overall balance assessment: Needs assistance Sitting-balance support: No upper extremity supported;Feet supported Sitting balance-Leahy Scale: Fair Sitting balance - Comments: holds bedrail for support sitting EOB   Standing balance support: Bilateral upper extremity supported;During functional activity;Reliant on assistive device for balance Standing balance-Leahy Scale: Poor Standing balance comment: able to take steps in place when standing                           ADL either performed or assessed with clinical judgement   ADL Overall ADL's : Needs assistance/impaired Eating/Feeding: Set up;Sitting   Grooming: Set up;Sitting   Upper Body Bathing: Minimal assistance;Sitting   Lower Body Bathing: Moderate assistance;Sitting/lateral leans   Upper Body Dressing : Minimal assistance;Sitting   Lower Body Dressing: Moderate assistance;Sitting/lateral leans   Toilet Transfer: Minimal assistance;BSC/3in1;Rolling walker (2 wheels) (simulated to chair)   Toileting- Clothing Manipulation and Hygiene: Moderate assistance;Sit to/from stand   Tub/ Shower Transfer: Maximal assistance;Tub transfer;Tub bench;Rolling walker (2  wheels)   Functional mobility during ADLs: Minimal  assistance;Rolling walker (2 wheels);Cueing for safety General ADL Comments: Pt requires increased time, denies pain during session but generally feels weak,     Vision   Vision Assessment?: No apparent visual deficits     Perception     Praxis      Pertinent Vitals/Pain Pain Assessment: No/denies pain     Hand Dominance     Extremity/Trunk Assessment Upper Extremity Assessment Upper Extremity Assessment: Generalized weakness   Lower Extremity Assessment Lower Extremity Assessment: Defer to PT evaluation   Cervical / Trunk Assessment Cervical / Trunk Assessment: Kyphotic   Communication Communication Communication: HOH   Cognition Arousal/Alertness: Awake/alert Behavior During Therapy: WFL for tasks assessed/performed Overall Cognitive Status: Within Functional Limits for tasks assessed                                 General Comments: A and O x4     General Comments  reports that LE's are swollen and it feels as though he is "standing on golf balls", RN in room during conversation -- notified    Exercises     Shoulder Instructions      Home Living Family/patient expects to be discharged to:: Private residence Living Arrangements: Alone Available Help at Discharge: Family;Available PRN/intermittently Type of Home: House Home Access: Stairs to enter [employer] of Steps: 3 Entrance Stairs-Rails: None Home Layout: One level     Bathroom Shower/Tub: Chief Strategy Officer: Standard Bathroom Accessibility: No   Home Equipment: Cane - single point;Rollator (4 wheels)   Additional Comments: sponge bathes at baseline, sitting on the couch, pt reports house is not Rollator accessible -- uses SPC in bathroom/spaces of home that Rollator will not fit      Prior Functioning/Environment Prior Level of Function : Independent/Modified Independent             Mobility Comments: Using cane for mobility, but reports he has  not been ambulating much here lately ADLs Comments: Having difficulty picking things up        OT Problem List: Decreased strength;Decreased range of motion;Decreased activity tolerance;Impaired balance (sitting and/or standing);Decreased safety awareness      OT Treatment/Interventions: Self-care/ADL training;Therapeutic exercise;DME and/or AE instruction;Therapeutic activities;Patient/family education    OT Goals(Current goals can be found in the care plan section) Acute Rehab OT Goals Patient Stated Goal: return to PLOF OT Goal Formulation: With patient Time For Goal Achievement: 12/16/20 Potential to Achieve Goals: Fair ADL Goals Pt Will Perform Upper Body Dressing: with set-up;sitting Pt Will Perform Lower Body Dressing: with set-up;sitting/lateral leans Pt Will Transfer to Toilet: ambulating;bedside commode;with min assist Pt Will Perform Tub/Shower Transfer: rolling walker;tub bench;with min assist  OT Frequency: Min 2X/week   Barriers to D/C:            Co-evaluation              AM-PAC OT "6 Clicks" Daily Activity     Outcome Measure Help from another person eating meals?: None Help from another person taking care of personal grooming?: A Little Help from another person toileting, which includes using toliet, bedpan, or urinal?: A Lot Help from another person bathing (including washing, rinsing, drying)?: A Lot Help from another person to put on and taking off regular upper body clothing?: A Little Help from another person to put on and taking off regular lower body clothing?: A  Lot 6 Click Score: 16   End of Session Equipment Utilized During Treatment: Gait belt;Rolling walker (2 wheels) Nurse Communication: Mobility status;Other (comment) (d/c plan)  Activity Tolerance: Patient limited by fatigue Patient left: in chair;with chair alarm set;with call bell/phone within reach  OT Visit Diagnosis: Unsteadiness on feet (R26.81);Other abnormalities of gait and  mobility (R26.89);Muscle weakness (generalized) (M62.81);Pain                Time: 1791-5056 OT Time Calculation (min): 34 min Charges:  OT General Charges $OT Visit: 1 Visit OT Evaluation $OT Eval Low Complexity: 1 Low OT Treatments $Self Care/Home Management : 8-22 mins  Alfonzo Beers, OTD, OTR/L Acute Rehab [phone]) [phone]   Mayer Masker 12/02/2020, 9:45 AM
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
## 2020-12-02 NOTE — Care Management Important Message (Signed)
Important Message  Patient Details  Name: Edgar Mckay MRN: [id number] Date of Birth: [birth year]   Medicare Important Message Given:  Yes     Linwood Gullikson 12/02/2020, 4:11 PM
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
## 2020-12-02 NOTE — Care Management Important Message (Signed)
Important Message  Patient Details  Name: Edgar Mckay MRN: [id number] Date of Birth: 06-08-1935   Medicare Important Message Given:  Yes     Wadie Lessen 12/02/2020, 11:04 AM

## 2020-12-02 NOTE — TOC Initial Note (Signed)
Transition of Care [facility]) - Initial/Assessment Note    Patient Details  Name: Edgar Mckay MRN: [id number] Date of Birth: Aug 24, 1935  Transition of Care [facility]) CM/SW Contact:    Emeterio Reeve, LCSW Phone Number: 12/02/2020, 12:55 PM  Clinical Narrative:                 CSW received SNF consult. CSW met with pt at bedside. CSW introduced self and explained role at the hospital. Pt reports that PTA he lived at home alone. Pt used a walker for mobility. Pt was independent for all ADL's. Pt reports his children check in on him every couple of days and take him grocery shopping.   CSW reviewed PT/OT recommendations for SNF. Pt reports he needs to go so he can return back home. Pt gave CSW permission to fax out to facilities in the area. Pt has no preference of facility at this time. CSW gave pt medicare.gov rating list to review. CSW explained insurance auth process. Pt reports he is unsure if he is covid vaccinated.  CSW will continue to follow.   Expected Discharge Plan: Skilled Nursing Facility Barriers to Discharge: Continued Medical Work up   Patient Goals and CMS Choice Patient states their goals for this hospitalization and ongoing recovery are:: to get strong enough to go home CMS Medicare.gov Compare Post Acute Care list provided to:: Patient Choice offered to / list presented to : Patient  Expected Discharge Plan and Services Expected Discharge Plan: [place] arrangements for the past 2 months: Single Family Home                                      Prior Living Arrangements/Services Living arrangements for the past 2 months: Single Family Home Lives with:: Self Patient language and need for interpreter reviewed:: Yes Do you feel safe going back to the place where you live?: Yes      Need for Family Participation in Patient Care: Yes (Comment) Care giver support system in place?: Yes (comment)   Criminal Activity/Legal Involvement  Pertinent to Current Situation/Hospitalization: No - Comment as needed  Activities of Daily Living      Permission Sought/Granted Permission sought to share information with : Chartered certified accountant granted to share information with : Yes, Verbal Permission Granted     Permission granted to share info w AGENCY: SNF        Emotional Assessment Appearance:: Appears stated age Attitude/Demeanor/Rapport: Engaged Affect (typically observed): Appropriate Orientation: : Oriented to Self, Oriented to Place, Oriented to  Time, Oriented to Situation Alcohol / Substance Use: Not Applicable Psych Involvement: No (comment)  Admission diagnosis:  Mechanical complication due to urethral (indwelling) catheter ([place]) [T83.091A] Cellulitis of other specified site [L03.818] Patient Active Problem List   Diagnosis Date Noted   Acute cystitis with hematuria    Poor social situation    Obstruction of Foley catheter ([place]) 11/30/2020   PCP:  Pcp, No Pharmacy:   [place] [address] Phone: [phone] Fax: [phone]  [id number] [place], [place] [address] [place] [address] Phone: [phone] Fax: [phone]     Social Determinants of Health (SDOH) Interventions    Readmission Risk Interventions No flowsheet data found.  Addelyn Alleman  , LCSW Clinical Social Worker] 

## 2020-12-03 DIAGNOSIS — T83091D Other mechanical complication of indwelling urethral catheter, subsequent encounter: Secondary | ICD-10-CM | POA: Diagnosis not present

## 2020-12-03 LAB — URINE CULTURE: Culture: NO GROWTH

## 2020-12-03 NOTE — Progress Notes (Signed)
   12/03/20 1437  Vitals  Temp 98.9 F (37.2 C)  Temp Source Oral  BP (!) 144/67  MAP (mmHg) 86  BP Location Right Arm  BP Method Automatic  Patient Position (if appropriate) Lying  Pulse Rate 81  Pulse Rate Source Monitor  Resp 20  MEWS COLOR  MEWS Score Color Green  Oxygen Therapy  SpO2 100 %  MEWS Score  MEWS Temp 0  MEWS Systolic 0  MEWS Pulse 0  MEWS RR 0  MEWS LOC 0  MEWS Score 0  Pt c/o dyspnea while resting in bed, vitals checked as above. Also c/o abdominal pain. 2 liters oxygen initiated despite oxygen sats being 100%. MD paged

## 2020-12-03 NOTE — Progress Notes (Signed)
Called to patient room regarding complaints of shortness of breath per RN. Patients that when he woke up he started having some difficulty with catching his breath, which scared him. He was put on a small amount of oxygen and feels better. He did not have any desaturations and no tachypnea or tachycardia with the episode; [place] with 1L O2 was placed for patient comfort. Unsure of exact etiology, but symptoms had resolved by the time I had arrived at bedside. Patient is overall stable and do not feel that there is need for further work-up for pneumonia or pulmonary embolus. Will continue to monitor respiratory status  Blood pressure (!) 144/67, pulse 81, temperature 98.9 F (37.2 C), temperature source Oral, resp. rate 20, weight 94.1 kg, SpO2 100 %. General: chronically ill-appearing male, NAD Respiratory: CTAB, normal WOB, [place] in place on 1L (for patient comfort, not oxygen saturations) CV: RRR Abdomen: soft, mild distension, diffuse mild tenderness without rebound or guarding

## 2020-12-04 DIAGNOSIS — L03818 Cellulitis of other sites: Secondary | ICD-10-CM | POA: Diagnosis not present

## 2020-12-04 LAB — URINE CYTOLOGY ANCILLARY ONLY
Candida Urine: NEGATIVE
Chlamydia: NEGATIVE
Comment: NEGATIVE
Comment: NEGATIVE
Comment: NORMAL
Neisseria Gonorrhea: NEGATIVE
Trichomonas: NEGATIVE

## 2020-12-04 LAB — T.PALLIDUM AB, TOTAL: T Pallidum Abs: REACTIVE — AB
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
## 2020-12-04 NOTE — TOC Progression Note (Signed)
Transition of Care [facility]) - Progression Note    Patient Details  Name: Edgar Mckay MRN: [id number] Date of Birth: October 09, 1935  Transition of Care [facility]) CM/SW Contact  Jimmy Picket, [place] Phone Number: 12/04/2020, 4:17 PM  Clinical Narrative:     CSW gave bed offers to pt. Pt states he cant read it without his glasses. Pt gave CSW to call sons. Pt stated he will go wherever they pick. CSW was unable to get either son on the phone.   Expected Discharge Plan: Skilled Nursing Facility Barriers to Discharge: Continued Medical Work up  Expected Discharge Plan and Services Expected Discharge Plan: Skilled Nursing Facility       Living arrangements for the past 2 months: Single Family Home                                       Social Determinants of Health (SDOH) Interventions    Readmission Risk Interventions No flowsheet data found.  Jimmy Picket, LCSW Clinical Social Worker

## 2020-12-04 NOTE — Progress Notes (Signed)
Physical Therapy Treatment Patient Details Name: Edgar Mckay MRN: [id number] DOB: Apr 28, 1935 Today's Date: 12/04/2020   History of Present Illness Pt is an 85 y/o male addmited 11/21 secondary to obstructed urinary catheter and maceration of groin area. PMH includes retention with foley (02/2019), BPH, hx of paraphimosis s/p reduction.    PT Comments    Pt was seen for progression of gait to transfer to the chair, but was tired and struggling to tolerate mobility. Pt is in pain on R knee, and with help to get to side of bed and to stand, then sidestep, could be assisted to the chair. Son is there to trim pt's beard, and was able to care for him better in the chair.  Pt is positioned with legs supported, and recommended to him to continue on with therapy to work on more gait as his pain permits.  SNF recommended due to his need for more help to stand and walk, as well as 24/7 care to manage pain and safety of movement.  Pt is higher fall risk for these needs.   Recommendations for follow up therapy are one component of a multi-disciplinary discharge planning process, led by the attending physician.  Recommendations may be updated based on patient status, additional functional criteria and insurance authorization.  Follow Up Recommendations  Skilled nursing-short term rehab (<3 hours/day)     Assistance Recommended at Discharge Frequent or constant Supervision/Assistance  Equipment Recommendations  Rolling walker (2 wheels);BSC/3in1    Recommendations for Other Services       Precautions / Restrictions Precautions Precautions: Fall Precaution Comments: R knee is red and warm Restrictions Weight Bearing Restrictions: No Other Position/Activity Restrictions: tolerates only protected WB on R knee     Mobility  Bed Mobility Overal bed mobility: Needs Assistance Bed Mobility: Supine to Sit     Supine to sit: Mod assist;HOB elevated          Transfers Overall transfer level:  Needs assistance Equipment used: Rolling walker (2 wheels);1 person hand held assist Transfers: Sit to/from Stand;Bed to chair/wheelchair/BSC Sit to Stand: Mod assist;From elevated surface     Step pivot transfers: Min assist     General transfer comment: slow pace and cues for sequence    Ambulation/Gait               General Gait Details: steps were to do the transfers   Stairs             Wheelchair Mobility    Modified Rankin (Stroke Patients Only)       Balance Overall balance assessment: Needs assistance Sitting-balance support: Feet supported Sitting balance-Leahy Scale: Fair     Standing balance support: Bilateral upper extremity supported;During functional activity Standing balance-Leahy Scale: Poor                              Cognition Arousal/Alertness: Awake/alert Behavior During Therapy: WFL for tasks assessed/performed Overall Cognitive Status: Within Functional Limits for tasks assessed                                          Exercises      General Comments General comments (skin integrity, edema, etc.): pt is in pain on R knee but has been this way since admission.  Assisted with RW and slow cued pacing of transfer to chair  with pt assisted to move RLE out for reduction in pain to sit      Pertinent Vitals/Pain Pain Assessment: Faces Faces Pain Scale: Hurts even more Pain Location: R knee Pain Descriptors / Indicators: Grimacing;Guarding Pain Intervention(s): Limited activity within patient's tolerance;Monitored during session;Premedicated before session;Repositioned    Home Living                          Prior Function            PT Goals (current goals can now be found in the care plan section) Acute Rehab PT Goals Patient Stated Goal: to feel better Progress towards PT goals: Progressing toward goals    Frequency    Min 2X/week      PT Plan Current plan remains  appropriate    Co-evaluation              AM-PAC PT "6 Clicks" Mobility   Outcome Measure  Help needed turning from your back to your side while in a flat bed without using bedrails?: A Lot Help needed moving from lying on your back to sitting on the side of a flat bed without using bedrails?: A Lot Help needed moving to and from a bed to a chair (including a wheelchair)?: A Lot Help needed standing up from a chair using your arms (e.g., wheelchair or bedside chair)?: A Lot Help needed to walk in hospital room?: A Little Help needed climbing 3-5 steps with a railing? : Total 6 Click Score: 12    End of Session Equipment Utilized During Treatment: Gait belt Activity Tolerance: Patient limited by fatigue;Patient limited by pain Patient left: in chair;with call bell/phone within reach;with chair alarm set;with nursing/sitter in room;with family/visitor present Nurse Communication: Mobility status PT Visit Diagnosis: Muscle weakness (generalized) (M62.81);Unsteadiness on feet (R26.81)     Time: 0998-3382 PT Time Calculation (min) (ACUTE ONLY): 28 min  Charges:  $Therapeutic Activity: 23-37 mins        Ivar Drape 12/04/2020, 7:15 PM  Samul Dada, PT PhD Acute Rehab Dept. Number: [facility] [id number] and [facility] [phone]

## 2020-12-05 DIAGNOSIS — M6281 Muscle weakness (generalized): Secondary | ICD-10-CM | POA: Diagnosis not present

## 2020-12-05 DIAGNOSIS — R339 Retention of urine, unspecified: Secondary | ICD-10-CM | POA: Diagnosis present

## 2020-12-05 DIAGNOSIS — R54 Age-related physical debility: Secondary | ICD-10-CM

## 2020-12-05 DIAGNOSIS — N3001 Acute cystitis with hematuria: Secondary | ICD-10-CM | POA: Diagnosis not present

## 2020-12-05 DIAGNOSIS — R2681 Unsteadiness on feet: Secondary | ICD-10-CM | POA: Diagnosis present

## 2020-12-05 LAB — CULTURE, BLOOD (SINGLE)
Culture: NO GROWTH
Special Requests: ADEQUATE

## 2020-12-05 MED ORDER — ACETAMINOPHEN 325 MG PO TABS: 650.0000 mg | ORAL_TABLET | Freq: Four times a day (QID) | ORAL | Status: DC | PRN

## 2020-12-05 MED ORDER — TAMSULOSIN HCL 0.4 MG PO CAPS: 0.4000 mg | ORAL_CAPSULE | Freq: Every day | ORAL | 0 refills | Status: DC

## 2020-12-05 MED ORDER — CYANOCOBALAMIN 1000 MCG PO TABS: 1000.0000 ug | ORAL_TABLET | Freq: Every day | ORAL | Status: DC

## 2020-12-05 MED ORDER — FINASTERIDE 5 MG PO TABS: 5.0000 mg | ORAL_TABLET | Freq: Every day | ORAL | Status: DC

## 2020-12-05 MED ORDER — NICOTINE 21 MG/24HR TD PT24: 21.0000 mg | MEDICATED_PATCH | TRANSDERMAL | 0 refills | Status: DC

## 2020-12-05 MED ORDER — THIAMINE HCL 100 MG PO TABS: 100.0000 mg | ORAL_TABLET | Freq: Every day | ORAL | Status: DC

## 2020-12-05 MED ORDER — FOLIC ACID 1 MG PO TABS: 1.0000 mg | ORAL_TABLET | Freq: Every day | ORAL | Status: DC

## 2020-12-05 MED ORDER — SENNA 8.6 MG PO TABS
2.0000 | ORAL_TABLET | Freq: Every day | ORAL | Status: DC
  Administered 2020-12-05 – 2020-12-08 (×3): 17.2 mg via ORAL
  Filled 2020-12-05 (×3): qty 2

## 2020-12-05 MED ORDER — ADULT MULTIVITAMIN W/MINERALS CH: 1.0000 | ORAL_TABLET | Freq: Every day | ORAL | Status: DC

## 2020-12-05 MED ORDER — POLYETHYLENE GLYCOL 3350 17 G PO PACK: 17.0000 g | PACK | Freq: Every day | ORAL | 0 refills | Status: DC | PRN

## 2020-12-05 MED ORDER — BARRIER CREAM NON-SPECIFIED
1.0000 | TOPICAL_CREAM | Freq: Two times a day (BID) | TOPICAL | Status: DC
Start: 2020-12-05 — End: 2021-03-25

## 2020-12-05 MED ORDER — POLYETHYLENE GLYCOL 3350 17 G PO PACK
17.0000 g | PACK | Freq: Two times a day (BID) | ORAL | Status: DC
  Administered 2020-12-05 – 2020-12-06 (×2): 17 g via ORAL
  Filled 2020-12-05 (×6): qty 1
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
## 2020-12-05 NOTE — Progress Notes (Signed)
FPTS Brief Note Reviewed patient's vitals, recent notes.  Vitals:   12/04/20 0800 12/04/20 2035  BP: (!) 128/55 128/63  Pulse: 77 80  Resp: 20 18  Temp: 97.6 F (36.4 C) 98.9 F (37.2 C)  SpO2: 100% 99%   Appears SW had bed offers but was unable to reach his sons to figure out facility preference (patient preferred sons to decide). Stable for d/c for SNF.  At this time, no change in plan from day progress note.  Sabino Dick, DO Page [phone] with questions about this patient.

## 2020-12-05 NOTE — TOC Progression Note (Signed)
Transition of Care [facility]) - Progression Note    Patient Details  Name: Edgar Mckay MRN: [id number] Date of Birth: [birth year]  Transition of [facility]) CM/SW Contact  Taneshia Lorence, [place], [place] Phone [phone] 12/05/2020, 1:40 PM  Clinical Narrative:    Met with patient at bedside, patient agreeable to [facility]. Phone call Helene Kelp to discuss transfer. She will call this social worker back to inform this Education officer, museum of bed availability .  [address], LCSW Transition of Care [phone]    Expected Discharge Plan: Skilled Nursing Facility Barriers to Discharge: Continued Medical Work up  Expected Discharge Plan and Services Expected Discharge Plan: [place] arrangements for the past 2 months: Single Family Home                                       Social Determinants of Health (SDOH) Interventions    Readmission Risk Interventions No flowsheet data found.

## 2020-12-05 NOTE — Discharge Instructions (Addendum)
Dear Edgar Mckay,  Thank you for letting us participate in your care. You were hospitalized for an obstructed Foley catheter and urinary tract infection.  While in the hospital, you had your Foley catheter replaced.  It has since been draining properly.  You are also treated for your urinary tract infection with a 5-day course of antibiotics.  You have been started on 2 medications to help you urinate better, these medications are Flomax and finasteride.  Please continue to take them daily.  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  You are given a one-time dose of penicillin for this.    POST-HOSPITAL & CARE INSTRUCTIONS Please establish with a primary care provider.  You should have foley catheter changes monthly. Seek medical attention if you start to develop fevers, decreased urinary output, blood in your urine, or other concerning symptoms to you.  You should follow up outpatient with Urology.  You have seen Dr. Vernie Ammons in the past.  Below is his contact information, please contact the office for an appointment. [address] [place], [place], [place] [address]. [phone]    DOCTOR'S APPOINTMENT   No future appointments.  Follow-up Information     Ihor Gully, MD. Call.   Specialty: Urology Why: Please call to schedule an appointment for follow up. Contact information: [address] [place] [address] [phone]                 Take care and be well!  Family Medicine Teaching Service Inpatient Team [facility]  [address] [place], [place] [address] [phone]

## 2020-12-05 NOTE — Discharge Summary (Addendum)
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      73.6 ml RIGHT VENTRICLE             IVC RV Basal diam:  3.20 cm     IVC diam: 2.10 cm RV Mid diam:    2.30 cm RV S prime:     13.40 cm/s TAPSE (M-mode): 1.9 cm LEFT ATRIUM             Index        RIGHT ATRIUM           Index LA diam:        3.50 cm 1.66 cm/m   RA Area:     14.10 cm LA Vol (A2C):   46.4 ml 22.04 ml/m  RA Volume:   34.60 ml  16.44 ml/m LA Vol (A4C):   57.4 ml 27.27 ml/m LA Biplane Vol: 54.6 ml 25.94 ml/m  AORTIC VALVE AV Area (Vmax): 2.56 cm AV Vmax:        162.00 cm/s AV Peak Grad:   10.5 mmHg LVOT Vmax:      132.00 cm/s LVOT Vmean:     90.100 cm/s LVOT VTI:       0.237 m  AORTA Ao Root diam: 3.60 cm Ao Asc diam:  3.60 cm MITRAL VALVE               TRICUSPID VALVE MV Area (PHT): 3.45 cm    TR Peak grad:   18.8 mmHg MV Decel Time: 220 msec    TR Vmax:        217.00 cm/s MV E velocity: 74.60 cm/s MV A velocity: 93.80 cm/s  SHUNTS MV E/A ratio:  0.80        Systemic VTI:  0.24 m                            Systemic Diam: 2.00 cm Dalton McleanMD Electronically signed by Franki Monte Signature Date/Time: 12/01/2020/5:38:03 PM    Final    VAS [place] LOWER EXTREMITY VENOUS (DVT)  Result Date: 12/01/2020  Lower Venous DVT Study Patient Name:  Edgar Mckay  Date of Exam:   12/01/2020 Medical Rec #: [id number]    Accession #:    [id number] Date of Birth: 10/28/1935    Patient Gender: M Patient Age:   72 years Exam Location:  [facility] Procedure:      VAS [place] LOWER EXTREMITY VENOUS (DVT) Referring Phys: Marda Stalker --------------------------------------------------------------------------------  Indications: Edema.  Limitations: Poor ultrasound/tissue interface. Comparison Study: No recent prior studies. Performing Technologist: Darlin Coco RDMS, RVT  Examination Guidelines: A complete evaluation includes B-mode imaging, spectral Doppler, color Doppler, and power Doppler as needed of all accessible portions of each vessel. Bilateral testing is considered an integral part of a complete examination. Limited examinations for reoccurring indications may be performed as noted. The reflux portion of the exam is performed with the patient in reverse Trendelenburg.  +---------+---------------+---------+-----------+----------+-------------------+ RIGHT    CompressibilityPhasicitySpontaneityPropertiesThrombus Aging      +---------+---------------+---------+-----------+----------+-------------------+ CFV      Full           Yes      Yes                                      +---------+---------------+---------+-----------+----------+-------------------+  SFJ      Full                                                             +---------+---------------+---------+-----------+----------+-------------------+ FV Prox  Full                                                             +---------+---------------+---------+-----------+----------+-------------------+ FV Mid   Full                                                             +---------+---------------+---------+-----------+----------+-------------------+ FV DistalFull                                                              +---------+---------------+---------+-----------+----------+-------------------+ PFV      Full                                                             +---------+---------------+---------+-----------+----------+-------------------+ POP      Full                                                             +---------+---------------+---------+-----------+----------+-------------------+ PTV                                                   Not well                                                                  visualized- unable                                                        to appreciate flow  with augment        +---------+---------------+---------+-----------+----------+-------------------+ PERO     Full                                         Not well visualized +---------+---------------+---------+-----------+----------+-------------------+   +---------+---------------+---------+-----------+----------+--------------+ LEFT     CompressibilityPhasicitySpontaneityPropertiesThrombus Aging +---------+---------------+---------+-----------+----------+--------------+ CFV      Full           Yes      Yes                                 +---------+---------------+---------+-----------+----------+--------------+ SFJ      Full                                                        +---------+---------------+---------+-----------+----------+--------------+ FV Prox  Full                                                        +---------+---------------+---------+-----------+----------+--------------+ FV Mid   Full                                                        +---------+---------------+---------+-----------+----------+--------------+ FV DistalFull                                                         +---------+---------------+---------+-----------+----------+--------------+ PFV      Full                                                        +---------+---------------+---------+-----------+----------+--------------+ POP      Full                                                        +---------+---------------+---------+-----------+----------+--------------+ PTV      Full                                                        +---------+---------------+---------+-----------+----------+--------------+ PERO     Full                                                        +---------+---------------+---------+-----------+----------+--------------+  Summary: RIGHT: - There is no evidence of deep vein thrombosis in the lower extremity. However, portions of this examination were limited- see technologist comments above.  - No cystic structure found in the popliteal fossa.  LEFT: - There is no evidence of deep vein thrombosis in the lower extremity.  - No cystic structure found in the popliteal fossa.  *See table(s) above for measurements and observations. Electronically signed by Deitra Mayo MD on 12/01/2020 at 4:51:17 PM.    Final      Results/Tests Pending at Time of Discharge: None  Discharge Medications:  Allergies as of 12/08/2020   No Known Allergies      Medication List     STOP taking these medications    bacitracin ointment   HYDROcodone-acetaminophen 5-325 MG tablet Commonly known as: Norco       TAKE these medications    acetaminophen 325 MG tablet Commonly known as: TYLENOL Take 2 tablets (650 mg total) by mouth every 6 (six) hours as needed.   aspirin 81 MG EC tablet Take 1 tablet (81 mg total) by mouth daily. Swallow whole. Start taking on: December 09, 2020   atorvastatin 40 MG tablet Commonly known as: LIPITOR Take 1 tablet (40 mg total) by mouth at bedtime.   barrier cream Crea Commonly known as: non-specified Apply 1  application topically 2 (two) times daily.   cyanocobalamin 1000 MCG tablet Take 1 tablet (1,000 mcg total) by mouth daily.   finasteride 5 MG tablet Commonly known as: PROSCAR Take 1 tablet (5 mg total) by mouth daily.   folic acid 1 MG tablet Commonly known as: FOLVITE Take 1 tablet (1 mg total) by mouth daily.   multivitamin with minerals Tabs tablet Take 1 tablet by mouth daily.   nicotine 21 mg/24hr patch Commonly known as: NICODERM CQ - dosed in mg/24 hours Place 1 patch (21 mg total) onto the skin daily.   penicillin g benzathine 1200000 UNIT/2ML Susy injection Commonly known as: BICILLIN LA Inject 4 mLs (2.4 Million Units total) into the muscle once a week for 1 dose. Due on 12/15/2020 Start taking on: December 15, 2020   polyethylene glycol 17 g packet Commonly known as: MIRALAX / GLYCOLAX Take 17 g by mouth daily as needed for mild constipation.   senna 8.6 MG Tabs tablet Commonly known as: SENOKOT Take 2 tablets (17.2 mg total) by mouth daily.   tamsulosin 0.4 MG Caps capsule Commonly known as: FLOMAX Take 1 capsule by mouth daily.   thiamine 100 MG tablet Take 1 tablet (100 mg total) by mouth daily.               Durable Medical Equipment  (From admission, onward)           Start     Ordered   12/08/20 1201  DME 3-in-1  Once        12/08/20 1202   12/08/20 1201  DME Walker  Once       Question Answer Comment  Walker: With [place]   Patient needs a walker to treat with the following condition Physical deconditioning      12/08/20 1202              Discharge Instructions: Please refer to Patient Instructions section of EMR for full details.  Patient was counseled important signs and symptoms that should prompt return to medical care, changes in medications, dietary instructions, activity restrictions, and follow up appointments.   Follow-Up Appointments:  Follow-up Information     Ihor Gully, MD. Call.   Specialty:  Urology Why: Please call to schedule an appointment for follow up. Contact information: [address] [place] [address] [phone]                 Princess Bruins, DO 12/08/2020, 1:39 PM PGY-1, [facility]  Upper Level Addendum: I have reviewed the above note, making necessary revisions as appropriate. I agree with the medical decision making and physical exam as noted above. Fayette Pho, MD PGY-2 [facility]

## 2020-12-05 NOTE — TOC Progression Note (Signed)
Transition of Care [facility]) - Progression Note    Patient Details  Name: Edgar Mckay MRN: [id number] Date of Birth: 1935/05/06  Transition of Care [facility]) CM/SW Contact  Verna Czech [place], [place] Phone Number: [phone] 12/05/2020, 1:20 PM  Clinical Narrative:    Phone call to patient's son to discuss facility choice for rehab. VM left requesting a return call.  Transition of Care to continue to follow   [facility], LCSW Transition of Care [phone]    Expected Discharge Plan: Skilled Nursing Facility Barriers to Discharge: Continued Medical Work up  Expected Discharge Plan and Services Expected Discharge Plan: Skilled Nursing Facility       Living arrangements for the past 2 months: Single Family Home                                       Social Determinants of Health (SDOH) Interventions    Readmission Risk Interventions No flowsheet data found.

## 2020-12-06 DIAGNOSIS — R54 Age-related physical debility: Secondary | ICD-10-CM | POA: Diagnosis not present

## 2020-12-06 DIAGNOSIS — R339 Retention of urine, unspecified: Secondary | ICD-10-CM | POA: Diagnosis not present

## 2020-12-06 DIAGNOSIS — Z659 Problem related to unspecified psychosocial circumstances: Secondary | ICD-10-CM | POA: Diagnosis not present

## 2020-12-06 DIAGNOSIS — R2681 Unsteadiness on feet: Secondary | ICD-10-CM | POA: Diagnosis not present

## 2020-12-06 NOTE — Progress Notes (Signed)
FPTS Brief Note Reviewed patient's vitals, recent notes.  Vitals:   12/06/20 0829 12/06/20 2022  BP: 123/63 (!) 131/55  Pulse: 79 74  Resp: 17 17  Temp: 98.4 F (36.9 C) 98.8 F (37.1 C)  SpO2: 97% 93%   Stable for d/c to SNF tomorrow. COVID test pending.  At this time, no change in plan from day progress note.  Sabino Dick, DO Page [phone] with questions about this patient.

## 2020-12-06 NOTE — Progress Notes (Signed)
Spoke with son, Jillyn Hidden, and he is on board with plan to send patient to SNF at [facility]. Requests update from inpatient service once his father has bed assigned.

## 2020-12-06 NOTE — Progress Notes (Signed)
FPTS Brief Note Reviewed patient's vitals, recent notes.  Vitals:   12/05/20 1601 12/05/20 2056  BP: 128/63 127/63  Pulse: 78 81  Resp: 16 19  Temp: 98.7 F (37.1 C) 99.1 F (37.3 C)  SpO2: 98% 93%   At this time, no change in plan from day progress note.  Fayette Pho, MD Page [phone] with questions about this patient.

## 2020-12-06 NOTE — TOC Progression Note (Signed)
Transition of Care [facility]) - Progression Note    Patient Details  Name: Edgar Mckay MRN: [id number] Date of Birth: 1935/06/02  Transition of Care [facility]) CM/SW Contact  Lorri Frederick, LCSW Phone Number: 12/06/2020, 1:10 PM  Clinical Narrative:   CSW spoke with Rosey Bath at [facility].  She can accept pt for admission tomorrow, 11/28.  Covid text requested from MD.  No auth needed.     Expected Discharge Plan: Skilled Nursing Facility Barriers to Discharge: Continued Medical Work up  Expected Discharge Plan and Services Expected Discharge Plan: Skilled Nursing Facility       Living arrangements for the past 2 months: Single Family Home                                       Social Determinants of Health (SDOH) Interventions    Readmission Risk Interventions No flowsheet data found.

## 2020-12-06 NOTE — Plan of Care (Signed)
  Problem: Nutrition: Goal: Adequate nutrition will be maintained Outcome: Progressing   Problem: Pain Managment: Goal: General experience of comfort will improve Outcome: Progressing   

## 2020-12-06 NOTE — Progress Notes (Signed)
Family Medicine Teaching Service Daily Progress Note Intern Pager: [phone]  Patient name: Edgar Mckay Medical record number: [id number] Date of birth: 09/16/1935 Age: 85 y.o. Gender: male  Primary Care Provider: Pcp, No Consultants: Urology  Code Status: DNR  Pt Overview and Major Events to Date:  11/21-admitted for obstructed foley catheter and concern for cellulitis and UTI. Foley replaced 11/22-urology consulted, started finasteride. APS accepted case 11/25: SNF bed offers available.  CSW unable to get in touch with sons to discuss.   Assessment and Plan: Edgar Mckay is a 85 y.o. male who was admitted for urinary retention and UTI secondary to a chronic and improperly positioned Foley.   Urinary Retention in Setting of BPH,improved  concern for UTI (ruled out) Urine cultures with multiple species. S/P treatment with Vanc, Zosyn, CTX and Keflex (ended on 11/26). Patient afebrile overnight.  Patient with indwelling foley catheter, of output in last 24 hours. Blood cultures negative for 5 days.  - continue finasteride 5mg   -continue flomax  -continue to monitor UOP   Constipation  -continue senna and miralax    Complex Social Situation -TOC working to help with placement and communication with family for assistance, patient currently awaiting SNF decision     FEN/GI: regular diet  PPx: lovenox   Dispo:SNF in next 1-2 days pending choice of facility by family. Barriers include SNF decision by family member.   Subjective:  Patient reports experiencing minimal pain  Objective: Temp:  [98.2 F (36.8 C)-99.1 F (37.3 C)] 98.2 F (36.8 C) (11/27 0318) Pulse Rate:  [67-81] 67 (11/27 0318) Resp:  [16-19] 18 (11/27 0318) BP: (108-128)/(58-63) 108/58 (11/27 0318) SpO2:  [93 %-99 %] 96 % (11/27 0318) Weight:  [92.1 kg] 92.1 kg (11/27 0343)  Physical Exam:  General: elderly male, sitting upright in hospital bed, NAD  Cardiovascular: RRR without murmurs   Respiratory: no wheezing, no signs of respiratory distress, stable on RA  Abdomen: nontender, bowel sounds present  GU: indwelling catheter, improved groin wound without drainage or bleeding   Laboratory: Recent Labs  Lab 11/30/20 1340 12/01/20 0800  WBC 8.8 6.9  HGB 13.9 13.1  HCT 43.2 40.6  PLT 219 184   Recent Labs  Lab 11/30/20 1340 12/01/20 0800  NA 143 141  K 3.6 3.5  CL 107 108  CO2 27 24  BUN 11 11  CREATININE 0.91 0.76  CALCIUM 9.0 9.1  PROT  --  5.8*  BILITOT  --  0.8  ALKPHOS  --  58  ALT  --  12  AST  --  18  GLUCOSE 113* 97    Imaging/Diagnostic Tests: No results found.   12/03/20, MD 12/06/2020, 6:27 AM PGY-3, [facility] FPTS Intern pager: [phone], text pages welcome

## 2020-12-07 ENCOUNTER — Inpatient Hospital Stay (HOSPITAL_COMMUNITY): Payer: Medicare Other

## 2020-12-07 DIAGNOSIS — G459 Transient cerebral ischemic attack, unspecified: Secondary | ICD-10-CM | POA: Diagnosis not present

## 2020-12-07 DIAGNOSIS — R339 Retention of urine, unspecified: Secondary | ICD-10-CM | POA: Diagnosis not present

## 2020-12-07 LAB — COMPREHENSIVE METABOLIC PANEL
ALT: 22 U/L (ref 0–44)
AST: 27 U/L (ref 15–41)
Albumin: 2.9 g/dL — ABNORMAL LOW (ref 3.5–5.0)
Alkaline Phosphatase: 48 U/L (ref 38–126)
Anion gap: 8 (ref 5–15)
BUN: 18 mg/dL (ref 8–23)
CO2: 27 mmol/L (ref 22–32)
Calcium: 9.1 mg/dL (ref 8.9–10.3)
Chloride: 100 mmol/L (ref 98–111)
Creatinine, Ser: 0.89 mg/dL (ref 0.61–1.24)
GFR, Estimated: >60 mL/min (ref >60)
Glucose, Bld: 108 mg/dL — ABNORMAL HIGH (ref 70–99)
Potassium: 4.4 mmol/L (ref 3.5–5.1)
Sodium: 135 mmol/L (ref 135–145)
Total Bilirubin: 0.6 mg/dL (ref 0.3–1.2)
Total Protein: 6.4 g/dL — ABNORMAL LOW (ref 6.5–8.1)

## 2020-12-07 LAB — CBC WITH DIFFERENTIAL/PLATELET
Abs Immature Granulocytes: 0.04 10*3/uL (ref 0.00–0.07)
Basophils Absolute: 0.1 10*3/uL (ref 0.0–0.1)
Basophils Relative: 1 %
Eosinophils Absolute: 0.2 10*3/uL (ref 0.0–0.5)
Eosinophils Relative: 2 %
HCT: 38.4 % — ABNORMAL LOW (ref 39.0–52.0)
Hemoglobin: 12.6 g/dL — ABNORMAL LOW (ref 13.0–17.0)
Immature Granulocytes: 1 %
Lymphocytes Relative: 20 %
Lymphs Abs: 1.5 10*3/uL (ref 0.7–4.0)
MCH: 28.6 pg (ref 26.0–34.0)
MCHC: 32.8 g/dL (ref 30.0–36.0)
MCV: 87.1 fL (ref 80.0–100.0)
Monocytes Absolute: 0.9 10*3/uL (ref 0.1–1.0)
Monocytes Relative: 12 %
Neutro Abs: 4.8 10*3/uL (ref 1.7–7.7)
Neutrophils Relative %: 64 %
Platelets: 209 10*3/uL (ref 150–400)
RBC: 4.41 MIL/uL (ref 4.22–5.81)
RDW: 14.6 % (ref 11.5–15.5)
WBC: 7.4 10*3/uL (ref 4.0–10.5)
nRBC: 0 % (ref 0.0–0.2)

## 2020-12-07 LAB — ECHOCARDIOGRAM COMPLETE
AR max vel: 2.63 cm2
AV Area VTI: 2.57 cm2
AV Area mean vel: 2.47 cm2
AV Mean grad: 4 mmHg
AV Peak grad: 8.1 mmHg
Ao pk vel: 1.42 m/s
Area-P 1/2: 1.76 cm2
Calc EF: 54.2 %
MV VTI: 2.63 cm2
S' Lateral: 3.3 cm
Single Plane A2C EF: 53.9 %
Single Plane A4C EF: 57.8 %
Weight: 3160.51 oz

## 2020-12-07 LAB — SARS CORONAVIRUS 2 (TAT 6-24 HRS): SARS Coronavirus 2: NEGATIVE

## 2020-12-07 LAB — GLUCOSE, CAPILLARY: Glucose-Capillary: 96 mg/dL (ref 70–99)

## 2020-12-07 MED ORDER — ATORVASTATIN CALCIUM 40 MG PO TABS: 40.0000 mg | ORAL_TABLET | Freq: Every day | ORAL | Status: DC

## 2020-12-07 MED ORDER — PENICILLIN G BENZATHINE 1200000 UNIT/2ML IM SUSY
2.4000 10*6.[IU] | PREFILLED_SYRINGE | INTRAMUSCULAR | Status: DC
  Administered 2020-12-08: 2.4 10*6.[IU] via INTRAMUSCULAR
  Filled 2020-12-07: qty 4

## 2020-12-07 MED ORDER — SENNA 8.6 MG PO TABS
2.0000 | ORAL_TABLET | Freq: Every day | ORAL | 0 refills | Status: DC
Start: 2020-12-07 — End: 2023-07-21

## 2020-12-07 MED ORDER — ATORVASTATIN CALCIUM 40 MG PO TABS
40.0000 mg | ORAL_TABLET | Freq: Every day | ORAL | Status: DC
  Administered 2020-12-07 – 2020-12-08 (×2): 40 mg via ORAL
  Filled 2020-12-07 (×2): qty 1

## 2020-12-07 MED ORDER — ASPIRIN EC 81 MG PO TBEC
81.0000 mg | DELAYED_RELEASE_TABLET | Freq: Every day | ORAL | Status: DC
  Administered 2020-12-07 – 2020-12-08 (×2): 81 mg via ORAL
  Filled 2020-12-07 (×2): qty 1

## 2020-12-07 MED ORDER — POLYETHYLENE GLYCOL 3350 17 G PO PACK: 17.0000 g | PACK | Freq: Two times a day (BID) | ORAL | 0 refills | Status: DC

## 2020-12-07 NOTE — Progress Notes (Signed)
*  PRELIMINARY RESULTS* Echocardiogram 2D Echocardiogram has been performed.  Carolyne Fiscal 12/07/2020, 2:13 PM
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
## 2020-12-07 NOTE — Progress Notes (Signed)
OT Cancellation Note  Patient Details Name: Edgar Mckay MRN: [id number] DOB: 11/26/1935   Cancelled Treatment:    Reason Eval/Treat Not Completed: Patient at procedure or test/ unavailable  Alfonzo Beers, OTD, OTR/L Acute Rehab [phone]) [phone]   Mayer Masker 12/07/2020, 10:49 AM

## 2020-12-07 NOTE — TOC Progression Note (Addendum)
Transition of Care [facility]) - Progression Note    Patient Details  Name: Edgar Mckay MRN: [id number] Date of Birth: 1935-08-10  Transition of Care [facility]) CM/SW Contact  Jimmy Picket, [place] Phone Number: 12/07/2020, 1:18 PM  Clinical Narrative:     Per MD pt is not medically ready. Pt could possibly DC tomorrow. [facility] admissions coordinator aware.   Expected Discharge Plan: Skilled Nursing Facility Barriers to Discharge: Continued Medical Work up  Expected Discharge Plan and Services Expected Discharge Plan: Skilled Nursing Facility       Living arrangements for the past 2 months: Single Family Home                                       Social Determinants of Health (SDOH) Interventions    Readmission Risk Interventions No flowsheet data found.  Jimmy Picket, LCSW Clinical Social Worker

## 2020-12-07 NOTE — Care Management Important Message (Signed)
Important Message  Patient Details  Name: Edgar Mckay MRN: [id number] Date of Birth: 05-30-1935   Medicare Important Message Given:  Yes     Wadie Lessen 12/07/2020, 2:48 PM

## 2020-12-08 ENCOUNTER — Encounter (HOSPITAL_COMMUNITY): Payer: Self-pay | Admitting: Student

## 2020-12-08 ENCOUNTER — Other Ambulatory Visit: Payer: Self-pay

## 2020-12-08 DIAGNOSIS — R339 Retention of urine, unspecified: Secondary | ICD-10-CM | POA: Diagnosis not present

## 2020-12-08 MED ORDER — PENICILLIN G BENZATHINE 1200000 UNIT/2ML IM SUSY: 2.4000 10*6.[IU] | PREFILLED_SYRINGE | INTRAMUSCULAR | Status: AC

## 2020-12-08 MED ORDER — ATORVASTATIN CALCIUM 40 MG PO TABS: 40.0000 mg | ORAL_TABLET | Freq: Every day | ORAL | Status: DC

## 2020-12-08 MED ORDER — ASPIRIN 81 MG PO TBEC: 81.0000 mg | DELAYED_RELEASE_TABLET | Freq: Every day | ORAL | 11 refills | Status: DC

## 2020-12-08 NOTE — TOC Transition Note (Signed)
Transition of Care [facility]) - CM/SW Discharge Note   Patient Details  Name: Edgar Mckay MRN: [id number] Date of Birth: 25-Jul-1935  Transition of Care [facility]) CM/SW Contact:  Jimmy Picket, LCSW Phone Number: 12/08/2020, 12:37 PM   Clinical Narrative:     Per MD patient ready for DC to  [facility]. RN, patient, and facility notified of DC. Discharge Summary and FL2 sent to facility. DC packet on chart. Insurance Berkley Harvey was not required and pt is covid negative. Ambulance transport requested for patient.    RN to call report to [phone].  CSW will sign off for now as social work intervention is no longer needed. Please consult [place] again if new needs arise.   Final next level of care: Skilled Nursing Facility Barriers to Discharge: Barriers Resolved   Patient Goals and CMS Choice Patient states their goals for this hospitalization and ongoing recovery are:: to get strong enough to go home CMS Medicare.gov Compare Post Acute Care list provided to:: Patient Choice offered to / list presented to : Patient  Discharge Placement              Patient chooses bed at: Other - please specify in the comment section below: ([facility]) Patient to be transferred to facility by: Ptar Name of family member notified: pt ox4 Patient and family notified of of transfer: 12/08/20  Discharge Plan and Services                                     Social Determinants of Health (SDOH) Interventions     Readmission Risk Interventions No flowsheet data found.   Jimmy Picket, LCSW Clinical Social Worker

## 2020-12-08 NOTE — TOC CAGE-AID Note (Signed)
Transition of Care [facility]) - CAGE-AID Screening   Patient Details  Name: Edgar Mckay MRN: [id number] Date of Birth: [birth year]  Transition of Care [facility]) CM/SW Contact:    Shirl Ludington C Tarpley-Carter, LCSWA Phone Number: 12/08/2020, 8:06 AM   Clinical Narrative: Pt is unable to participate in Cage Aid.  CSW will provide pt with resources for possible future use.  Hipolito Martinezlopez Tarpley-Carter, MSW, LCSW-A Pronouns:  She/Her/Hers [facility] Clinical Social Worker Direct Number:  [phone] [email or web address]  CAGE-AID Screening: Substance Abuse Screening unable to be completed due to: : Patient unable to participate             Substance Abuse Education Offered: No  Substance abuse interventions: Transport planner

## 2020-12-08 NOTE — Progress Notes (Signed)
Physical Therapy Treatment Patient Details Name: Edgar Mckay MRN: [id number] DOB: 1935-06-25 Today's Date: 12/08/2020   History of Present Illness Pt is an 85 y/o male addmited 11/21 secondary to obstructed urinary catheter and maceration of groin area. PMH includes retention with foley (02/2019), BPH, hx of paraphimosis s/p reduction.    PT Comments    Pt has had little opportunity to ambulate this admission, but has progressed well this session.  Emphasis on transitions, scooting, balance, pregait and progression of gait with RW in the room.    Recommendations for follow up therapy are one component of a multi-disciplinary discharge planning process, led by the attending physician.  Recommendations may be updated based on patient status, additional functional criteria and insurance authorization.  Follow Up Recommendations  Skilled nursing-short term rehab (<3 hours/day)     Assistance Recommended at Discharge Frequent or constant Supervision/Assistance  Equipment Recommendations  Rolling walker (2 wheels);BSC/3in1    Recommendations for Other Services       Precautions / Restrictions Precautions Precautions: Fall     Mobility  Bed Mobility Overal bed mobility: Needs Assistance Bed Mobility: Supine to Sit     Supine to sit: Mod assist;HOB elevated     General bed mobility comments: Initial LE assist, truncal assist to sitting then no assist during slow scoot to square up on EOB    Transfers Overall transfer level: Needs assistance Equipment used: Rolling walker (2 wheels);1 person hand held assist   Sit to Stand: Mod assist           General transfer comment: cues for hand placement, sit to stand with assist forward and boost.    Ambulation/Gait Ambulation/Gait assistance: Mod assist;Min assist (progressing to min assist level with confidence and decreased stiffness.) Gait Distance (Feet): 20 Feet Assistive device: Rolling walker (2 wheels) Gait  Pattern/deviations: Step-to pattern;Step-through pattern;Decreased step length - right;Decreased step length - left;Decreased stride length   Gait velocity interpretation: <1.31 ft/sec, indicative of household ambulator   General Gait Details: Initially needed moderate w/shift and stability assist with RW management, but with cuing and stability, pt started relaxing with less stiffness/co-contraction and started initiating his own w/shifts and stepping, progressing to minimal assist.   Stairs             Wheelchair Mobility    Modified Rankin (Stroke Patients Only)       Balance Overall balance assessment: Needs assistance Sitting-balance support: Feet supported Sitting balance-Leahy Scale: Fair     Standing balance support: Bilateral upper extremity supported;During functional activity Standing balance-Leahy Scale: Poor Standing balance comment: worked on w/shifts and steps in place with upright stance before progressing to gait.                            Cognition Arousal/Alertness: Awake/alert Behavior During Therapy: WFL for tasks assessed/performed Overall Cognitive Status: Within Functional Limits for tasks assessed                                          Exercises      General Comments        Pertinent Vitals/Pain Pain Assessment: Faces Faces Pain Scale: Hurts little more Pain Location: bil knees stiff and painful Pain Descriptors / Indicators: Grimacing;Guarding Pain Intervention(s): Monitored during session    Home Living  Prior Function            PT Goals (current goals can now be found in the care plan section) Acute Rehab PT Goals Patient Stated Goal: to feel better PT Goal Formulation: With patient Time For Goal Achievement: 12/15/20 Potential to Achieve Goals: Good Progress towards PT goals: Progressing toward goals    Frequency    Min 2X/week      PT Plan  Current plan remains appropriate    Co-evaluation              AM-PAC PT "6 Clicks" Mobility   Outcome Measure  Help needed turning from your back to your side while in a flat bed without using bedrails?: A Lot Help needed moving from lying on your back to sitting on the side of a flat bed without using bedrails?: A Lot Help needed moving to and from a bed to a chair (including a wheelchair)?: A Lot Help needed standing up from a chair using your arms (e.g., wheelchair or bedside chair)?: A Lot Help needed to walk in hospital room?: A Little Help needed climbing 3-5 steps with a railing? : Total 6 Click Score: 12    End of Session   Activity Tolerance: Patient limited by fatigue;Patient tolerated treatment well Patient left: in chair;with chair alarm set;with call bell/phone within reach Nurse Communication: Mobility status PT Visit Diagnosis: Muscle weakness (generalized) (M62.81);Unsteadiness on feet (R26.81)     Time: 1833-5825 PT Time Calculation (min) (ACUTE ONLY): 24 min  Charges:  $Gait Training: 8-22 mins $Therapeutic Activity: 8-22 mins                     12/08/2020  Jacinto Halim., PT Acute Rehabilitation Services [phone]  (pager) [phone]  (office)   Eliseo Gum Adeleigh Barletta 12/08/2020, 3:45 PM

## 2020-12-08 NOTE — Progress Notes (Signed)
FPTS Brief Progress Note  S: Patient sleeping comfortably in bed with TV on.  O: BP (!) 110/55 (BP Location: Right Arm)   Pulse 72   Temp 98.9 F (37.2 C) (Oral)   Resp 16   Wt 89.6 kg   SpO2 99%   BMI 29.17 kg/m   General: NAD, sleeping comfortably Neuro: No facial droop or asymmetry noted visually  A/P: Continue plan per day team.  Patient continues to be stable at this time, plan for discharge to SNF tomorrow. - Orders reviewed. Labs for AM ordered, which was adjusted as needed.   Shelby Mattocks, DO 12/08/2020, 2:46 AM PGY-1, [facility] Night Resident  Please page [phone] with questions.

## 2020-12-08 NOTE — Progress Notes (Signed)
Report given to Lawrence, RN

## 2020-12-08 NOTE — Progress Notes (Signed)
OT Cancellation Note  Patient Details Name: Edgar Mckay MRN: [id number] DOB: 04/08/1935   Cancelled Treatment:    Reason Eval/Treat Not Completed: Patient declined, no reason specified. Pt declining therapy, verbalizing "just leave me alone".  Alfonzo Beers, OTD, OTR/L Acute Rehab [phone]   Mayer Masker 12/08/2020, 3:18 PM
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
## 2020-12-09 NOTE — Progress Notes (Signed)
Pt discharged via stretcher. All belonging taken by patient. IV discontinued. Discharge instructions given to transport crew.

## 2020-12-09 NOTE — Plan of Care (Signed)
  Problem: Acute Rehab PT Goals(only PT should resolve) Goal: Pt Will Go Supine/Side To Sit Outcome: Adequate for Discharge Goal: Pt Will Go Sit To Supine/Side Outcome: Adequate for Discharge Goal: Patient Will Transfer Sit To/From Stand Outcome: Adequate for Discharge Goal: Pt Will Ambulate Outcome: Adequate for Discharge   Problem: Education: Goal: Knowledge of General Education information will improve Description: Including pain rating scale, medication(s)/side effects and non-pharmacologic comfort measures Outcome: Adequate for Discharge   Problem: Health Behavior/Discharge Planning: Goal: Ability to manage health-related needs will improve Outcome: Adequate for Discharge   Problem: Clinical Measurements: Goal: Ability to maintain clinical measurements within normal limits will improve Outcome: Adequate for Discharge Goal: Will remain free from infection Outcome: Adequate for Discharge Goal: Diagnostic test results will improve Outcome: Adequate for Discharge Goal: Respiratory complications will improve Outcome: Adequate for Discharge Goal: Cardiovascular complication will be avoided Outcome: Adequate for Discharge   Problem: Activity: Goal: Risk for activity intolerance will decrease Outcome: Adequate for Discharge   Problem: Nutrition: Goal: Adequate nutrition will be maintained Outcome: Adequate for Discharge   Problem: Coping: Goal: Level of anxiety will decrease Outcome: Adequate for Discharge   Problem: Elimination: Goal: Will not experience complications related to bowel motility Outcome: Adequate for Discharge Goal: Will not experience complications related to urinary retention Outcome: Adequate for Discharge   Problem: Pain Managment: Goal: General experience of comfort will improve Outcome: Adequate for Discharge   Problem: Safety: Goal: Ability to remain free from injury will improve Outcome: Adequate for Discharge   Problem: Skin  Integrity: Goal: Risk for impaired skin integrity will decrease Outcome: Adequate for Discharge   Problem: Acute Rehab OT Goals (only OT should resolve) Goal: Pt. Will Perform Upper Body Dressing Outcome: Adequate for Discharge Goal: Pt. Will Perform Lower Body Dressing Outcome: Adequate for Discharge Goal: Pt. Will Transfer To Toilet Outcome: Adequate for Discharge Goal: Pt. Will Perform Tub/Shower Transfer Outcome: Adequate for Discharge

## 2021-03-16 ENCOUNTER — Other Ambulatory Visit (HOSPITAL_COMMUNITY): Payer: Self-pay

## 2021-03-16 ENCOUNTER — Ambulatory Visit: Payer: Self-pay | Admitting: Registered Nurse

## 2021-03-16 MED ORDER — TAMSULOSIN HCL 0.4 MG PO CAPS
0.4000 mg | ORAL_CAPSULE | Freq: Every day | ORAL | 0 refills | Status: DC
  Filled 2021-03-16: qty 14, 14d supply, fill #0

## 2021-03-16 MED ORDER — SERTRALINE HCL 25 MG PO TABS
25.0000 mg | ORAL_TABLET | Freq: Every morning | ORAL | 0 refills | Status: DC
  Filled 2021-03-16: qty 14, 14d supply, fill #0

## 2021-03-16 MED ORDER — THIAMINE HCL 100 MG PO TABS
100.0000 mg | ORAL_TABLET | Freq: Every day | ORAL | 0 refills | Status: DC
Start: 2021-03-10 — End: 2021-03-25
  Filled 2021-03-16: qty 14, 14d supply, fill #0

## 2021-03-16 MED ORDER — CYANOCOBALAMIN 1000 MCG PO TABS
1000.0000 ug | ORAL_TABLET | Freq: Every day | ORAL | 0 refills | Status: DC
Start: 2021-03-10 — End: 2023-08-26

## 2021-03-16 MED ORDER — MULTIPLE VIT/MINERALS/NO IRON PO TABS
1.0000 | ORAL_TABLET | Freq: Every day | ORAL | 0 refills | Status: DC
Start: 2021-03-10 — End: 2021-03-25

## 2021-03-16 MED ORDER — ASPIRIN 81 MG PO CHEW
81.0000 mg | CHEWABLE_TABLET | Freq: Every day | ORAL | 0 refills | Status: DC
  Filled 2021-03-16: qty 14, 14d supply, fill #0

## 2021-03-16 MED ORDER — FOLIC ACID 1 MG PO TABS
1.0000 mg | ORAL_TABLET | Freq: Every day | ORAL | 0 refills | Status: DC
  Filled 2021-03-16: qty 14, 14d supply, fill #0

## 2021-03-16 MED ORDER — DOCUSATE SODIUM 100 MG PO CAPS: 100.0000 mg | ORAL_CAPSULE | Freq: Every day | ORAL | 0 refills | Status: DC

## 2021-03-16 MED ORDER — FINASTERIDE 5 MG PO TABS
5.0000 mg | ORAL_TABLET | Freq: Every day | ORAL | 0 refills | Status: DC
  Filled 2021-03-16: qty 14, 14d supply, fill #0

## 2021-03-16 MED ORDER — ATORVASTATIN CALCIUM 40 MG PO TABS
40.0000 mg | ORAL_TABLET | Freq: Every day | ORAL | 0 refills | Status: DC
  Filled 2021-03-16: qty 14, 14d supply, fill #0

## 2021-03-16 MED ORDER — GABAPENTIN 100 MG PO CAPS
100.0000 mg | ORAL_CAPSULE | Freq: Three times a day (TID) | ORAL | 0 refills | Status: DC
  Filled 2021-03-16: qty 42, 14d supply, fill #0

## 2021-03-17 ENCOUNTER — Telehealth: Payer: Self-pay

## 2021-03-17 ENCOUNTER — Other Ambulatory Visit (HOSPITAL_COMMUNITY): Payer: Self-pay

## 2021-03-17 MED ORDER — POLYETHYLENE GLYCOL 3350 17 G PO PACK
17.0000 g | PACK | Freq: Every day | ORAL | 0 refills | Status: DC
  Filled 2021-03-17: qty 14, 14d supply, fill #0

## 2021-03-17 NOTE — Telephone Encounter (Signed)
Caller name:Stephanie at [facility]  ? ?On DPR? :No ? ?Call back number:[phone] ? ?Provider they see: Gerlene Burdock   ? ?Reason for call:Pt does not establish care until 03/16  ?Nursing 2 week 1 ?               1 week 4 ? ?Pt has full catheter but she doe not have on document when it was put in he come from a nursing facility  ? ?

## 2021-03-18 NOTE — Telephone Encounter (Signed)
They are sending a fax over now  ?

## 2021-03-18 NOTE — Telephone Encounter (Signed)
I would encourage him to get this set through nursing facility or at least have them / the home medical supply company fax reorders to [place] here ? ?Thanks, ? ?Rich

## 2021-03-18 NOTE — Telephone Encounter (Signed)
Can we supply these orders before they are established with you as their provider? ? ?

## 2021-03-19 ENCOUNTER — Telehealth: Payer: Self-pay

## 2021-03-19 ENCOUNTER — Emergency Department (HOSPITAL_COMMUNITY)
Admission: EM | Admit: 2021-03-19 | Discharge: 2021-03-19 | Disposition: A | Discharge status: Home / Self Care | Payer: Medicare Other | Attending: Emergency Medicine | Admitting: Emergency Medicine

## 2021-03-19 ENCOUNTER — Other Ambulatory Visit: Payer: Self-pay

## 2021-03-19 ENCOUNTER — Encounter (HOSPITAL_COMMUNITY): Payer: Self-pay | Admitting: Emergency Medicine

## 2021-03-19 DIAGNOSIS — Y730 Diagnostic and monitoring gastroenterology and urology devices associated with adverse incidents: Secondary | ICD-10-CM | POA: Diagnosis not present

## 2021-03-19 DIAGNOSIS — T839XXA Unspecified complication of genitourinary prosthetic device, implant and graft, initial encounter: Secondary | ICD-10-CM

## 2021-03-19 DIAGNOSIS — T83031A Leakage of indwelling urethral catheter, initial encounter: Secondary | ICD-10-CM | POA: Insufficient documentation

## 2021-03-19 DIAGNOSIS — Z7982 Long term (current) use of aspirin: Secondary | ICD-10-CM | POA: Insufficient documentation

## 2021-03-19 LAB — URINALYSIS, ROUTINE W REFLEX MICROSCOPIC
Bilirubin Urine: NEGATIVE
Glucose, UA: NEGATIVE mg/dL
Hgb urine dipstick: NEGATIVE
Ketones, ur: NEGATIVE mg/dL
Nitrite: NEGATIVE
Protein, ur: NEGATIVE mg/dL
Specific Gravity, Urine: 1.018 (ref 1.005–1.030)
WBC, UA: >50 WBC/hpf — ABNORMAL HIGH (ref 0–5)
pH: 6 (ref 5.0–8.0)

## 2021-03-19 NOTE — ED Provider Notes (Signed)
?[facility] EMERGENCY DEPARTMENT ?Provider Note ? ? ?CSN: [id number] ?Arrival date & time: 03/19/21  1152 ? ?  ? ?History ? ?Chief Complaint  ?Patient presents with  ? foley problem  ? ? ?Edgar Mckay is a 86 y.o. male. ? ?HPI ?Patient with a decades long history of intermittent Foley catheterizations now presents with leakage around his Foley catheter site.  No abdominal pain, nausea, vomiting, fever, chills.  This catheter has been in place for several weeks. ?  ? ?Home Medications ?Prior to Admission medications   ?Medication Sig Start Date End Date Taking? Authorizing Provider  ?acetaminophen (TYLENOL) 325 MG tablet Take 2 tablets (650 mg total) by mouth every 6 (six) hours as needed. 12/05/20   Sabino Dick, DO  ?aspirin 81 MG chewable tablet Chew 1 tablet (81 mg total) by mouth daily. 03/10/21   Bonnita Nasuti, AGNP-C  ?aspirin EC 81 MG EC tablet Take 1 tablet (81 mg total) by mouth daily. Swallow whole. 12/09/20   Fayette Pho, MD  ?atorvastatin (LIPITOR) 40 MG tablet Take 1 tablet (40 mg total) by mouth at bedtime. 12/08/20   Fayette Pho, MD  ?atorvastatin (LIPITOR) 40 MG tablet Take 1 tablet (40 mg total) by mouth at bedtime for cholesterol. 03/10/21   Bonnita Nasuti, AGNP-C  ?barrier cream (NON-SPECIFIED) CREA Apply 1 application topically 2 (two) times daily. 12/05/20   Sabino Dick, DO  ?cyanocobalamin 1000 MCG tablet Take 1 tablet (1,000 mcg total) by mouth daily for Vitamin Supplement. 03/10/21   Bonnita Nasuti, AGNP-C  ?docusate sodium (COLACE) 100 MG capsule Take 1 capsule (100 mg total) by mouth daily for constipation. 03/10/21   Bonnita Nasuti, AGNP-C  ?finasteride (PROSCAR) 5 MG tablet Take 1 tablet (5 mg total) by mouth daily. 12/05/20   Sabino Dick, DO  ?finasteride (PROSCAR) 5 MG tablet Take 1 tablet (5 mg total) by mouth daily for benign prostatic hyperplasia. 03/10/21   Bonnita Nasuti, AGNP-C  ?folic acid (FOLVITE) 1 MG tablet Take 1 tablet (1 mg  total) by mouth daily. 12/05/20   Sabino Dick, DO  ?folic acid (FOLVITE) 1 MG tablet Take 1 tablet (1 mg total) by mouth daily for supplement. 03/10/21   Bonnita Nasuti, AGNP-C  ?gabapentin (NEURONTIN) 100 MG capsule Take 1 capsule (100 mg total) by mouth 3 (three) times daily for neuropathy. 03/10/21   Bonnita Nasuti, AGNP-C  ?Multiple Vitamin (MULTIVITAMIN WITH MINERALS) TABS tablet Take 1 tablet by mouth daily. 12/05/20   Sabino Dick, DO  ?Multiple Vitamins-Minerals (MULTIPLE VIT/MINERALS/NO IRON) TABS Take 1 tablet by mouth daily. 03/10/21   Bonnita Nasuti, AGNP-C  ?nicotine (NICODERM CQ - DOSED IN MG/24 HOURS) 21 mg/24hr patch Place 1 patch (21 mg total) onto the skin daily. 12/05/20   Sabino Dick, DO  ?polyethylene glycol (MIRALAX / GLYCOLAX) 17 g packet Take 17 g by mouth daily as needed for mild constipation. 12/05/20   Sabino Dick, DO  ?polyethylene glycol (MIRALAX / GLYCOLAX) 17 g packet Take 17 g by mouth daily for constipation 03/10/21   Bonnita Nasuti, AGNP-C  ?senna (SENOKOT) 8.6 MG TABS tablet Take 2 tablets (17.2 mg total) by mouth daily. 12/07/20   Princess Bruins, DO  ?sertraline (ZOLOFT) 25 MG tablet Take 1 tablet (25 mg total) by mouth in the morning for depression. 03/10/21   Bonnita Nasuti, AGNP-C  ?tamsulosin (FLOMAX) 0.4 MG CAPS capsule Take 1 capsule by mouth daily. 12/05/20   Sabino Dick, DO  ?tamsulosin (FLOMAX) 0.4 MG CAPS capsule Take 1 capsule (0.4 mg  total) by mouth daily. 03/10/21   Bonnita Nasuti, AGNP-C  ?thiamine 100 MG tablet Take 1 tablet (100 mg total) by mouth daily. 12/05/20   Sabino Dick, DO  ?thiamine 100 MG tablet Take 1 tablet (100 mg total) by mouth daily for vitamin supplement. 03/10/21   Bonnita Nasuti, AGNP-C  ?vitamin B-12 1000 MCG tablet Take 1 tablet (1,000 mcg total) by mouth daily. 12/05/20   Sabino Dick, DO  ?   ? ?Allergies    ?Patient has no known allergies.   ? ?Review of Systems   ?Review of Systems   ?Constitutional:   ?     Per HPI, otherwise negative  ?HENT:    ?     Per HPI, otherwise negative  ?Respiratory:    ?     Per HPI, otherwise negative  ?Cardiovascular:   ?     Per HPI, otherwise negative  ?Gastrointestinal:  Negative for vomiting.  ?Endocrine:  ?     Negative aside from HPI  ?Genitourinary:   ?     Neg aside from HPI   ?Musculoskeletal:   ?     Per HPI, otherwise negative  ?Skin: Negative.   ?Neurological:  Negative for syncope.  ? ?Physical Exam ?Updated Vital Signs ?BP (!) 143/77   Pulse 65   Temp 98.6 ?F (37 ?C) (Oral)   Resp 16   SpO2 100%  ?Physical Exam ?Vitals and nursing note reviewed.  ?Constitutional:   ?   General: He is not in acute distress. ?   Appearance: He is well-developed.  ?HENT:  ?   Head: Normocephalic and atraumatic.  ?Eyes:  ?   Conjunctiva/sclera: Conjunctivae normal.  ?Cardiovascular:  ?   Rate and Rhythm: Normal rate and regular rhythm.  ?Pulmonary:  ?   Effort: Pulmonary effort is normal. No respiratory distress.  ?   Breath sounds: No stridor.  ?Abdominal:  ?   General: There is no distension.  ?   Tenderness: There is no abdominal tenderness. There is no guarding.  ?Genitourinary: ?   Comments: No obvious hypospadia, uncircumcised, no obvious deformity, urine with sediment draining into catheter bag ?Skin: ?   General: Skin is warm and dry.  ?Neurological:  ?   Mental Status: He is alert and oriented to person, place, and time.  ? ? ?ED Results / Procedures / Treatments   ?Labs ?(all labs ordered are listed, but only abnormal results are displayed) ?Labs Reviewed  ?URINALYSIS, ROUTINE W REFLEX MICROSCOPIC - Abnormal; Notable for the following components:  ?    Result Value  ? APPearance HAZY (*)   ? Leukocytes,Ua LARGE (*)   ? WBC, UA >50 (*)   ? Bacteria, UA MANY (*)   ? All other components within normal limits  ? ? ? ? ?Procedures ?Procedures  ? ? ?Medications Ordered in ED ?Medications - No data to display ? ?ED Course/ Medical Decision Making/ A&P ?Adult male  presents with dysfunctional Foley catheter, no other complaints is awake, alert, afebrile, with no hypotensive.  Patient had her placement of Foley catheter.  On repeat exam the patient is awake and alert, notes that he was recently prescribed antibiotics, but only started them last night.  Urinalysis somewhat concerning for infection, but no evidence for bacteremia, sepsis, patient will continue taking his antibiotics, follow-up with his outpatient physician. ? ?Determinants of care: Age ?Additional details per chart review, hospitalization for obstructive catheter last year ?Comorbidities: Chronic intermittent indwelling Foley catheter, hypertension, age ?                        ? ?  Final Clinical Impression(s) / ED Diagnoses ?Final diagnoses:  ?Problem with Foley catheter, initial encounter (HCC)  ? ?  ?Gerhard MunchLockwood, Analissa Bayless, MD ?03/19/21 1519 ? ?

## 2021-03-19 NOTE — ED Notes (Signed)
Attempt number 2 with different number for pts son to transport pt. No answer, unable to leave message  ?

## 2021-03-19 NOTE — ED Notes (Signed)
RN was able to reach pts son [name]. Pt will be awaiting ride in [place].  ?

## 2021-03-19 NOTE — Telephone Encounter (Signed)
I have not received any paperwork on this patient at this time  ?

## 2021-03-19 NOTE — ED Notes (Signed)
RN called number on file for pts son, Edgar Mckay, message left/no answer.  ?

## 2021-03-19 NOTE — Discharge Instructions (Signed)
As discussed, your evaluation today has been largely reassuring.  But, it is important that you monitor your condition carefully, and do not hesitate to return to the ED if you develop new, or concerning changes in your condition. ? ?Otherwise, please follow-up with your physician for appropriate ongoing care.  Please be sure to continue taking the medication started yesterday. ? ?

## 2021-03-19 NOTE — Telephone Encounter (Signed)
[id number] ?(Primary), ?[id number] ?(Secondary) ?Address: ?City/ ?State/ ?Zip: [place] ? [address] ?[facility] - ?Cli ?Client Site [facility] ?Provider Janeece Agee- NP ?Contact Type Call ?Who Is Calling Patient / Member / Family / Caregiver ?Call Type Triage / Clinical ?Relationship To Patient Self ?Return Phone Number [phone] (Secondary) ?Chief Complaint Urinary Catheter Problems ?Reason for Call Symptomatic / Request for Health Information ?Initial Comment Call originated from the office, Zollie Beckers stated the ?patient had hung up when he was connecting him ?here. Caller stated that the patient had a catheter ?put in and currently it is leaking. ?Translation No ?No Triage Reason Patient declined ?Nurse Assessment ?Nurse: Henri Medal, RN, Amy Date/Time Lamount Cohen Time): 03/19/2021 11:29:59 AM ?Confirm and document reason for call. If ?symptomatic, describe symptoms. ?---Caller states that he had a catheter put in, and it ?is currently leaking. He is going to the ER to get it ?replaced. ?Does the patient have any new or worsening ?symptoms? ---Yes ?Will a triage be completed? ---No ?Select reason for no triage. ---Patient declined ?Disp. Time (Eastern ?Time) Disposition Final User ?03/19/2021 11:32:05 AM Attempt made - no message left Lovelace, RN, Amy ?03/19/2021 11:35:22 AM Clinical Call Yes Lovelace, RN, Am ?

## 2021-03-19 NOTE — ED Triage Notes (Signed)
C/o foley leaking. A&Ox4 ?

## 2021-03-19 NOTE — Telephone Encounter (Signed)
Please review and advise.

## 2021-03-19 NOTE — ED Notes (Signed)
Pt dc'd home with foley cath in place with leg bag and dc paperwork  ?

## 2021-03-22 NOTE — Telephone Encounter (Signed)
Recommend triage. Since pt declined, will see him at scheduled date with me.  ? ?Thanks, ? ?Rich

## 2021-03-25 ENCOUNTER — Encounter: Payer: Self-pay | Admitting: Registered Nurse

## 2021-03-25 ENCOUNTER — Ambulatory Visit (INDEPENDENT_AMBULATORY_CARE_PROVIDER_SITE_OTHER): Payer: Medicare Other | Admitting: Registered Nurse

## 2021-03-25 VITALS — BP 136/78 | HR 64 | Temp 98.2°F | Resp 17 | Ht 69.0 in | Wt 218.2 lb

## 2021-03-25 DIAGNOSIS — Z8639 Personal history of other endocrine, nutritional and metabolic disease: Secondary | ICD-10-CM

## 2021-03-25 DIAGNOSIS — N3001 Acute cystitis with hematuria: Secondary | ICD-10-CM

## 2021-03-25 DIAGNOSIS — G459 Transient cerebral ischemic attack, unspecified: Secondary | ICD-10-CM

## 2021-03-25 DIAGNOSIS — T839XXA Unspecified complication of genitourinary prosthetic device, implant and graft, initial encounter: Secondary | ICD-10-CM | POA: Diagnosis not present

## 2021-03-25 NOTE — Progress Notes (Signed)
Established Patient Office Visit  Subjective:  Patient ID: Edgar Mckay, male    DOB: 05/28/1935  Age: 86 y.o. MRN: [id number]  CC:  Chief Complaint  Patient presents with   Establish Care    Pt has not had PCP in several years, notes no concerns or questions     HPI Edgar Mckay presents for visit to est care  Here with son, [name]   Has not had a primary care provider in a number of years. He has a number of ongoing issues that have been addressed only in acute settings. He uses a Foley cath for urine retention of unknown etiology. He has had multiple UTIs due to this. He did have a TIA in November - noted when he presented to the ER for cellulitis. Has not followed up with outpatient neuro. He did go to inpatient rehab after that visit and now has home nursing visiting him   Currently staying with his son, [name]. - [phone]  He is deficient on a number of maintenance items that we will continue to address in future visits.   Past Medical History:  Diagnosis Date   Urinary retention     Past Surgical History:  Procedure Laterality Date   PROSTATE BIOPSY  1990    Family History  Family history unknown: Yes    Social History   Socioeconomic History   Marital status: Legally Separated    Spouse name: Not on file   Number of children: Not on file   Years of education: Not on file   Highest education level: Not on file  Occupational History   Not on file  Tobacco Use   Smoking status: Every Day    Packs/day: 1.00    Years: 60.00    Pack years: 60.00    Types: Cigarettes   Smokeless tobacco: Never  Vaping Use   Vaping Use: Never used  Substance and Sexual Activity   Alcohol use: Yes    Comment: has not had a drink in 4-5 months   Drug use: No   Sexual activity: Not Currently  Other Topics Concern   Not on file  Social History Narrative   Not on file   Social Determinants of Health   Financial Resource Strain: Not on file  Food  Insecurity: Not on file  Transportation Needs: Not on file  Physical Activity: Not on file  Stress: Not on file  Social Connections: Not on file  Intimate Partner Violence: Not on file    Outpatient Medications Prior to Visit  Medication Sig Dispense Refill   acetaminophen (TYLENOL) 325 MG tablet Take 2 tablets (650 mg total) by mouth every 6 (six) hours as needed.     aspirin 81 MG chewable tablet Chew 1 tablet (81 mg total) by mouth daily. 14 tablet 0   atorvastatin (LIPITOR) 40 MG tablet Take 1 tablet (40 mg total) by mouth at bedtime.     cyanocobalamin 1000 MCG tablet Take 1 tablet (1,000 mcg total) by mouth daily for Vitamin Supplement. 14 tablet 0   docusate sodium (COLACE) 100 MG capsule Take 1 capsule (100 mg total) by mouth daily for constipation. 14 capsule 0   finasteride (PROSCAR) 5 MG tablet Take 1 tablet (5 mg total) by mouth daily.     folic acid (FOLVITE) 1 MG tablet Take 1 tablet (1 mg total) by mouth daily for supplement. 14 tablet 0   gabapentin (NEURONTIN) 100 MG capsule Take 1 capsule (100 mg total) by  mouth 3 (three) times daily for neuropathy. 42 capsule 0   Multiple Vitamin (MULTIVITAMIN WITH MINERALS) TABS tablet Take 1 tablet by mouth daily.     nicotine (NICODERM CQ - DOSED IN MG/24 HOURS) 21 mg/24hr patch Place 1 patch (21 mg total) onto the skin daily. 28 patch 0   polyethylene glycol (MIRALAX / GLYCOLAX) 17 g packet Take 17 g by mouth daily as needed for mild constipation. 14 each 0   senna (SENOKOT) 8.6 MG TABS tablet Take 2 tablets (17.2 mg total) by mouth daily. 120 tablet 0   sertraline (ZOLOFT) 25 MG tablet Take 1 tablet (25 mg total) by mouth in the morning for depression. 14 tablet 0   tamsulosin (FLOMAX) 0.4 MG CAPS capsule Take 1 capsule (0.4 mg total) by mouth daily. 14 capsule 0   thiamine 100 MG tablet Take 1 tablet (100 mg total) by mouth daily.     aspirin EC 81 MG EC tablet Take 1 tablet (81 mg total) by mouth daily. Swallow  whole. 30 tablet 11   atorvastatin (LIPITOR) 40 MG tablet Take 1 tablet (40 mg total) by mouth at bedtime for cholesterol. 14 tablet 0   barrier cream (NON-SPECIFIED) CREA Apply 1 application topically 2 (two) times daily. (Patient not taking: Reported on 03/25/2021)     finasteride (PROSCAR) 5 MG tablet Take 1 tablet (5 mg total) by mouth daily for benign prostatic hyperplasia. 14 tablet 0   folic acid (FOLVITE) 1 MG tablet Take 1 tablet (1 mg total) by mouth daily.     Multiple Vitamins-Minerals (MULTIPLE VIT/MINERALS/NO IRON) TABS Take 1 tablet by mouth daily. 14 tablet 0   polyethylene glycol (MIRALAX / GLYCOLAX) 17 g packet Take 17 g by mouth daily for constipation 14 each 0   tamsulosin (FLOMAX) 0.4 MG CAPS capsule Take 1 capsule by mouth daily. 30 capsule 0   thiamine 100 MG tablet Take 1 tablet (100 mg total) by mouth daily for vitamin supplement. 14 tablet 0   vitamin B-12 1000 MCG tablet Take 1 tablet (1,000 mcg total) by mouth daily.     No facility-administered medications prior to visit.    No Known Allergies  ROS Review of Systems  Constitutional: Negative.   HENT: Negative.    Eyes: Negative.   Respiratory: Negative.    Cardiovascular: Negative.   Gastrointestinal: Negative.   Genitourinary: Negative.   Musculoskeletal: Negative.   Skin: Negative.   Neurological: Negative.   Psychiatric/Behavioral: Negative.    All other systems reviewed and are negative.    Objective:    Physical Exam Constitutional:      General: He is not in acute distress.    Appearance: Normal appearance. He is normal weight. He is not ill-appearing, toxic-appearing or diaphoretic.  Cardiovascular:     Rate and Rhythm: Normal rate and regular rhythm.     Heart sounds: Normal heart sounds. No murmur heard.   No friction rub. No gallop.  Pulmonary:     Effort: Pulmonary effort is normal. No respiratory distress.     Breath sounds: Normal breath sounds. No stridor. No wheezing,  rhonchi or rales.  Chest:     Chest wall: No tenderness.  Neurological:     General: No focal deficit present.     Mental Status: He is alert and oriented to person, place, and time. Mental status is at baseline.  Psychiatric:        Mood and Affect: Mood normal.  Behavior: Behavior normal.        Thought Content: Thought content normal.        Judgment: Judgment normal.    BP 136/78   Pulse 64   Temp 98.2 F (36.8 C) (Temporal)   Resp 17   Ht 5\' 9"  (1.753 m)   Wt 218 lb 3.2 oz (99 kg)   SpO2 98%   BMI 32.22 kg/m  Wt Readings from Last 3 Encounters:  03/25/21 218 lb 3.2 oz (99 kg)  12/07/20 197 lb 8.5 oz (89.6 kg)  10/16/20 209 lb (94.8 kg)     Health Maintenance Due  Topic Date Due   COVID-19 Vaccine (1) Never done   Pneumonia Vaccine 44+ Years old (1 - PCV) Never done   TETANUS/TDAP  Never done   Zoster Vaccines- Shingrix (1 of 2) Never done   INFLUENZA VACCINE  Never done    There are no preventive care reminders to display for this patient.  Lab Results  Component Value Date   TSH 3.043 12/01/2020   Lab Results  Component Value Date   WBC 8.0 03/25/2021   HGB 12.5 (L) 03/25/2021   HCT 38.0 (L) 03/25/2021   MCV 84.2 03/25/2021   PLT 150.0 03/25/2021   Lab Results  Component Value Date   NA 146 (H) 03/25/2021   K 3.5 03/25/2021   CO2 30 03/25/2021   GLUCOSE 89 03/25/2021   BUN 13 03/25/2021   CREATININE 0.77 03/25/2021   BILITOT 0.4 03/25/2021   ALKPHOS 77 03/25/2021   AST 25 03/25/2021   ALT 21 03/25/2021   PROT 6.8 03/25/2021   ALBUMIN 4.2 03/25/2021   CALCIUM 9.4 03/25/2021   ANIONGAP 8 12/07/2020   GFR 82.69 03/25/2021   Lab Results  Component Value Date   CHOL 138 03/25/2021   Lab Results  Component Value Date   HDL 48.70 03/25/2021   Lab Results  Component Value Date   LDLCALC 53 03/25/2021   Lab Results  Component Value Date   TRIG 183.0 (H) 03/25/2021   Lab Results  Component Value Date   CHOLHDL 3  03/25/2021   Lab Results  Component Value Date   HGBA1C 6.3 03/25/2021      Assessment & Plan:   Problem List Items Addressed This Visit       Cardiovascular and Mediastinum   TIA (transient ischemic attack)   Relevant Orders   Ambulatory referral to Neurology   CBC with Differential/Platelet (Completed)   Comprehensive metabolic panel (Completed)   Hemoglobin A1c (Completed)   Lipid panel (Completed)   Urinalysis, Routine w reflex microscopic (Completed)   Urine Culture (Completed)     Genitourinary   Acute cystitis with hematuria   Relevant Orders   Urine Culture (Completed)   Other Visit Diagnoses     Problem with Foley catheter, subsequent encounter    -  Primary   Relevant Orders   Ambulatory referral to Urology   CBC with Differential/Platelet (Completed)   Comprehensive metabolic panel (Completed)   Hemoglobin A1c (Completed)   Lipid panel (Completed)   Urinalysis, Routine w reflex microscopic (Completed)   Urine Culture (Completed)   History of elevated glucose       Relevant Orders   Hemoglobin A1c (Completed)       No orders of the defined types were placed in this encounter.   Follow-up: Return in about 4 weeks (around 04/22/2021) for follow up - cath, TIA.   PLAN Refer to urology and neurology  as above Labs collected. Will follow up with the patient as warranted. Return in 4 weeks for recheck labs, assess status, maintenance. Patient encouraged to call clinic with any questions, comments, or concerns.  Janeece Agee, NP

## 2021-03-25 NOTE — Patient Instructions (Addendum)
Mr. Edgar Mckay -  ? ?A pleasure to meet you and [name].  ? ?I will get you set up with a urologist to manage the catheter and a neurologist to check in on the TIA (mini-stroke). These offices will call you to set up appointments ? ?I want to get labs today. I will call if results are worrisome. ? ?I want to see you again in 1 month or so. ? ?Thank you ? ?[name]  ?

## 2021-03-26 ENCOUNTER — Telehealth: Payer: Self-pay | Admitting: Registered Nurse

## 2021-03-26 LAB — LIPID PANEL
Cholesterol: 138 mg/dL (ref 0–200)
HDL: 48.7 mg/dL (ref >39.00)
LDL Cholesterol: 53 mg/dL (ref 0–99)
NonHDL: 89.63
Total CHOL/HDL Ratio: 3
Triglycerides: 183 mg/dL — ABNORMAL HIGH (ref 0.0–149.0)
VLDL: 36.6 mg/dL (ref 0.0–40.0)

## 2021-03-26 LAB — URINALYSIS, ROUTINE W REFLEX MICROSCOPIC
Bilirubin Urine: NEGATIVE
Hgb urine dipstick: NEGATIVE
Ketones, ur: NEGATIVE
Nitrite: NEGATIVE
RBC / HPF: NONE SEEN (ref 0–?)
Specific Gravity, Urine: <=1.005 — AB (ref 1.000–1.030)
Total Protein, Urine: 100 — AB
Urine Glucose: NEGATIVE
Urobilinogen, UA: 1 (ref 0.0–1.0)
pH: >=9 — AB (ref 5.0–8.0)

## 2021-03-26 LAB — COMPREHENSIVE METABOLIC PANEL
ALT: 21 U/L (ref 0–53)
AST: 25 U/L (ref 0–37)
Albumin: 4.2 g/dL (ref 3.5–5.2)
Alkaline Phosphatase: 77 U/L (ref 39–117)
BUN: 13 mg/dL (ref 6–23)
CO2: 30 mEq/L (ref 19–32)
Calcium: 9.4 mg/dL (ref 8.4–10.5)
Chloride: 107 mEq/L (ref 96–112)
Creatinine, Ser: 0.77 mg/dL (ref 0.40–1.50)
GFR: 82.69 mL/min (ref >60.00)
Glucose, Bld: 89 mg/dL (ref 70–99)
Potassium: 3.5 mEq/L (ref 3.5–5.1)
Sodium: 146 mEq/L — ABNORMAL HIGH (ref 135–145)
Total Bilirubin: 0.4 mg/dL (ref 0.2–1.2)
Total Protein: 6.8 g/dL (ref 6.0–8.3)

## 2021-03-26 LAB — CBC WITH DIFFERENTIAL/PLATELET
Basophils Absolute: 0.1 10*3/uL (ref 0.0–0.1)
Basophils Relative: 0.7 % (ref 0.0–3.0)
Eosinophils Absolute: 0.3 10*3/uL (ref 0.0–0.7)
Eosinophils Relative: 3.4 % (ref 0.0–5.0)
HCT: 38 % — ABNORMAL LOW (ref 39.0–52.0)
Hemoglobin: 12.5 g/dL — ABNORMAL LOW (ref 13.0–17.0)
Lymphocytes Relative: 34.9 % (ref 12.0–46.0)
Lymphs Abs: 2.8 10*3/uL (ref 0.7–4.0)
MCHC: 32.9 g/dL (ref 30.0–36.0)
MCV: 84.2 fl (ref 78.0–100.0)
Monocytes Absolute: 1 10*3/uL (ref 0.1–1.0)
Monocytes Relative: 12 % (ref 3.0–12.0)
Neutro Abs: 3.9 10*3/uL (ref 1.4–7.7)
Neutrophils Relative %: 49 % (ref 43.0–77.0)
Platelets: 150 10*3/uL (ref 150.0–400.0)
RBC: 4.51 Mil/uL (ref 4.22–5.81)
RDW: 15.5 % (ref 11.5–15.5)
WBC: 8 10*3/uL (ref 4.0–10.5)

## 2021-03-26 LAB — HEMOGLOBIN A1C: Hgb A1c MFr Bld: 6.3 % (ref 4.6–6.5)

## 2021-03-26 NOTE — Telephone Encounter (Signed)
Patient paperwork is in the back folder to be signed  ?

## 2021-03-26 NOTE — Telephone Encounter (Signed)
I have placed a home health plan of care and cert . In the bin upfront with a charge sheet  ?

## 2021-03-27 LAB — URINE CULTURE
MICRO NUMBER:: 13141159
SPECIMEN QUALITY:: ADEQUATE

## 2021-03-29 ENCOUNTER — Encounter: Payer: Self-pay | Admitting: Registered Nurse

## 2021-03-29 ENCOUNTER — Other Ambulatory Visit (HOSPITAL_COMMUNITY): Payer: Self-pay

## 2021-03-29 ENCOUNTER — Other Ambulatory Visit: Payer: Self-pay | Admitting: Registered Nurse

## 2021-03-29 DIAGNOSIS — N3000 Acute cystitis without hematuria: Secondary | ICD-10-CM

## 2021-03-29 MED ORDER — SULFAMETHOXAZOLE-TRIMETHOPRIM 800-160 MG PO TABS
1.0000 | ORAL_TABLET | Freq: Two times a day (BID) | ORAL | 0 refills | Status: DC
  Filled 2021-03-29: qty 14, 7d supply, fill #0

## 2021-03-29 NOTE — Progress Notes (Signed)
Can call patient -  ?UTI noted. Sent bactrim twice daily for a week. Keep appts with me, urology, and neurology. ? ?Thanks, ? ?Rich

## 2021-03-29 NOTE — Progress Notes (Signed)
UTI noted on urine culture. Abx sent per sensitvity ? ?Kathrin Ruddy, NP ?

## 2021-04-01 ENCOUNTER — Encounter: Payer: Self-pay | Admitting: Neurology

## 2021-04-02 ENCOUNTER — Other Ambulatory Visit (HOSPITAL_COMMUNITY): Payer: Self-pay

## 2021-04-03 ENCOUNTER — Other Ambulatory Visit: Payer: Self-pay

## 2021-04-03 ENCOUNTER — Other Ambulatory Visit (HOSPITAL_COMMUNITY): Payer: Self-pay

## 2021-04-05 ENCOUNTER — Other Ambulatory Visit (HOSPITAL_COMMUNITY): Payer: Self-pay

## 2021-04-06 ENCOUNTER — Other Ambulatory Visit (HOSPITAL_COMMUNITY): Payer: Self-pay

## 2021-04-06 ENCOUNTER — Other Ambulatory Visit: Payer: Self-pay

## 2021-04-06 DIAGNOSIS — Z466 Encounter for fitting and adjustment of urinary device: Secondary | ICD-10-CM

## 2021-04-06 DIAGNOSIS — N401 Enlarged prostate with lower urinary tract symptoms: Secondary | ICD-10-CM

## 2021-04-06 DIAGNOSIS — N319 Neuromuscular dysfunction of bladder, unspecified: Secondary | ICD-10-CM

## 2021-04-06 DIAGNOSIS — Z8673 Personal history of transient ischemic attack (TIA), and cerebral infarction without residual deficits: Secondary | ICD-10-CM

## 2021-04-06 DIAGNOSIS — Z7982 Long term (current) use of aspirin: Secondary | ICD-10-CM

## 2021-04-06 DIAGNOSIS — Z87440 Personal history of urinary (tract) infections: Secondary | ICD-10-CM

## 2021-04-06 DIAGNOSIS — R338 Other retention of urine: Secondary | ICD-10-CM

## 2021-04-07 ENCOUNTER — Other Ambulatory Visit (HOSPITAL_COMMUNITY): Payer: Self-pay

## 2021-04-12 ENCOUNTER — Other Ambulatory Visit: Payer: Self-pay

## 2021-04-12 ENCOUNTER — Other Ambulatory Visit (HOSPITAL_COMMUNITY): Payer: Self-pay

## 2021-04-13 ENCOUNTER — Other Ambulatory Visit (HOSPITAL_COMMUNITY): Payer: Self-pay

## 2021-04-15 ENCOUNTER — Other Ambulatory Visit (HOSPITAL_COMMUNITY): Payer: Self-pay

## 2021-04-16 ENCOUNTER — Other Ambulatory Visit (HOSPITAL_COMMUNITY): Payer: Self-pay

## 2021-04-21 ENCOUNTER — Ambulatory Visit: Payer: Medicare Other | Admitting: Registered Nurse

## 2021-04-26 ENCOUNTER — Telehealth: Payer: Self-pay | Admitting: Registered Nurse

## 2021-04-26 NOTE — Telephone Encounter (Signed)
I attempted to leave message for patient to call back and schedule Medicare Annual Wellness Visit (AWV).  ? ?Please offer to do virtually or by telephone.  Left office number and my jabber [phone]. ? ?Due for AWVI ? ?Please schedule at anytime with Nurse Health Advisor. ?  ?

## 2021-06-01 ENCOUNTER — Encounter: Payer: Self-pay | Admitting: Podiatry

## 2021-06-01 ENCOUNTER — Ambulatory Visit (INDEPENDENT_AMBULATORY_CARE_PROVIDER_SITE_OTHER): Payer: Medicare Other | Admitting: Podiatry

## 2021-06-01 ENCOUNTER — Telehealth: Payer: Self-pay

## 2021-06-01 DIAGNOSIS — M79674 Pain in right toe(s): Secondary | ICD-10-CM | POA: Diagnosis not present

## 2021-06-01 DIAGNOSIS — B351 Tinea unguium: Secondary | ICD-10-CM | POA: Diagnosis not present

## 2021-06-01 DIAGNOSIS — M79675 Pain in left toe(s): Secondary | ICD-10-CM | POA: Diagnosis not present

## 2021-06-01 NOTE — Progress Notes (Signed)
  Subjective:  Patient ID: Edgar Mckay, male    DOB: 15-Sep-1935,  MRN: [id number]  Chief Complaint  Patient presents with   Nail Problem    Thick painful toenails    86 y.o. male presents with the above complaint. History confirmed with patient.  Nails are thickened elongated and causing quite a bit of pain  Objective:  Physical Exam: warm, good capillary refill, no trophic changes or ulcerative lesions, normal DP and PT pulses, and normal sensory exam. Left Foot: dystrophic yellowed discolored nail plates with subungual debris Right Foot: dystrophic yellowed discolored nail plates with subungual debris  Assessment:   1. Pain due to onychomycosis of toenails of both feet      Plan:  Patient was evaluated and treated and all questions answered.  Discussed the etiology and treatment options for the condition in detail with the patient. Educated patient on the topical and oral treatment options for mycotic nails. Recommended debridement of the nails today. Sharp and mechanical debridement performed of all painful and mycotic nails today. Nails debrided in length and thickness using a nail nipper to level of comfort. Discussed treatment options including appropriate shoe gear. Follow up as needed for painful nails.    Return in about 3 months (around 09/01/2021) for painful thick nails.

## 2021-06-01 NOTE — Telephone Encounter (Signed)
Sent foley catheter plan to scan.

## 2021-07-26 ENCOUNTER — Other Ambulatory Visit: Payer: Self-pay

## 2021-07-26 ENCOUNTER — Encounter (HOSPITAL_COMMUNITY): Payer: Self-pay | Admitting: Emergency Medicine

## 2021-07-26 ENCOUNTER — Emergency Department (HOSPITAL_COMMUNITY)
Admission: EM | Admit: 2021-07-26 | Discharge: 2021-07-26 | Disposition: A | Discharge status: Home / Self Care | Payer: Medicare Other | Attending: Emergency Medicine | Admitting: Emergency Medicine

## 2021-07-26 DIAGNOSIS — R339 Retention of urine, unspecified: Secondary | ICD-10-CM | POA: Insufficient documentation

## 2021-07-26 DIAGNOSIS — Z7982 Long term (current) use of aspirin: Secondary | ICD-10-CM | POA: Diagnosis not present

## 2021-07-26 DIAGNOSIS — T83098A Other mechanical complication of other indwelling urethral catheter, initial encounter: Secondary | ICD-10-CM | POA: Insufficient documentation

## 2021-07-26 DIAGNOSIS — T839XXA Unspecified complication of genitourinary prosthetic device, implant and graft, initial encounter: Secondary | ICD-10-CM

## 2021-07-26 LAB — CBC WITH DIFFERENTIAL/PLATELET
Abs Immature Granulocytes: 0.03 10*3/uL (ref 0.00–0.07)
Basophils Absolute: 0.1 10*3/uL (ref 0.0–0.1)
Basophils Relative: 1 %
Eosinophils Absolute: 0.2 10*3/uL (ref 0.0–0.5)
Eosinophils Relative: 3 %
HCT: 41.3 % (ref 39.0–52.0)
Hemoglobin: 13 g/dL (ref 13.0–17.0)
Immature Granulocytes: 0 %
Lymphocytes Relative: 28 %
Lymphs Abs: 2 10*3/uL (ref 0.7–4.0)
MCH: 27.1 pg (ref 26.0–34.0)
MCHC: 31.5 g/dL (ref 30.0–36.0)
MCV: 86.2 fL (ref 80.0–100.0)
Monocytes Absolute: 0.6 10*3/uL (ref 0.1–1.0)
Monocytes Relative: 9 %
Neutro Abs: 4.2 10*3/uL (ref 1.7–7.7)
Neutrophils Relative %: 59 %
Platelets: 157 10*3/uL (ref 150–400)
RBC: 4.79 MIL/uL (ref 4.22–5.81)
RDW: 14.5 % (ref 11.5–15.5)
WBC: 7.1 10*3/uL (ref 4.0–10.5)
nRBC: 0 % (ref 0.0–0.2)

## 2021-07-26 LAB — BASIC METABOLIC PANEL
Anion gap: 9 (ref 5–15)
BUN: 11 mg/dL (ref 8–23)
CO2: 26 mmol/L (ref 22–32)
Calcium: 9.5 mg/dL (ref 8.9–10.3)
Chloride: 109 mmol/L (ref 98–111)
Creatinine, Ser: 0.85 mg/dL (ref 0.61–1.24)
GFR, Estimated: >60 mL/min (ref >60)
Glucose, Bld: 118 mg/dL — ABNORMAL HIGH (ref 70–99)
Potassium: 3.8 mmol/L (ref 3.5–5.1)
Sodium: 144 mmol/L (ref 135–145)

## 2021-07-26 NOTE — ED Notes (Signed)
DC instructions reviewed with pt. Pt verbalized understanding.  PT DC.  

## 2021-07-26 NOTE — ED Provider Triage Note (Cosign Needed Addendum)
Emergency Medicine Provider Triage Evaluation Note  Edgar Boom Sr. , a 86 y.o. male  was evaluated in triage.  Pt complains of urinary retention. He is having urine around the catheter since Saturday night./ has his catheter changed monthly and is due for change next month.   Review of Systems  Positive: Urinary retention Negative: fever  Physical Exam  BP (!) 155/87 (BP Location: Left Arm)   Pulse 79   Temp 98.6 F (37 C) (Oral)   Resp 14   SpO2 99%  Gen:   Awake, no distress   Resp:  Normal effort  MSK:   Moves extremities without difficulty  Other:  Abd distention  Medical Decision Making  Medically screening exam initiated at 9:27 AM.  Appropriate orders placed.  Edgar Smoke Garcialopez Sr. was informed that the remainder of the evaluation will be completed by another provider, this initial triage assessment does not replace that evaluation, and the importance of remaining in the ED until their evaluation is complete.  Work up initiated No visible retention on  POCUS.,  Edgar Captain, PA-C 07/26/21 0931    Edgar Captain, PA-C 07/26/21 0940

## 2021-07-26 NOTE — ED Provider Notes (Signed)
[facility] EMERGENCY DEPARTMENT Provider Note   CSN: [id number] Arrival date & time: 07/26/21  7048     History  No chief complaint on file.   Edgar Smoke Dillie Sr. is a 86 y.o. male with history of urinary retention who presents emergency department for evaluation of Foley catheter problem.  Patient states he had a catheter replaced on June 23 or 24.  Today when he tried to urinate, urine was going around the bag and out the penis.  He has no pain.  My attending, Dr. Durwin Nora also evaluated patient with me, the time of our evaluation, patient states that his problem has resolved.  He states that when they jostled his belly around during the bladder scan, he thinks the problem was fixed.  He is now filling up the bag and just recently emptied it.  He has no other complaints.   HPI     Home Medications Prior to Admission medications   Medication Sig Start Date End Date Taking? Authorizing Provider  acetaminophen (TYLENOL) 325 MG tablet Take 2 tablets (650 mg total) by mouth every 6 (six) hours as needed. 12/05/20   Sabino Dick, DO  aspirin 81 MG chewable tablet Chew 1 tablet (81 mg total) by mouth daily. 03/10/21   Bonnita Nasuti, AGNP-C  atorvastatin (LIPITOR) 40 MG tablet Take 1 tablet (40 mg total) by mouth at bedtime. 12/08/20   Fayette Pho, MD  cyanocobalamin 1000 MCG tablet Take 1 tablet (1,000 mcg total) by mouth daily for Vitamin Supplement. 03/10/21   Bonnita Nasuti, AGNP-C  docusate sodium (COLACE) 100 MG capsule Take 1 capsule (100 mg total) by mouth daily for constipation. 03/10/21   Bonnita Nasuti, AGNP-C  finasteride (PROSCAR) 5 MG tablet Take 1 tablet (5 mg total) by mouth daily. 12/05/20   Sabino Dick, DO  folic acid (FOLVITE) 1 MG tablet Take 1 tablet (1 mg total) by mouth daily for supplement. 03/10/21   Bonnita Nasuti, AGNP-C  gabapentin (NEURONTIN) 100 MG capsule Take 1 capsule (100 mg total) by mouth 3 (three) times daily for  neuropathy. 03/10/21   Bonnita Nasuti, AGNP-C  Multiple Vitamin (MULTIVITAMIN WITH MINERALS) TABS tablet Take 1 tablet by mouth daily. 12/05/20   Sabino Dick, DO  nicotine (NICODERM CQ - DOSED IN MG/24 HOURS) 21 mg/24hr patch Place 1 patch (21 mg total) onto the skin daily. 12/05/20   Sabino Dick, DO  polyethylene glycol (MIRALAX / GLYCOLAX) 17 g packet Take 17 g by mouth daily as needed for mild constipation. 12/05/20   Sabino Dick, DO  senna (SENOKOT) 8.6 MG TABS tablet Take 2 tablets (17.2 mg total) by mouth daily. 12/07/20   Princess Bruins, DO  sertraline (ZOLOFT) 25 MG tablet Take 1 tablet (25 mg total) by mouth in the morning for depression. 03/10/21   Bonnita Nasuti, AGNP-C  sulfamethoxazole-trimethoprim (BACTRIM DS) 800-160 MG tablet Take 1 tablet by mouth 2 (two) times daily. 03/29/21   Janeece Agee, NP  tamsulosin (FLOMAX) 0.4 MG CAPS capsule Take 1 capsule (0.4 mg total) by mouth daily. 03/10/21   Bonnita Nasuti, AGNP-C  thiamine 100 MG tablet Take 1 tablet (100 mg total) by mouth daily. 12/05/20   Sabino Dick, DO      Allergies    Patient has no known allergies.    Review of Systems   Review of Systems  Respiratory:  Negative for cough.   Cardiovascular:  Negative for chest pain.  Genitourinary:  Negative for dysuria, hematuria and penile pain.  Physical Exam Updated Vital Signs BP (!) 164/76   Pulse 67   Temp 98.4 F (36.9 C) (Oral)   Resp 16   SpO2 100%  Physical Exam Vitals and nursing note reviewed.  Constitutional:      General: He is not in acute distress.    Appearance: He is not ill-appearing.     Comments: Catheter bag with urine noted within  HENT:     Head: Atraumatic.  Eyes:     Conjunctiva/sclera: Conjunctivae normal.  Cardiovascular:     Rate and Rhythm: Normal rate and regular rhythm.     Pulses: Normal pulses.     Heart sounds: No murmur heard. Pulmonary:     Effort: Pulmonary effort is normal. No respiratory  distress.     Breath sounds: Normal breath sounds.  Abdominal:     General: Abdomen is flat. There is no distension.     Palpations: Abdomen is soft.     Tenderness: There is no abdominal tenderness.  Musculoskeletal:        General: Normal range of motion.     Cervical back: Normal range of motion.  Skin:    General: Skin is warm and dry.     Capillary Refill: Capillary refill takes less than 2 seconds.  Neurological:     General: No focal deficit present.     Mental Status: He is alert.  Psychiatric:        Mood and Affect: Mood normal.     ED Results / Procedures / Treatments   Labs (all labs ordered are listed, but only abnormal results are displayed) Labs Reviewed  BASIC METABOLIC PANEL - Abnormal; Notable for the following components:      Result Value   Glucose, Bld 118 (*)    All other components within normal limits  CBC WITH DIFFERENTIAL/PLATELET  URINALYSIS, ROUTINE W REFLEX MICROSCOPIC    EKG None  Radiology No results found.  Procedures Procedures    Medications Ordered in ED Medications - No data to display  ED Course/ Medical Decision Making/ A&P                           Medical Decision Making  86 year old male presents for catheter problem.  Vitals without significant abnormality.  Dr. Durwin Nora applied ultrasound probe to the bladder and  noted it was empty.  Urine noted within the Foley catheter bag.  Patient reports resolution of symptoms prior to evaluation.  No further work-up or treatment indicated at this time.  Patient was discharged home in good condition. Final Clinical Impression(s) / ED Diagnoses Final diagnoses:  Foley catheter problem, initial encounter [facility])    Rx / DC Orders ED Discharge Orders     None         Delight Ovens 07/26/21 1714    Gloris Manchester, MD 07/27/21 2315

## 2021-07-26 NOTE — ED Triage Notes (Signed)
Pt states his foley catheter is not operating properly. When he tries to urinate, his urine comes out around the foley. Denies any pain.

## 2021-07-26 NOTE — Discharge Instructions (Signed)
Follow-up with your urologist for your regularly scheduled catheter replacement.

## 2021-07-27 ENCOUNTER — Other Ambulatory Visit: Payer: Self-pay | Admitting: Registered Nurse

## 2021-07-27 DIAGNOSIS — T839XXD Unspecified complication of genitourinary prosthetic device, implant and graft, subsequent encounter: Secondary | ICD-10-CM

## 2021-07-29 ENCOUNTER — Emergency Department (HOSPITAL_COMMUNITY)
Admission: EM | Admit: 2021-07-29 | Discharge: 2021-07-30 | Disposition: A | Discharge status: Home / Self Care | Payer: Medicare Other | Attending: Emergency Medicine | Admitting: Emergency Medicine

## 2021-07-29 ENCOUNTER — Encounter (HOSPITAL_COMMUNITY): Payer: Self-pay | Admitting: Emergency Medicine

## 2021-07-29 DIAGNOSIS — N3000 Acute cystitis without hematuria: Secondary | ICD-10-CM

## 2021-07-29 DIAGNOSIS — T83098A Other mechanical complication of other indwelling urethral catheter, initial encounter: Secondary | ICD-10-CM | POA: Diagnosis present

## 2021-07-29 DIAGNOSIS — Z7982 Long term (current) use of aspirin: Secondary | ICD-10-CM | POA: Insufficient documentation

## 2021-07-29 DIAGNOSIS — T839XXA Unspecified complication of genitourinary prosthetic device, implant and graft, initial encounter: Secondary | ICD-10-CM

## 2021-07-29 LAB — URINALYSIS, ROUTINE W REFLEX MICROSCOPIC
Bilirubin Urine: NEGATIVE
Glucose, UA: NEGATIVE mg/dL
Glucose, UA: NEGATIVE mg/dL
Hgb urine dipstick: NEGATIVE
Hgb urine dipstick: NEGATIVE
Ketones, ur: NEGATIVE mg/dL
Ketones, ur: NEGATIVE mg/dL
Nitrite: NEGATIVE
Nitrite: POSITIVE — AB
Protein, ur: >=300 mg/dL — AB
Protein, ur: NEGATIVE mg/dL
Specific Gravity, Urine: 1.02 (ref 1.005–1.030)
Specific Gravity, Urine: 1.018 (ref 1.005–1.030)
WBC, UA: >50 WBC/hpf — ABNORMAL HIGH (ref 0–5)
pH: 9 — ABNORMAL HIGH (ref 5.0–8.0)
pH: 5 (ref 5.0–8.0)

## 2021-07-29 LAB — BASIC METABOLIC PANEL
Anion gap: 9 (ref 5–15)
BUN: 12 mg/dL (ref 8–23)
CO2: 24 mmol/L (ref 22–32)
Calcium: 9.2 mg/dL (ref 8.9–10.3)
Chloride: 109 mmol/L (ref 98–111)
Creatinine, Ser: 0.79 mg/dL (ref 0.61–1.24)
GFR, Estimated: >60 mL/min (ref >60)
Glucose, Bld: 105 mg/dL — ABNORMAL HIGH (ref 70–99)
Potassium: 3.7 mmol/L (ref 3.5–5.1)
Sodium: 142 mmol/L (ref 135–145)

## 2021-07-29 LAB — CBC WITH DIFFERENTIAL/PLATELET
Abs Immature Granulocytes: 0.02 10*3/uL (ref 0.00–0.07)
Basophils Absolute: 0 10*3/uL (ref 0.0–0.1)
Basophils Relative: 1 %
Eosinophils Absolute: 0.2 10*3/uL (ref 0.0–0.5)
Eosinophils Relative: 3 %
HCT: 39.8 % (ref 39.0–52.0)
Hemoglobin: 12.9 g/dL — ABNORMAL LOW (ref 13.0–17.0)
Immature Granulocytes: 0 %
Lymphocytes Relative: 32 %
Lymphs Abs: 2.1 10*3/uL (ref 0.7–4.0)
MCH: 27.4 pg (ref 26.0–34.0)
MCHC: 32.4 g/dL (ref 30.0–36.0)
MCV: 84.5 fL (ref 80.0–100.0)
Monocytes Absolute: 0.7 10*3/uL (ref 0.1–1.0)
Monocytes Relative: 11 %
Neutro Abs: 3.5 10*3/uL (ref 1.7–7.7)
Neutrophils Relative %: 53 %
Platelets: 169 10*3/uL (ref 150–400)
RBC: 4.71 MIL/uL (ref 4.22–5.81)
RDW: 14.5 % (ref 11.5–15.5)
WBC: 6.7 10*3/uL (ref 4.0–10.5)
nRBC: 0 % (ref 0.0–0.2)

## 2021-07-29 MED ORDER — CEFTRIAXONE SODIUM 1 G IJ SOLR
1.0000 g | Freq: Once | INTRAMUSCULAR | Status: AC
  Administered 2021-07-29: 1 g via INTRAMUSCULAR
  Filled 2021-07-29: qty 10

## 2021-07-29 MED ORDER — STERILE WATER FOR INJECTION IJ SOLN
INTRAMUSCULAR | Status: AC
  Administered 2021-07-29: 2 mL
  Filled 2021-07-29: qty 10

## 2021-07-29 NOTE — ED Provider Triage Note (Signed)
Emergency Medicine Provider Triage Evaluation Note  Edgar Boom Sr. , a 86 y.o. male  was evaluated in triage.  Pt complains of catheter malfunction.  Has been seen for this multiple times in the ED before.  This was most recently placed 2 weeks ago, states urine is dripping on the side of the catheter.  Has been using catheters on and off for the last 10 to 20 years.  Denies fever, abdominal pain, chest pain, shortness of breath.  Review of Systems  Positive:  Negative: See above  Physical Exam  BP (!) 147/77 (BP Location: Right Arm)   Pulse 75   Temp 98.8 F (37.1 C) (Oral)   Resp 18   SpO2 99%  Gen:   Awake, no distress   Resp:  Normal effort  MSK:   Moves extremities without difficulty  Other:  Abdomen soft, nontender  Medical Decision Making  Medically screening exam initiated at 4:45 PM.  Appropriate orders placed.  Edgar Smoke Dohse Sr. was informed that the remainder of the evaluation will be completed by another provider, this initial triage assessment does not replace that evaluation, and the importance of remaining in the ED until their evaluation is complete.    Cecil Cobbs, PA-C 07/29/21 1646

## 2021-07-29 NOTE — ED Triage Notes (Signed)
Patient here from home reporting foley catheter leaking. Seen for same multiple times. States that urine leaks around penis  and not into bag.

## 2021-07-29 NOTE — ED Notes (Signed)
Received verbal order from [place], [place] to attempt to deflate foley and reposition the catheter. This RN did this and re inflated catheter balloon. The PA also wanted me to flush it. I attempted to flush but was unsuccessful.

## 2021-07-29 NOTE — ED Provider Notes (Signed)
[facility]-EMERGENCY DEPT Provider Note   CSN: [id number] Arrival date & time: 07/29/21  1555     History  Chief Complaint  Patient presents with   Catheter Leakage    Edgar Smoke Railey Sr. is a 86 y.o. male.  HPI  86 year old male presents emergency department with complaints of catheter leakage.  He has been seen multiple times in the emergency department for the same complaint.  He was recently seen in the emergency department 3 days ago for the same complaint.  Foley catheter began to drain spontaneously.  Other symptoms include dysuria.  Denies fever, chills, night sweats, chest pain, shortness of breath, abdominal pain, nausea/vomiting/diarrhea, change in bowel habits.  Home Medications Prior to Admission medications   Medication Sig Start Date End Date Taking? Authorizing Provider  sulfamethoxazole-trimethoprim (BACTRIM DS) 800-160 MG tablet Take 1 tablet by mouth 2 (two) times daily for 7 days. 07/30/21 08/06/21 Yes Carroll Sage, PA-C  acetaminophen (TYLENOL) 325 MG tablet Take 2 tablets (650 mg total) by mouth every 6 (six) hours as needed. 12/05/20   Sabino Dick, DO  aspirin 81 MG chewable tablet Chew 1 tablet (81 mg total) by mouth daily. 03/10/21   Bonnita Nasuti, AGNP-C  atorvastatin (LIPITOR) 40 MG tablet Take 1 tablet (40 mg total) by mouth at bedtime. 12/08/20   Fayette Pho, MD  cyanocobalamin 1000 MCG tablet Take 1 tablet (1,000 mcg total) by mouth daily for Vitamin Supplement. 03/10/21   Bonnita Nasuti, AGNP-C  docusate sodium (COLACE) 100 MG capsule Take 1 capsule (100 mg total) by mouth daily for constipation. 03/10/21   Bonnita Nasuti, AGNP-C  finasteride (PROSCAR) 5 MG tablet Take 1 tablet (5 mg total) by mouth daily. 12/05/20   Sabino Dick, DO  folic acid (FOLVITE) 1 MG tablet Take 1 tablet (1 mg total) by mouth daily for supplement. 03/10/21   Bonnita Nasuti, AGNP-C  gabapentin (NEURONTIN) 100 MG capsule Take 1 capsule  (100 mg total) by mouth 3 (three) times daily for neuropathy. 03/10/21   Bonnita Nasuti, AGNP-C  Multiple Vitamin (MULTIVITAMIN WITH MINERALS) TABS tablet Take 1 tablet by mouth daily. 12/05/20   Sabino Dick, DO  nicotine (NICODERM CQ - DOSED IN MG/24 HOURS) 21 mg/24hr patch Place 1 patch (21 mg total) onto the skin daily. 12/05/20   Sabino Dick, DO  polyethylene glycol (MIRALAX / GLYCOLAX) 17 g packet Take 17 g by mouth daily as needed for mild constipation. 12/05/20   Sabino Dick, DO  senna (SENOKOT) 8.6 MG TABS tablet Take 2 tablets (17.2 mg total) by mouth daily. 12/07/20   Princess Bruins, DO  sertraline (ZOLOFT) 25 MG tablet Take 1 tablet (25 mg total) by mouth in the morning for depression. 03/10/21   Bonnita Nasuti, AGNP-C  tamsulosin (FLOMAX) 0.4 MG CAPS capsule Take 1 capsule (0.4 mg total) by mouth daily. 03/10/21   Bonnita Nasuti, AGNP-C  thiamine 100 MG tablet Take 1 tablet (100 mg total) by mouth daily. 12/05/20   Sabino Dick, DO      Allergies    Patient has no known allergies.    Review of Systems   Review of Systems  All other systems reviewed and are negative.   Physical Exam Updated Vital Signs BP 136/84   Pulse 80   Temp 97.9 F (36.6 C) (Oral)   Resp 17   Ht 5\' 9"  (1.753 m)   Wt 90.7 kg   SpO2 93%   BMI 29.53 kg/m  Physical Exam Vitals and nursing  note reviewed.  Constitutional:      General: He is not in acute distress.    Appearance: He is well-developed.  HENT:     Head: Normocephalic and atraumatic.  Eyes:     Conjunctiva/sclera: Conjunctivae normal.  Cardiovascular:     Rate and Rhythm: Normal rate and regular rhythm.     Heart sounds: No murmur heard. Pulmonary:     Effort: Pulmonary effort is normal. No respiratory distress.     Breath sounds: Normal breath sounds.  Abdominal:     Palpations: Abdomen is soft.     Tenderness: There is no abdominal tenderness.  Genitourinary:    Comments: Patient uncircumcised.   Catheter in place.  No obvious signs of rash or leakage around catheter and no active drainage catheter into leg bag. Musculoskeletal:        General: No swelling.     Cervical back: Neck supple.  Skin:    General: Skin is warm and dry.     Capillary Refill: Capillary refill takes less than 2 seconds.  Neurological:     Mental Status: He is alert.  Psychiatric:        Mood and Affect: Mood normal.     ED Results / Procedures / Treatments   Labs (all labs ordered are listed, but only abnormal results are displayed) Labs Reviewed  URINE CULTURE - Abnormal; Notable for the following components:      Result Value   Culture   (*)    Value: >=100,000 COLONIES/mL ESCHERICHIA COLI Confirmed Extended Spectrum Beta-Lactamase Producer (ESBL).  In bloodstream infections from ESBL organisms, carbapenems are preferred over piperacillin/tazobactam. They are shown to have a lower risk of mortality. >=100,000 COLONIES/mL PROTEUS PENNERI    Organism ID, Bacteria ESCHERICHIA COLI (*)    Organism ID, Bacteria PROTEUS PENNERI (*)    All other components within normal limits  BASIC METABOLIC PANEL - Abnormal; Notable for the following components:   Glucose, Bld 105 (*)    All other components within normal limits  CBC WITH DIFFERENTIAL/PLATELET - Abnormal; Notable for the following components:   Hemoglobin 12.9 (*)    All other components within normal limits  URINALYSIS, ROUTINE W REFLEX MICROSCOPIC - Abnormal; Notable for the following components:   Color, Urine AMBER (*)    APPearance TURBID (*)    pH 9.0 (*)    Bilirubin Urine MODERATE (*)    Protein, ur >=300 (*)    Leukocytes,Ua MODERATE (*)    Bacteria, UA MANY (*)    All other components within normal limits  URINALYSIS, ROUTINE W REFLEX MICROSCOPIC - Abnormal; Notable for the following components:   APPearance HAZY (*)    Nitrite POSITIVE (*)    Leukocytes,Ua LARGE (*)    WBC, UA >50 (*)    Bacteria, UA RARE (*)    All other  components within normal limits    EKG None  Radiology No results found.  Procedures Procedures    Medications Ordered in ED Medications  cefTRIAXone (ROCEPHIN) injection 1 g (1 g Intramuscular Given 07/29/21 2314)  sterile water (preservative free) injection (2 mLs  Given 07/29/21 2314)    ED Course/ Medical Decision Making/ A&P                           Medical Decision Making Amount and/or Complexity of Data Reviewed Labs: ordered.  Risk Prescription drug management.   This patient presents to the ED for concern  of catheter leakage, this involves an extensive number of treatment options, and is a complaint that carries with it a high risk of complications and morbidity.  The differential diagnosis includes urinary tract infection, occluded Foley catheter, medical noncompliance   Co morbidities that complicate the patient evaluation  Urinary retention, TIA   Additional history obtained:  Additional history obtained from EMR External records from outside source obtained and reviewed including multiple visits for problems with Foley catheter   Lab Tests:  I Ordered, and personally interpreted labs.  The pertinent results include: Urinalysis indicative of large leukocyte, positive nitrite, greater than 50 WBC with 0-5 squamous epithelial cells depicting urinary tract infection.  Urine culture pending upon shift change.   Imaging Studies ordered:  I ordered imaging studies including bladder scan I independently visualized and interpreted imaging which showed 40 MLS of fluid I agree with the radiologist interpretation   Cardiac Monitoring: / EKG:  The patient was maintained on a cardiac monitor.  I personally viewed and interpreted the cardiac monitored which showed an underlying rhythm of: Sinus rhythm   Consultations Obtained:  N/a   Problem List / ED Course / Critical interventions / Medication management  Urinary tract infection I ordered medication  including Rocephin for urinary tract infection   Reevaluation of the patient after these medicines showed that the patient improved I have reviewed the patients home medicines and have made adjustments as needed   Social Determinants of Health:  Current cigarette smoker.  Denies illicit drug use.   Test / Admission - Considered:  Catheter malfunction/urinary tract infection Vitals signs within normal range and stable throughout visit. Laboratory studies significant for: See above Patient's symptoms secondary to occluded Foley catheter.  Patient states that he was not aware of proper Foley catheter care and has suffered multiple visits to the emergency department secondary to noncompliance catheter care.  Patient also had urinary tract infection as evidenced by urinalysis.  Treated with 1 dose of Rocephin while in emergency department.  Repeat urinalysis pending upon shift change due to poor sample obtained from leg bag abdomen stagnant for some time.  I suspect repeat urinalysis which show infection as the first patient to be DC'd with antibiotics.  Urine culture was also obtained.  Patient care handed off to Berle Mull, PA-C at shift change.  Patient stable upon shift change.        Final Clinical Impression(s) / ED Diagnoses Final diagnoses:  Problem with Foley catheter, initial encounter (HCC)  Acute cystitis without hematuria    Rx / DC Orders ED Discharge Orders          Ordered    sulfamethoxazole-trimethoprim (BACTRIM DS) 800-160 MG tablet  2 times daily        07/30/21 0025              Peter Garter, [place] 08/01/21 1052    Bethann Berkshire, MD 08/03/21 1731

## 2021-07-29 NOTE — Discharge Instructions (Addendum)
Please with Foley catheter care instructions so as to avoid future problems of obstruction.  Using sterile technique to flush catheter regularly.  Your urinalysis today was consistent for urinary tract infection.  I gave you 1 dose of antibiotic while in the emergency department.  You have a UTI started on antibiotics take as prescribed please follow-up with your PCP or urologist for further evaluation  Come back to the emergency department if you develop chest pain, shortness of breath, severe abdominal pain, uncontrolled nausea, vomiting, diarrhea.

## 2021-07-29 NOTE — ED Notes (Signed)
This RN has called the lab to check on the result status of this patient's urine sample. The lab tech states they do not have it. PA informed. Another urine sample was collected from the patient's leg bag and sent down to the lab.

## 2021-07-30 ENCOUNTER — Telehealth: Payer: Self-pay | Admitting: Registered Nurse

## 2021-07-30 ENCOUNTER — Other Ambulatory Visit (HOSPITAL_COMMUNITY): Payer: Self-pay

## 2021-07-30 MED ORDER — SULFAMETHOXAZOLE-TRIMETHOPRIM 800-160 MG PO TABS
1.0000 | ORAL_TABLET | Freq: Two times a day (BID) | ORAL | 0 refills | Status: AC
  Filled 2021-07-30: qty 14, 7d supply, fill #0

## 2021-07-30 NOTE — ED Notes (Signed)
Pts son at beside to drive the patient home. Pt and son verbalized understanding of d/c instructions, prescriptions and follow up care. Pt wheeled out of ED via wheelchair.

## 2021-07-30 NOTE — ED Provider Notes (Signed)
Received at shift change from Laruth Bouchard, PA-C please see note for full detail  Medical history including urinary retention, indwelling catheter presents with leaking catheter, he has no other complaints.  Per previous provider follow-up on UA treat accordingly Physical Exam  BP (!) 150/76   Pulse 75   Temp 97.9 F (36.6 C) (Oral)   Resp 17   Ht 5\' 9"  (1.753 m)   Wt 90.7 kg   SpO2 96%   BMI 29.53 kg/m   Physical Exam Vitals and nursing note reviewed.  Constitutional:      General: He is not in acute distress.    Appearance: He is not ill-appearing.  HENT:     Head: Normocephalic and atraumatic.     Nose: No congestion.  Eyes:     Conjunctiva/sclera: Conjunctivae normal.  Cardiovascular:     Rate and Rhythm: Normal rate and regular rhythm.     Pulses: Normal pulses.  Pulmonary:     Effort: Pulmonary effort is normal.  Abdominal:     Palpations: Abdomen is soft.     Tenderness: There is no abdominal tenderness. There is no right CVA tenderness or left CVA tenderness.  Genitourinary:    Comments: Indwelling Foley in place, patent and well draining Skin:    General: Skin is warm and dry.  Neurological:     Mental Status: He is alert.  Psychiatric:        Mood and Affect: Mood normal.     Procedures  Procedures  ED Course / MDM    Medical Decision Making Amount and/or Complexity of Data Reviewed Labs: ordered.  Risk Prescription drug management.    Co morbidities that complicate the patient evaluation  Indwelling catheter  Social Determinants of Health:  Geriatric    Lab Tests:  I Ordered, and personally interpreted labs.  The pertinent results include: CBC shows no sick anemia hemoglobin 12.9, BMP shows glucose of 105, UA shows nitrates leukocytes white blood cells rare bacteria   Imaging Studies ordered:  I ordered imaging studies including N/A I independently visualized and interpreted imaging which showed N/A I agree with the  radiologist interpretation   Cardiac Monitoring:  The patient was maintained on a cardiac monitor.  I personally viewed and interpreted the cardiac monitored which showed an underlying rhythm of: N/A   Medicines ordered and prescription drug management:  I ordered medication including ceftriaxone I have reviewed the patients home medicines and have made adjustments as needed  Critical Interventions:  N/A   Reevaluation:  Patient is reassessed resting comfortably, having no complaints, Foley catheter is drained out difficulty, he is agreement plan discharge at this time.  Consultations Obtained:  N/A   Test Considered:  N/A    Rule out Low suspicion for sepsis nontoxic-appearing vital signs reassuring no leukocytosis.  Low suspicion for kidney stone or [place] there is no flank tenderness no suprapubic pain, no blood noted in his UA.     Dispostion and problem list  After consideration of the diagnostic results and the patients response to treatment, I feel that the patent would benefit from discharge.  UTI-UA is concerning for UTI, on previous cultures grew Proteus, sensitive to Bactrim will start him this, culture urine follow-up with PCP for further evaluation and strict return precautions.           , PA-C 07/30/21 0025    08/01/21, MD 07/30/21 [id number]

## 2021-07-30 NOTE — ED Notes (Signed)
Lab to add on urine culture

## 2021-07-30 NOTE — ED Notes (Signed)
This RN called the patient's son who states he will be here shortly to take him home.

## 2021-07-30 NOTE — Telephone Encounter (Signed)
Phone number on note is not correct when I called stated this was wrong number

## 2021-07-30 NOTE — Telephone Encounter (Signed)
Caller name: Cassandra with [facility]  On DPR? :yes/no: No  Call back number: [phone]  Provider they see: Janeece Agee  Reason for call: calling to verify orders nursing.  1 x wk for 6 wks PRN

## 2021-08-01 LAB — URINE CULTURE: Culture: >=100000 — AB

## 2021-08-02 ENCOUNTER — Telehealth: Payer: Self-pay | Admitting: Emergency Medicine

## 2021-08-02 NOTE — Telephone Encounter (Signed)
Noted  Thanks for trying  Luan Pulling

## 2021-08-02 NOTE — Progress Notes (Signed)
.ED Antimicrobial Stewardship Positive Culture Follow Up   Edgar MATHIESON Sr. is an 86 y.o. male who presented to [facility] on 07/29/2021 with a chief complaint of  Chief Complaint  Patient presents with   Catheter Leakage    Recent Results (from the past 720 hour(s))  Urine Culture     Status: Abnormal   Collection Time: 07/29/21 10:45 PM   Specimen: In/Out Cath Urine  Result Value Ref Range Status   Specimen Description   Final    IN/OUT CATH URINE Performed at [facility], [address], [place], [place] [address]    Special Requests   Final    NONE Performed at [facility], [address], [place], [place] [address]    Culture (A)  Final    >=100,000 COLONIES/mL ESCHERICHIA COLI Confirmed Extended Spectrum Beta-Lactamase Producer (ESBL).  In bloodstream infections from ESBL organisms, carbapenems are preferred over piperacillin/tazobactam. They are shown to have a lower risk of mortality. >=100,000 COLONIES/mL PROTEUS PENNERI    Report Status 08/01/2021 FINAL  Final   Organism ID, Bacteria ESCHERICHIA COLI (A)  Final   Organism ID, Bacteria PROTEUS PENNERI (A)  Final      Susceptibility   Escherichia coli - MIC*    AMPICILLIN >=32 RESISTANT Resistant     CEFAZOLIN >=64 RESISTANT Resistant     CEFEPIME 8 INTERMEDIATE Intermediate     CEFTRIAXONE >=64 RESISTANT Resistant     CIPROFLOXACIN >=4 RESISTANT Resistant     GENTAMICIN <=1 SENSITIVE Sensitive     IMIPENEM <=0.25 SENSITIVE Sensitive     NITROFURANTOIN 32 SENSITIVE Sensitive     TRIMETH/SULFA >=320 RESISTANT Resistant     AMPICILLIN/SULBACTAM 16 INTERMEDIATE Intermediate     PIP/TAZO <=4 SENSITIVE Sensitive     * >=100,000 COLONIES/mL ESCHERICHIA COLI   Proteus penneri - MIC*    AMPICILLIN >=32 RESISTANT Resistant     CEFAZOLIN >=64 RESISTANT Resistant     CEFEPIME <=0.12 SENSITIVE Sensitive     CEFTRIAXONE <=0.25 SENSITIVE Sensitive     CIPROFLOXACIN <=0.25 SENSITIVE  Sensitive     GENTAMICIN <=1 SENSITIVE Sensitive     IMIPENEM 1 SENSITIVE Sensitive     NITROFURANTOIN RESISTANT Resistant     TRIMETH/SULFA <=20 SENSITIVE Sensitive     AMPICILLIN/SULBACTAM 16 INTERMEDIATE Intermediate     PIP/TAZO <=4 SENSITIVE Sensitive     * >=100,000 COLONIES/mL PROTEUS PENNERI     Presented on 07/29/21 w/ c/o foley catheter leakage and dysuria, he had presented 3 days prior w/ the same complaint. Patient was noted to be afebrile w/ WBC's WNL. Was discharged on Bactrim 800-180 mg BID x 7 days as a result of UA results.   Two UA's were taken, one taken via catheter that showed: Leuk mod, Nitrite neg, WBC 11-20, and many bacteria. A second was taken from the Foley catheter bag and displayed: Leuk large, nitrite positive, WBC >50 and rare bacteria. The culture was ultimately created from the catheter bag urine sample. Culture shows ESBL E. Coli and proteus penneri, both bacteria that the patient has grown before on prior urine cultures. This, combined w/ the fact that the culture was drawn from the cath bag creates the possibility that the grown bacteria are the result of contamination/colonization.   Spoke to provider and it was agreed that d/t the patient's very minor symptoms, repeated growth of the same bacteria/poor culture source (cath bag), normal WBC/lack of fever, that patient should be contacted for a symptom  check. If the patient reports symptoms, the Bactrim should be continued and a dose of fosfomycin 3 g PO once should be given. If the patient is asymptomatic Bactrim should be d/c'ed  New antibiotic prescription:  - Call for symptom check  -IF SYMPTOMATIC ON Check: fosfomycin 3 g PO once  ED Provider: Truman Hayward 08/02/2021, 8:37 AM Clinical Pharmacist Monday - Friday phone -  [phone] Saturday - Sunday phone - [phone]

## 2021-08-02 NOTE — Progress Notes (Deleted)
NEUROLOGY CONSULTATION NOTE  Edgar C Duell Mckay. MRN: [id number] DOB: 17-May-1935  Referring provider: Janeece Agee, NP Primary care provider: Janeece Agee, NP  Reason for consult:  transient ischemic attack  Assessment/Plan:   ***   Subjective:  Edgar Mckay is an 86 year old male with tobacco use disorder who presents for transient ischemic attack.  History supplemented by hospital records and referring provider's note.  He was admitted to [facility] in November 2022 for urinary retention and found to have an obstructive indwelling catheter with UTI.  During admission, he had episode of lethargy with generalized weakness of all four extremities and difficulty forming words for about 20 minutes.  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  Echo showed EF 55-60%.  Patient deferred carotid ultrasound as he would not want a carotid endarterectomy if indicated.    03/25/2021 LABS:  LDL 53, Hgb A1c 6.3.  Current medications:  ASA 81mg , atorvastatin 40mg , B12, thiamine, gabapentin, sertraline     PAST MEDICAL HISTORY: Past Medical History:  Diagnosis Date   Urinary retention     PAST SURGICAL HISTORY: Past Surgical History:  Procedure Laterality Date   PROSTATE BIOPSY  1990    MEDICATIONS: Current Outpatient Medications on File Prior to Visit  Medication Sig Dispense Refill   acetaminophen (TYLENOL) 325 MG tablet Take 2 tablets (650 mg total) by mouth every 6 (six) hours as needed.     aspirin 81 MG chewable tablet Chew 1 tablet (81 mg total) by mouth daily. 14 tablet 0   atorvastatin (LIPITOR) 40 MG tablet Take 1 tablet (40 mg total) by mouth at bedtime.     cyanocobalamin 1000  MCG tablet Take 1 tablet (1,000 mcg total) by mouth daily for Vitamin Supplement. 14 tablet 0   docusate sodium (COLACE) 100 MG capsule Take 1 capsule (100 mg total) by mouth daily for constipation. 14 capsule 0   finasteride (PROSCAR) 5 MG tablet Take 1 tablet (5 mg total) by mouth daily.     folic acid (FOLVITE) 1 MG tablet Take 1 tablet (1 mg total) by mouth daily for supplement. 14 tablet 0   gabapentin (NEURONTIN) 100 MG capsule Take 1 capsule (100 mg total) by mouth 3 (three) times daily for neuropathy. 42 capsule 0   Multiple Vitamin (MULTIVITAMIN WITH MINERALS) TABS tablet Take 1 tablet by mouth daily.     nicotine (NICODERM CQ - DOSED IN MG/24 HOURS) 21 mg/24hr patch Place 1 patch (21 mg total) onto the skin daily. 28 patch 0   polyethylene glycol (MIRALAX / GLYCOLAX) 17 g packet Take 17 g by mouth daily as needed for mild constipation. 14 each 0   senna (SENOKOT) 8.6 MG TABS tablet Take 2 tablets (17.2 mg total) by mouth daily. 120 tablet 0   sertraline (ZOLOFT) 25 MG tablet Take 1 tablet (25 mg total) by mouth in the morning for depression. 14 tablet 0   sulfamethoxazole-trimethoprim (BACTRIM DS) 800-160 MG tablet Take 1 tablet by mouth 2 (two) times daily for 7 days. 14 tablet 0   tamsulosin (FLOMAX) 0.4 MG CAPS capsule Take 1 capsule (0.4 mg total) by mouth daily. 14 capsule 0   thiamine 100 MG tablet Take 1 tablet (100 mg total) by mouth  daily.     No current facility-administered medications on file prior to visit.    ALLERGIES: No Known Allergies  FAMILY HISTORY: Family History  Family history unknown: Yes    Objective:  *** General: No acute distress.  Patient appears well-groomed.   Head:  Normocephalic/atraumatic Eyes:  fundi examined but not visualized Neck: supple, no paraspinal tenderness, full range of motion Back: No paraspinal tenderness Heart: regular rate and rhythm Lungs: Clear to auscultation bilaterally. Vascular: No carotid bruits. Neurological  Exam: Mental status: alert and oriented to person, place, and time, speech fluent and not dysarthric, language intact. Cranial nerves: CN I: not tested CN II: pupils equal, round and reactive to light, visual fields intact CN III, IV, VI:  full range of motion, no nystagmus, no ptosis CN V: facial sensation intact. CN VII: upper and lower face symmetric CN VIII: hearing intact CN IX, X: gag intact, uvula midline CN XI: sternocleidomastoid and trapezius muscles intact CN XII: tongue midline Bulk & Tone: normal, no fasciculations. Motor:  muscle strength 5/5 throughout Sensation:  Pinprick, temperature and vibratory sensation intact. Deep Tendon Reflexes:  2+ throughout,  toes downgoing.   Finger to nose testing:  Without dysmetria.   Heel to shin:  Without dysmetria.   Gait:  Normal station and stride.  Romberg negative.    Thank you for allowing me to take part in the care of this patient.  Shon Millet, DO  CC: Janeece Agee, NP

## 2021-08-02 NOTE — Telephone Encounter (Signed)
Post ED Visit - Positive Culture Follow-up: Successful Patient Follow-Up  Culture assessed and recommendations reviewed by:  []  , Pharm.D. []  Enzo Bi, Pharm.D., BCPS AQ-ID []  , Pharm.D., BCPS [x]  Celedonio Miyamoto, Pharm.D., BCPS []  [place], Garvin Fila.D., BCPS, AAHIVP []  , Pharm.D., BCPS, AAHIVP []  Georgina Pillion, PharmD, BCPS []  , PharmD, BCPS []  [place], PharmD, BCPS []  [place], PharmD  Positive urine culture  []  Patient discharged without antimicrobial prescription and treatment is now indicated []  Organism is resistant to prescribed ED discharge antimicrobial []  Patient with positive blood cultures  Changes discussed with ED provider: New antibiotic prescription symptom check, if improved/resolved, D/C bactrim, if still symptomatic, continue bactrim and add fosfomycin 3 grams po x 1 dose Spoke with son who states patient is better and has appt to see MD tomorrow, states will continue the bactrim and speak with MD about it     Estella Husk 08/02/2021, 10:30 AM

## 2021-08-03 ENCOUNTER — Ambulatory Visit: Payer: Medicare Other | Admitting: Neurology

## 2021-08-03 DIAGNOSIS — Z029 Encounter for administrative examinations, unspecified: Secondary | ICD-10-CM

## 2021-08-25 ENCOUNTER — Other Ambulatory Visit (HOSPITAL_COMMUNITY): Payer: Self-pay

## 2021-08-25 MED ORDER — PRAVASTATIN SODIUM 20 MG PO TABS
20.0000 mg | ORAL_TABLET | Freq: Every day | ORAL | 0 refills | Status: DC
  Filled 2021-08-25: qty 90, 90d supply, fill #0

## 2021-08-27 ENCOUNTER — Other Ambulatory Visit (HOSPITAL_COMMUNITY): Payer: Self-pay

## 2021-09-02 ENCOUNTER — Ambulatory Visit (INDEPENDENT_AMBULATORY_CARE_PROVIDER_SITE_OTHER): Payer: Medicare Other | Admitting: Podiatry

## 2021-09-02 DIAGNOSIS — M79674 Pain in right toe(s): Secondary | ICD-10-CM | POA: Diagnosis not present

## 2021-09-02 DIAGNOSIS — B351 Tinea unguium: Secondary | ICD-10-CM

## 2021-09-02 DIAGNOSIS — M79675 Pain in left toe(s): Secondary | ICD-10-CM | POA: Diagnosis not present

## 2021-09-05 NOTE — Progress Notes (Signed)
  Subjective:  Patient ID: Edgar TEUSCHER Sr., male    DOB: 1935/05/20,  MRN: [id number]  Chief Complaint  Patient presents with   Nail Problem    Thick painful toenails, 3 month follow up     86 y.o. male presents with the above complaint. History confirmed with patient.  Nails are thickened elongated and causing quite a bit of pain  Objective:  Physical Exam: warm, good capillary refill, no trophic changes or ulcerative lesions, normal DP and PT pulses, and normal sensory exam. Left Foot: dystrophic yellowed discolored nail plates with subungual debris Right Foot: dystrophic yellowed discolored nail plates with subungual debris  Assessment:   1. Pain due to onychomycosis of toenails of both feet      Plan:  Patient was evaluated and treated and all questions answered.  Discussed the etiology and treatment options for the condition in detail with the patient. Educated patient on the topical and oral treatment options for mycotic nails. Recommended debridement of the nails today. Sharp and mechanical debridement performed of all painful and mycotic nails today. Nails debrided in length and thickness using a nail nipper to level of comfort. Discussed treatment options including appropriate shoe gear. Follow up as needed for painful nails.    Return in about 3 months (around 12/03/2021) for painful thick nails.

## 2021-10-09 ENCOUNTER — Encounter (HOSPITAL_COMMUNITY): Payer: Self-pay

## 2021-10-09 ENCOUNTER — Emergency Department (HOSPITAL_COMMUNITY)
Admission: EM | Admit: 2021-10-09 | Discharge: 2021-10-09 | Disposition: A | Discharge status: Home / Self Care | Payer: Medicare Other | Attending: Emergency Medicine | Admitting: Emergency Medicine

## 2021-10-09 DIAGNOSIS — Z7982 Long term (current) use of aspirin: Secondary | ICD-10-CM | POA: Insufficient documentation

## 2021-10-09 DIAGNOSIS — Z79899 Other long term (current) drug therapy: Secondary | ICD-10-CM | POA: Diagnosis not present

## 2021-10-09 DIAGNOSIS — T83091A Other mechanical complication of indwelling urethral catheter, initial encounter: Secondary | ICD-10-CM | POA: Insufficient documentation

## 2021-10-09 DIAGNOSIS — T839XXA Unspecified complication of genitourinary prosthetic device, implant and graft, initial encounter: Secondary | ICD-10-CM

## 2021-10-09 DIAGNOSIS — Y732 Prosthetic and other implants, materials and accessory gastroenterology and urology devices associated with adverse incidents: Secondary | ICD-10-CM | POA: Insufficient documentation

## 2021-10-09 NOTE — ED Triage Notes (Signed)
Patient c/o foley cath leaking at times. Patient states he woke this AM and had the diaper soaked.

## 2021-10-09 NOTE — Discharge Instructions (Signed)
Follow-up with your urologist for ongoing management of your chronic urinary retention and Foley catheter.  Return to the emergency department for further concerns.

## 2021-10-09 NOTE — ED Notes (Signed)
Leg bag applied

## 2021-10-09 NOTE — ED Provider Notes (Signed)
[place] [facility] DEPT Provider Note   CSN: [id number] Arrival date & time: 10/09/21  4098     History  No chief complaint on file.   Edgar Dan Bertram Sr. is a 86 y.o. male.  HPI Patient presents for concern of leakage around his Foley catheter.  Medical history includes urinary retention, TIA.  He reports that his Foley catheter was last changed on the first of the month.  He reports that he awoke this morning with 500 cc in his urine bag.  He emptied this prior to arrival.  He denies any discomfort but has experienced some leakage into his diaper.  He has also noticed increased amount of sediment in his Foley catheter tubing.  He has no other concerns.    Home Medications Prior to Admission medications   Medication Sig Start Date End Date Taking? Authorizing Provider  acetaminophen (TYLENOL) 325 MG tablet Take 2 tablets (650 mg total) by mouth every 6 (six) hours as needed. 12/05/20   Sharion Settler, DO  aspirin 81 MG chewable tablet Chew 1 tablet (81 mg total) by mouth daily. 03/10/21   Lavone Neri, AGNP-C  atorvastatin (LIPITOR) 40 MG tablet Take 1 tablet (40 mg total) by mouth at bedtime. 12/08/20   Ezequiel Essex, MD  cyanocobalamin 1000 MCG tablet Take 1 tablet (1,000 mcg total) by mouth daily for Vitamin Supplement. 03/10/21   Lavone Neri, AGNP-C  docusate sodium (COLACE) 100 MG capsule Take 1 capsule (100 mg total) by mouth daily for constipation. 03/10/21   Lavone Neri, AGNP-C  finasteride (PROSCAR) 5 MG tablet Take 1 tablet (5 mg total) by mouth daily. 12/05/20   Sharion Settler, DO  folic acid (FOLVITE) 1 MG tablet Take 1 tablet (1 mg total) by mouth daily for supplement. 03/10/21   Lavone Neri, AGNP-C  gabapentin (NEURONTIN) 100 MG capsule Take 1 capsule (100 mg total) by mouth 3 (three) times daily for neuropathy. 03/10/21   Lavone Neri, AGNP-C  Multiple Vitamin (MULTIVITAMIN WITH MINERALS) TABS tablet Take 1 tablet by mouth  daily. 12/05/20   Sharion Settler, DO  nicotine (NICODERM CQ - DOSED IN MG/24 HOURS) 21 mg/24hr patch Place 1 patch (21 mg total) onto the skin daily. 12/05/20   Sharion Settler, DO  polyethylene glycol (MIRALAX / GLYCOLAX) 17 g packet Take 17 g by mouth daily as needed for mild constipation. 12/05/20   Sharion Settler, DO  pravastatin (PRAVACHOL) 20 MG tablet Take 1 tablet (20 mg total) by mouth daily. 08/25/21     senna (SENOKOT) 8.6 MG TABS tablet Take 2 tablets (17.2 mg total) by mouth daily. 12/07/20   Merrily Brittle, DO  sertraline (ZOLOFT) 25 MG tablet Take 1 tablet (25 mg total) by mouth in the morning for depression. 03/10/21   Lavone Neri, AGNP-C  tamsulosin (FLOMAX) 0.4 MG CAPS capsule Take 1 capsule (0.4 mg total) by mouth daily. 03/10/21   Lavone Neri, AGNP-C  thiamine 100 MG tablet Take 1 tablet (100 mg total) by mouth daily. 12/05/20   Sharion Settler, DO      Allergies    Patient has no known allergies.    Review of Systems   Review of Systems  Genitourinary:        Urine leakage around Foley catheter.  All other systems reviewed and are negative.   Physical Exam Updated Vital Signs BP (!) 146/69 (BP Location: Right Arm)   Pulse 65   Temp (!) 97.3 F (36.3 C) (Oral)   Resp 16  Ht 5\' 9"  (1.753 m)   Wt 99.8 kg   SpO2 100%   BMI 32.49 kg/m  Physical Exam Vitals and nursing note reviewed.  Constitutional:      General: He is not in acute distress.    Appearance: Normal appearance. He is well-developed. He is not ill-appearing, toxic-appearing or diaphoretic.  HENT:     Head: Normocephalic and atraumatic.     Right Ear: External ear normal.     Left Ear: External ear normal.     Nose: Nose normal.     Mouth/Throat:     Mouth: Mucous membranes are moist.     Pharynx: Oropharynx is clear.  Eyes:     Extraocular Movements: Extraocular movements intact.     Conjunctiva/sclera: Conjunctivae normal.  Cardiovascular:     Rate and Rhythm:  Normal rate and regular rhythm.  Pulmonary:     Effort: Pulmonary effort is normal. No respiratory distress.  Abdominal:     General: There is no distension.     Palpations: Abdomen is soft.     Tenderness: There is no abdominal tenderness.  Genitourinary:    Comments: Foley catheter in place.  100 cc of urine in Foley bag. 60 cc of volume in bladder on bedside ultrasound. Musculoskeletal:        General: No swelling. Normal range of motion.     Cervical back: Normal range of motion and neck supple.     Right lower leg: No edema.     Left lower leg: No edema.  Skin:    General: Skin is warm and dry.     Coloration: Skin is not jaundiced or pale.  Neurological:     General: No focal deficit present.     Mental Status: He is alert and oriented to person, place, and time.     Cranial Nerves: No cranial nerve deficit.     Sensory: No sensory deficit.     Motor: No weakness.     Coordination: Coordination normal.  Psychiatric:        Mood and Affect: Mood normal.        Behavior: Behavior normal.        Thought Content: Thought content normal.        Judgment: Judgment normal.     ED Results / Procedures / Treatments   Labs (all labs ordered are listed, but only abnormal results are displayed) Labs Reviewed - No data to display  EKG None  Radiology No results found.  Procedures Procedures    Medications Ordered in ED Medications - No data to display  ED Course/ Medical Decision Making/ A&P                           Medical Decision Making  Patient presents for leakage of urine around his Foley catheter.  Onset of this was this morning.  He has a chronic indwelling Foley due to urinary retention.  He reports that Foley catheter was last changed 30 days ago.  He is typically scheduled to undergo Foley change every month.  Patient is well-appearing on arrival.  Vital signs are unremarkable.  He has no suprapubic discomfort.  There does appear to be a small volume of  fluid in his bladder when assessed on bedside ultrasound.  This is estimated be 60 cc.  There is sediment in the Foley tube and Foley bag.  Patient underwent Foley catheter replacement in the ED.  He was  advised to follow-up with his urologist.  He was discharged in good condition.        Final Clinical Impression(s) / ED Diagnoses Final diagnoses:  Problem with Foley catheter, initial encounter [facility])    Rx / DC Orders ED Discharge Orders     None         Godfrey Pick, MD 10/09/21 1042

## 2021-10-13 ENCOUNTER — Telehealth: Payer: Self-pay

## 2021-10-13 NOTE — Telephone Encounter (Signed)
     Patient  visit on 9/30  at [facility] long  Have you been able to follow up with your primary care physician? yes  The patient was or was not able to obtain any needed medicine or equipment. yes  Are there diet recommendations that you are having difficulty following?na  Patient expresses understanding of discharge instructions and education provided has no other needs at this time. yes     [place], Care Management  [phone] [address], [place], [place] [address] Phone: [phone] Email: [email or web address]

## 2021-10-13 NOTE — Telephone Encounter (Signed)
Please disregard- opened in error

## 2021-11-25 ENCOUNTER — Other Ambulatory Visit (HOSPITAL_COMMUNITY): Payer: Self-pay

## 2021-11-29 ENCOUNTER — Other Ambulatory Visit (HOSPITAL_COMMUNITY): Payer: Self-pay

## 2021-11-29 MED ORDER — PRAVASTATIN SODIUM 20 MG PO TABS
20.0000 mg | ORAL_TABLET | Freq: Every day | ORAL | 0 refills | Status: DC
  Filled 2021-11-29: qty 90, 90d supply, fill #0

## 2021-12-09 ENCOUNTER — Ambulatory Visit (INDEPENDENT_AMBULATORY_CARE_PROVIDER_SITE_OTHER): Payer: Medicare Other | Admitting: Podiatry

## 2021-12-09 DIAGNOSIS — B351 Tinea unguium: Secondary | ICD-10-CM

## 2021-12-09 DIAGNOSIS — M79674 Pain in right toe(s): Secondary | ICD-10-CM

## 2021-12-09 DIAGNOSIS — M79675 Pain in left toe(s): Secondary | ICD-10-CM

## 2021-12-09 NOTE — Progress Notes (Signed)
  Subjective:  Patient ID: Edgar LATOUCHE Sr., male    DOB: September 29, 1935,  MRN: [id number]  Chief Complaint  Patient presents with   Nail Problem    Thick painful toenails, 3 month follow up    86 y.o. male presents with the above complaint. History confirmed with patient.  Nails become thickened and elongated again, the last debridement was helpful and he would like to continue this plan  Objective:  Physical Exam: warm, good capillary refill, no trophic changes or ulcerative lesions, normal DP and PT pulses, and normal sensory exam. Left Foot: dystrophic yellowed discolored nail plates with subungual debris Right Foot: dystrophic yellowed discolored nail plates with subungual debris  Assessment:   1. Pain due to onychomycosis of toenails of both feet      Plan:  Patient was evaluated and treated and all questions answered.  Discussed the etiology and treatment options for the condition in detail with the patient. Educated patient on the topical and oral treatment options for mycotic nails. Recommended debridement of the nails today. Sharp and mechanical debridement performed of all painful and mycotic nails today. Nails debrided in length and thickness using a nail nipper to level of comfort. Discussed treatment options including appropriate shoe gear. Follow up as needed for painful nails.    Return in about 3 months (around 03/10/2022) for painful nails .

## 2022-01-04 ENCOUNTER — Encounter: Payer: Self-pay | Admitting: Neurology

## 2022-01-04 ENCOUNTER — Ambulatory Visit: Payer: Medicare Other | Admitting: Neurology

## 2022-01-04 NOTE — Progress Notes (Deleted)
NEUROLOGY CONSULTATION NOTE  Edgar Mckay. MRN: [id number] DOB: [birth year]  Referring provider: Janeece Agee, NP Primary care provider: ***  Reason for consult:  TIA  Assessment/Plan:   ***   Subjective:  Edgar Mckay is an 86 year old male with history BPH and paraphimosis s/p reduction with urinary retention with indwelling catheter who presents for TIA.  History is accompanied by his ***, ED and referring provider's notes.  In November 2022, he was admitted to [facility] hospital with obstructed indwelling catheter and UTI.  He had not seen a PCP for many years and was ***.  He was lethargic with difficulty speaking and generalized weakness lasting 20 minutes.  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  Echo showed EF 55-60%.  Hgb A1c 5.6 and LDL 88.  Started on ASA 81mg  and atorvastatin.  TSH was normal at 3.043.  B12 level was low at 104.  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        PAST MEDICAL HISTORY: Past Medical History:  Diagnosis Date   Urinary retention     PAST SURGICAL HISTORY: Past Surgical History:  Procedure Laterality Date   PROSTATE BIOPSY  1990    MEDICATIONS: Current Outpatient Medications on File Prior to Visit  Medication Sig Dispense Refill   acetaminophen (TYLENOL) 325 MG tablet Take 2 tablets (650 mg total) by mouth every 6 (six) hours as needed.     aspirin 81 MG chewable tablet Chew 1 tablet (81 mg total) by mouth daily. 14 tablet 0   atorvastatin (LIPITOR) 40 MG tablet Take 1 tablet (40 mg total) by mouth at bedtime.     cyanocobalamin 1000 MCG tablet Take 1 tablet (1,000 mcg total) by mouth daily for Vitamin  Supplement. 14 tablet 0   docusate sodium (COLACE) 100 MG capsule Take 1 capsule (100 mg total) by mouth daily for constipation. 14 capsule 0   finasteride (PROSCAR) 5 MG tablet Take 1 tablet (5 mg total) by mouth daily.     folic acid (FOLVITE) 1 MG tablet Take 1 tablet (1 mg total) by mouth daily for supplement. 14 tablet 0   gabapentin (NEURONTIN) 100 MG capsule Take 1 capsule (100 mg total) by mouth 3 (three) times daily for neuropathy. 42 capsule 0   Multiple Vitamin (MULTIVITAMIN WITH MINERALS) TABS tablet Take 1 tablet by mouth daily.     nicotine (NICODERM CQ - DOSED IN MG/24 HOURS) 21 mg/24hr patch Place 1 patch (21 mg total) onto the skin daily. 28 patch 0   polyethylene glycol (MIRALAX / GLYCOLAX) 17 g packet Take 17 g by mouth daily as needed for mild constipation. 14 each 0   pravastatin (PRAVACHOL) 20 MG tablet Take 1 tablet (20 mg total) by mouth daily. 90 tablet 0   senna (SENOKOT) 8.6 MG TABS tablet Take 2 tablets (17.2 mg total) by mouth daily. 120 tablet 0   sertraline (ZOLOFT) 25 MG tablet Take 1 tablet (25 mg total) by mouth in the morning for depression. 14 tablet 0   tamsulosin (FLOMAX) 0.4 MG CAPS capsule Take 1 capsule (0.4 mg total) by mouth daily. 14 capsule 0   thiamine 100 MG tablet Take 1 tablet (100 mg  total) by mouth daily.     No current facility-administered medications on file prior to visit.    ALLERGIES: No Known Allergies  FAMILY HISTORY: Family History  Family history unknown: Yes    Objective:  *** General: No acute distress.  Patient appears well-groomed.   Head:  Normocephalic/atraumatic Eyes:  fundi examined but not visualized Neck: supple, no paraspinal tenderness, full range of motion Back: No paraspinal tenderness Heart: regular rate and rhythm Lungs: Clear to auscultation bilaterally. Vascular: No carotid bruits. Neurological Exam: Mental status: alert and oriented to person, place, and time, speech fluent and not dysarthric, language  intact. Cranial nerves: CN I: not tested CN II: pupils equal, round and reactive to light, visual fields intact CN III, IV, VI:  full range of motion, no nystagmus, no ptosis CN V: facial sensation intact. CN VII: upper and lower face symmetric CN VIII: hearing intact CN IX, X: gag intact, uvula midline CN XI: sternocleidomastoid and trapezius muscles intact CN XII: tongue midline Bulk & Tone: normal, no fasciculations. Motor:  muscle strength 5/5 throughout Sensation:  Pinprick, temperature and vibratory sensation intact. Deep Tendon Reflexes:  2+ throughout,  toes downgoing.   Finger to nose testing:  Without dysmetria.   Heel to shin:  Without dysmetria.   Gait:  Normal station and stride.  Romberg negative.    Thank you for allowing me to take part in the care of this patient.  Shon Millet, DO  CC: ***

## 2022-01-22 ENCOUNTER — Other Ambulatory Visit (HOSPITAL_COMMUNITY): Payer: Self-pay

## 2022-01-22 ENCOUNTER — Emergency Department (HOSPITAL_COMMUNITY)
Admission: EM | Admit: 2022-01-22 | Discharge: 2022-01-22 | Disposition: A | Discharge status: Home / Self Care | Payer: Medicare Other | Attending: Emergency Medicine | Admitting: Emergency Medicine

## 2022-01-22 ENCOUNTER — Encounter (HOSPITAL_COMMUNITY): Payer: Self-pay | Admitting: Emergency Medicine

## 2022-01-22 DIAGNOSIS — R339 Retention of urine, unspecified: Secondary | ICD-10-CM | POA: Diagnosis not present

## 2022-01-22 DIAGNOSIS — T83098A Other mechanical complication of other indwelling urethral catheter, initial encounter: Secondary | ICD-10-CM | POA: Insufficient documentation

## 2022-01-22 DIAGNOSIS — T839XXA Unspecified complication of genitourinary prosthetic device, implant and graft, initial encounter: Secondary | ICD-10-CM

## 2022-01-22 DIAGNOSIS — Z7982 Long term (current) use of aspirin: Secondary | ICD-10-CM | POA: Insufficient documentation

## 2022-01-22 LAB — URINALYSIS, ROUTINE W REFLEX MICROSCOPIC
Bilirubin Urine: NEGATIVE
Glucose, UA: NEGATIVE mg/dL
Ketones, ur: NEGATIVE mg/dL
Nitrite: POSITIVE — AB
Protein, ur: NEGATIVE mg/dL
Specific Gravity, Urine: 1.017 (ref 1.005–1.030)
WBC, UA: >50 WBC/hpf — ABNORMAL HIGH (ref 0–5)
pH: 5 (ref 5.0–8.0)

## 2022-01-22 MED ORDER — SULFAMETHOXAZOLE-TRIMETHOPRIM 800-160 MG PO TABS
1.0000 | ORAL_TABLET | Freq: Two times a day (BID) | ORAL | 0 refills | Status: AC
  Filled 2022-01-22: qty 14, 7d supply, fill #0

## 2022-01-22 NOTE — Discharge Instructions (Signed)
Return to ED with any new symptoms such as fevers Please follow-up with urology team as previously scheduled Please begin taking Bactrim 2 times daily for the next 7 days

## 2022-01-22 NOTE — ED Triage Notes (Signed)
Pt presents for foley cath clogged. Last emptied his foley bag at 9pm. After that noticed that he was urinating around catheter.  Foley placed as part of tx for prostate cancer.  Denies abd pain, fever.

## 2022-01-22 NOTE — ED Provider Notes (Addendum)
[facility] DEPT Provider Note   CSN: [id number] Arrival date & time: 01/22/22  5885     History No chief complaint on file.   Edgar Dan Kleen Sr. is a 87 y.o. male with medical history of urinary retention.  Patient presents to the ED for evaluation of dysfunctional Foley catheter.  Patient reports that he gets his Foley catheter changed once a month.  Patient reports that he woke this morning and noted that urine was dribbling into his diaper and not into his Foley catheter.  The patient reports significant history of this.  Patient reports last night before that his Foley catheter bag was "half-full".  Patient denies any discomfort, fevers, nausea, vomiting, chills.  Patient denies any other concerns.  HPI     Home Medications Prior to Admission medications   Medication Sig Start Date End Date Taking? Authorizing Provider  sulfamethoxazole-trimethoprim (BACTRIM DS) 800-160 MG tablet Take 1 tablet by mouth 2 (two) times daily for 7 days. 01/22/22 01/29/22 Yes Azucena Cecil, PA-C  acetaminophen (TYLENOL) 325 MG tablet Take 2 tablets (650 mg total) by mouth every 6 (six) hours as needed. 12/05/20   Sharion Settler, DO  aspirin 81 MG chewable tablet Chew 1 tablet (81 mg total) by mouth daily. 03/10/21   Lavone Neri, AGNP-C  atorvastatin (LIPITOR) 40 MG tablet Take 1 tablet (40 mg total) by mouth at bedtime. 12/08/20   Ezequiel Essex, MD  cyanocobalamin 1000 MCG tablet Take 1 tablet (1,000 mcg total) by mouth daily for Vitamin Supplement. 03/10/21   Lavone Neri, AGNP-C  docusate sodium (COLACE) 100 MG capsule Take 1 capsule (100 mg total) by mouth daily for constipation. 03/10/21   Lavone Neri, AGNP-C  finasteride (PROSCAR) 5 MG tablet Take 1 tablet (5 mg total) by mouth daily. 12/05/20   Sharion Settler, DO  folic acid (FOLVITE) 1 MG tablet Take 1 tablet (1 mg total) by mouth daily for supplement. 03/10/21   Lavone Neri, AGNP-C   gabapentin (NEURONTIN) 100 MG capsule Take 1 capsule (100 mg total) by mouth 3 (three) times daily for neuropathy. 03/10/21   Lavone Neri, AGNP-C  Multiple Vitamin (MULTIVITAMIN WITH MINERALS) TABS tablet Take 1 tablet by mouth daily. 12/05/20   Sharion Settler, DO  nicotine (NICODERM CQ - DOSED IN MG/24 HOURS) 21 mg/24hr patch Place 1 patch (21 mg total) onto the skin daily. 12/05/20   Sharion Settler, DO  polyethylene glycol (MIRALAX / GLYCOLAX) 17 g packet Take 17 g by mouth daily as needed for mild constipation. 12/05/20   Sharion Settler, DO  pravastatin (PRAVACHOL) 20 MG tablet Take 1 tablet (20 mg total) by mouth daily. 11/28/21     senna (SENOKOT) 8.6 MG TABS tablet Take 2 tablets (17.2 mg total) by mouth daily. 12/07/20   Merrily Brittle, DO  sertraline (ZOLOFT) 25 MG tablet Take 1 tablet (25 mg total) by mouth in the morning for depression. 03/10/21   Lavone Neri, AGNP-C  tamsulosin (FLOMAX) 0.4 MG CAPS capsule Take 1 capsule (0.4 mg total) by mouth daily. 03/10/21   Lavone Neri, AGNP-C  thiamine 100 MG tablet Take 1 tablet (100 mg total) by mouth daily. 12/05/20   Sharion Settler, DO      Allergies    Patient has no known allergies.    Review of Systems   Review of Systems  Constitutional:  Negative for chills and fever.  Gastrointestinal:  Negative for abdominal pain, nausea and vomiting.  Genitourinary:  Positive for difficulty urinating.  All  other systems reviewed and are negative.   Physical Exam Updated Vital Signs BP 131/73 (BP Location: Right Arm)   Pulse 73   Temp 98.2 F (36.8 C) (Oral)   Resp 18   Wt 99 kg   SpO2 97%   BMI 32.23 kg/m  Physical Exam Vitals and nursing note reviewed.  Constitutional:      General: He is not in acute distress.    Appearance: Normal appearance. He is not ill-appearing, toxic-appearing or diaphoretic.  HENT:     Head: Normocephalic and atraumatic.     Nose: Nose normal. No congestion.      Mouth/Throat:     Mouth: Mucous membranes are moist.     Pharynx: Oropharynx is clear.  Eyes:     Extraocular Movements: Extraocular movements intact.     Conjunctiva/sclera: Conjunctivae normal.     Pupils: Pupils are equal, round, and reactive to light.  Cardiovascular:     Rate and Rhythm: Normal rate and regular rhythm.  Pulmonary:     Effort: Pulmonary effort is normal.     Breath sounds: Normal breath sounds. No wheezing.  Abdominal:     General: Abdomen is flat.     Tenderness: There is no abdominal tenderness.     Comments: No suprapubic tenderness  Genitourinary:    Comments: Foley catheter in place Skin:    General: Skin is warm and dry.     Capillary Refill: Capillary refill takes less than 2 seconds.  Neurological:     Mental Status: He is alert and oriented to person, place, and time.     ED Results / Procedures / Treatments   Labs (all labs ordered are listed, but only abnormal results are displayed) Labs Reviewed  URINALYSIS, ROUTINE W REFLEX MICROSCOPIC - Abnormal; Notable for the following components:      Result Value   APPearance HAZY (*)    Hgb urine dipstick SMALL (*)    Nitrite POSITIVE (*)    Leukocytes,Ua LARGE (*)    WBC, UA >50 (*)    Bacteria, UA MANY (*)    All other components within normal limits  URINE CULTURE    EKG None  Radiology No results found.  Procedures Procedures   Medications Ordered in ED Medications - No data to display  ED Course/ Medical Decision Making/ A&P                           Medical Decision Making Amount and/or Complexity of Data Reviewed Labs: ordered.  Risk Prescription drug management.   87 year old male presents to ED for evaluation of Foley catheter dysfunction.  Please see HPI for further details.  On examination the patient is afebrile and nontachycardic.  The patient was sounds are clear bilaterally, he is not hypoxic.  The patient abdomen is soft and compressible throughout.  The  patient Foley catheter is in place and there is no appreciable urine in his Foley catheter bag.  The patient has no suprapubic abdominal tenderness.  Patient in no apparent distress.  Patient had Foley catheter replaced with spontaneous return of urine into bag.  Patient reports that he has no other complaints at this time.  Urinalysis was collected which did show nitrite positive urine.  Patient will be placed on Bactrim as on chart review he was shown to be sensitive to this medication.  The patient will have his urine cultured.  The patient will be instructed to follow-up with his  PCP/urology team.  The patient was given strict return precautions and he voiced understanding.  Patient stable for discharge.  Final Clinical Impression(s) / ED Diagnoses Final diagnoses:  Problem with Foley catheter, initial encounter [facility])    Rx / DC Orders ED Discharge Orders          Ordered    sulfamethoxazole-trimethoprim (BACTRIM DS) 800-160 MG tablet  2 times daily        01/22/22 0755              Azucena Cecil, PA-C 01/22/22 0755    Azucena Cecil, PA-C 01/22/22 9371    Blanchie Dessert, MD 01/22/22 [phone]

## 2022-01-24 LAB — URINE CULTURE: Culture: 90000 — AB

## 2022-01-25 ENCOUNTER — Telehealth (HOSPITAL_BASED_OUTPATIENT_CLINIC_OR_DEPARTMENT_OTHER): Payer: Self-pay | Admitting: Emergency Medicine

## 2022-01-25 NOTE — Progress Notes (Signed)
ED Antimicrobial Stewardship Positive Culture Follow Up   Edgar Mckay Sr. is an 87 y.o. male who presented to [facility] on 01/22/2022 with a chief complaint of a clogged urinary catheter   Recent Results (from the past 720 hour(s))  Urine Culture     Status: Abnormal   Collection Time: 01/22/22  7:09 AM   Specimen: Urine, Catheterized  Result Value Ref Range Status   Specimen Description   Final    URINE, CATHETERIZED Performed at [facility] [address], [place], [place] [address]    Special Requests   Final    NONE Performed at [facility], [place] [address], [place], [place] [address]    Culture (A)  Final    90,000 COLONIES/mL ESCHERICHIA COLI Confirmed Extended Spectrum Beta-Lactamase Producer (ESBL).  In bloodstream infections from ESBL organisms, carbapenems are preferred over piperacillin/tazobactam. They are shown to have a lower risk of mortality.    Report Status 01/24/2022 FINAL  Final   Organism ID, Bacteria ESCHERICHIA COLI (A)  Final      Susceptibility   Escherichia coli - MIC*    AMPICILLIN >=32 RESISTANT Resistant     CEFAZOLIN >=64 RESISTANT Resistant     CEFEPIME 8 INTERMEDIATE Intermediate     CEFTRIAXONE >=64 RESISTANT Resistant     CIPROFLOXACIN >=4 RESISTANT Resistant     GENTAMICIN <=1 SENSITIVE Sensitive     IMIPENEM <=0.25 SENSITIVE Sensitive     NITROFURANTOIN 32 SENSITIVE Sensitive     TRIMETH/SULFA >=320 RESISTANT Resistant     AMPICILLIN/SULBACTAM 16 INTERMEDIATE Intermediate     PIP/TAZO <=4 SENSITIVE Sensitive     * 90,000 COLONIES/mL ESCHERICHIA COLI   Patient complaining of a clogged foley catheter.  No abdominal pain or fever noted.  Catheter replaced in the ED with spontaneous return of urine into the bag.  UA abnormal as expected.  Urine culture with resistant Ecoli.  Suspect colonization given no infectious symptoms.  Plan:  stop Bactrim - no further treatment needed   ED Provider: Isla Pence, MD   Candie Mile 01/25/2022, 8:10 AM Clinical Pharmacist Monday - Friday phone -  [phone] Saturday - Sunday phone - [phone]

## 2022-01-25 NOTE — Telephone Encounter (Signed)
Post ED Visit - Positive Culture Follow-up: Successful Patient Follow-Up  Culture assessed and recommendations reviewed by:  []  Elenor Quinones, Pharm.D. [x]  Heide Guile, Pharm.D., BCPS AQ-ID []  Parks Neptune, Pharm.D., BCPS []  Alycia Rossetti, Pharm.D., BCPS []  [place], Pharm.D., BCPS, AAHIVP []  Legrand Como, Pharm.D., BCPS, AAHIVP []  Salome Arnt, PharmD, BCPS []  Johnnette Gourd, PharmD, BCPS []  Hughes Better, PharmD, BCPS []  Leeroy Cha, PharmD  Positive urine culture  []  Patient discharged without antimicrobial prescription and treatment is now indicated []  Organism is resistant to prescribed ED discharge antimicrobial []  Patient with positive blood cultures  Changes discussed with ED provider: Dr Gilford Raid New antibiotic prescription: stop bactrim  Attempting to contact patient   Hazle Nordmann 01/25/2022, 11:28 AM

## 2022-01-27 ENCOUNTER — Emergency Department (HOSPITAL_COMMUNITY)
Admission: EM | Admit: 2022-01-27 | Discharge: 2022-01-27 | Disposition: A | Discharge status: Home / Self Care | Payer: Medicare Other | Attending: Emergency Medicine | Admitting: Emergency Medicine

## 2022-01-27 DIAGNOSIS — Y732 Prosthetic and other implants, materials and accessory gastroenterology and urology devices associated with adverse incidents: Secondary | ICD-10-CM | POA: Insufficient documentation

## 2022-01-27 DIAGNOSIS — T839XXA Unspecified complication of genitourinary prosthetic device, implant and graft, initial encounter: Secondary | ICD-10-CM

## 2022-01-27 DIAGNOSIS — T83031A Leakage of indwelling urethral catheter, initial encounter: Secondary | ICD-10-CM | POA: Diagnosis not present

## 2022-01-27 NOTE — Discharge Instructions (Signed)
Replace her Foley catheter with a larger catheter today to stop the leakage.  It is in place and draining well.  Make sure you finish your antibiotics and follow-up with the urologist.

## 2022-01-27 NOTE — ED Triage Notes (Signed)
Pt c/o foley catheter leaking from his penis. Recently seen for similar issues and had catheter changed. Pt compliant with abx for UTI

## 2022-01-27 NOTE — ED Provider Notes (Signed)
[facility] DEPT Provider Note   CSN: [id number] Arrival date & time: 01/27/22  1714     History  Chief Complaint  Patient presents with   Foley catheter problem    Edgar Edgar Mckay Sr. is a 87 y.o. male.  He has history of urinary retention, presents the ER for leaking around his Foley catheter, this is replaced 5 days ago and he has been antibiotics for UTI at that time.  He states he is having urine leakage around Foley catheter at his penis.  Denies fevers chills abdominal pain or other complaints.  HPI     Home Medications Prior to Admission medications   Medication Sig Start Date End Date Taking? Authorizing Provider  acetaminophen (TYLENOL) 325 MG tablet Take 2 tablets (650 mg total) by mouth every 6 (six) hours as needed. 12/05/20   Sharion Settler, DO  aspirin 81 MG chewable tablet Chew 1 tablet (81 mg total) by mouth daily. 03/10/21   Lavone Neri, AGNP-C  atorvastatin (LIPITOR) 40 MG tablet Take 1 tablet (40 mg total) by mouth at bedtime. 12/08/20   Ezequiel Essex, MD  cyanocobalamin 1000 MCG tablet Take 1 tablet (1,000 mcg total) by mouth daily for Vitamin Supplement. 03/10/21   Lavone Neri, AGNP-C  docusate sodium (COLACE) 100 MG capsule Take 1 capsule (100 mg total) by mouth daily for constipation. 03/10/21   Lavone Neri, AGNP-C  finasteride (PROSCAR) 5 MG tablet Take 1 tablet (5 mg total) by mouth daily. 12/05/20   Sharion Settler, DO  folic acid (FOLVITE) 1 MG tablet Take 1 tablet (1 mg total) by mouth daily for supplement. 03/10/21   Lavone Neri, AGNP-C  gabapentin (NEURONTIN) 100 MG capsule Take 1 capsule (100 mg total) by mouth 3 (three) times daily for neuropathy. 03/10/21   Lavone Neri, AGNP-C  Multiple Vitamin (MULTIVITAMIN WITH MINERALS) TABS tablet Take 1 tablet by mouth daily. 12/05/20   Sharion Settler, DO  nicotine (NICODERM CQ - DOSED IN MG/24 HOURS) 21 mg/24hr patch Place 1 patch (21 mg total) onto  the skin daily. 12/05/20   Sharion Settler, DO  polyethylene glycol (MIRALAX / GLYCOLAX) 17 g packet Take 17 g by mouth daily as needed for mild constipation. 12/05/20   Sharion Settler, DO  pravastatin (PRAVACHOL) 20 MG tablet Take 1 tablet (20 mg total) by mouth daily. 11/28/21     senna (SENOKOT) 8.6 MG TABS tablet Take 2 tablets (17.2 mg total) by mouth daily. 12/07/20   Merrily Brittle, DO  sertraline (ZOLOFT) 25 MG tablet Take 1 tablet (25 mg total) by mouth in the morning for depression. 03/10/21   Lavone Neri, AGNP-C  sulfamethoxazole-trimethoprim (BACTRIM DS) 800-160 MG tablet Take 1 tablet by mouth 2 (two) times daily for 7 days. 01/22/22 01/29/22  Azucena Cecil, PA-C  tamsulosin (FLOMAX) 0.4 MG CAPS capsule Take 1 capsule (0.4 mg total) by mouth daily. 03/10/21   Lavone Neri, AGNP-C  thiamine 100 MG tablet Take 1 tablet (100 mg total) by mouth daily. 12/05/20   Sharion Settler, DO      Allergies    Patient has no known allergies.    Review of Systems   Review of Systems  Genitourinary:  Negative for flank pain, frequency, genital sores and penile pain.    Physical Exam Updated Vital Signs BP (!) 141/65 (BP Location: Right Arm)   Pulse 61   Temp 99 F (37.2 C) (Oral)   Resp 18   SpO2 100%  Physical Exam Vitals and nursing  note reviewed. Exam conducted with a chaperone present.  Constitutional:      General: He is not in acute distress.    Appearance: He is well-developed.  HENT:     Head: Normocephalic and atraumatic.  Eyes:     Conjunctiva/sclera: Conjunctivae normal.  Cardiovascular:     Rate and Rhythm: Normal rate and regular rhythm.     Heart sounds: No murmur heard. Pulmonary:     Effort: Pulmonary effort is normal. No respiratory distress.     Breath sounds: Normal breath sounds.  Abdominal:     Palpations: Abdomen is soft.     Tenderness: There is no abdominal tenderness.  Genitourinary:    Penis: Normal.      Comments: Leakage of  urine around the Foley catheter Musculoskeletal:        General: No swelling.     Cervical back: Neck supple.  Skin:    General: Skin is warm and dry.     Capillary Refill: Capillary refill takes less than 2 seconds.  Neurological:     Mental Status: He is alert.  Psychiatric:        Mood and Affect: Mood normal.     ED Results / Procedures / Treatments   Labs (all labs ordered are listed, but only abnormal results are displayed) Labs Reviewed - No data to display  EKG None  Radiology No results found.  Procedures Procedures    Medications Ordered in ED Medications - No data to display  ED Course/ Medical Decision Making/ A&P                             Medical Decision Making This patient presents to the ED for concern of being around Foley catheter, this involves an extensive number of treatment options, and is a complaint that carries with it a high risk of complications and morbidity.  The differential diagnosis includes ankle dysfunctional Foley catheter, UTI, other   Co morbidities that complicate the patient evaluation  BPH   Additional history obtained:  Additional history obtained from EMR External records from outside source obtained and reviewed including outpatient family medicine visit for cystitis        Problem List / ED Course / Critical interventions / Medication management  Leaking of Foley catheter, patient had 16 French Foley catheter in place, is on Bactrim for UTI, states he is having leakage of urine around the catheter at the level of his penis.  There is no erythema or swelling of the glans over the foreskin, discussed with patient a allergic Foley can help prevent this.  Foley catheter was replaced he was agreeable with this.  He did have some discomfort after Foley placement.  I used bedside ultrasound and confirmed the Foley bulb within the bladder, discussed with patient this is likely some mild discomfort due to the Foley  displacement.  He is already on antibiotics for UTI, there is no signs of traumatic placement, his urine is draining well and is clear.  He is already on antibiotics for UTI and has no UTI symptoms, no utility for urinalysis, advised on urology follow-up and return precautions.     Test / Admission - Considered:  Urinalysis but patient is already on antibiotics and having no symptoms, no indication for this at this time.             Final Clinical Impression(s) / ED Diagnoses Final diagnoses:  Problem with Foley  catheter, initial encounter [facility])    Rx / [place] Orders ED Discharge Orders     None         Darci Current 01/27/22 2043    Margette Fast, MD 02/03/22 [phone]

## 2022-01-27 NOTE — ED Provider Triage Note (Signed)
Emergency Medicine Provider Triage Evaluation Note  Edgar Or Sr. , a 87 y.o. male  was evaluated in triage.  Pt complains of leaking around the penis, had catheter changed several days ago and was put on Bactrim for UTI but noticed that catheter leaks, it is worse when he coughs or moves around..  Review of Systems  Positive: Catheter leaking Negative: Verst, chills, abdominal pain  Physical Exam  BP (!) 141/65 (BP Location: Right Arm)   Pulse 61   Temp 99 F (37.2 C) (Oral)   Resp 18   SpO2 100%  Gen:   Awake, no distress   Resp:  Normal effort  MSK:   Moves extremities without difficulty  Other:    Medical Decision Making  Medically screening exam initiated at 5:47 PM.  Appropriate orders placed.  Edgar Dan Mcbrien Sr. was informed that the remainder of the evaluation will be completed by another provider, this initial triage assessment does not replace that evaluation, and the importance of remaining in the ED until their evaluation is complete.     Gwenevere Abbot, [place] 01/27/22 [phone]

## 2022-02-01 ENCOUNTER — Telehealth (HOSPITAL_BASED_OUTPATIENT_CLINIC_OR_DEPARTMENT_OTHER): Payer: Self-pay | Admitting: Emergency Medicine

## 2022-03-02 ENCOUNTER — Other Ambulatory Visit (HOSPITAL_COMMUNITY): Payer: Self-pay

## 2022-03-04 ENCOUNTER — Other Ambulatory Visit (HOSPITAL_COMMUNITY): Payer: Self-pay

## 2022-03-07 ENCOUNTER — Other Ambulatory Visit (HOSPITAL_COMMUNITY): Payer: Self-pay

## 2022-03-07 MED ORDER — PRAVASTATIN SODIUM 20 MG PO TABS
20.0000 mg | ORAL_TABLET | Freq: Every day | ORAL | 0 refills | Status: DC
Start: 2022-03-06 — End: 2023-07-21
  Filled 2022-03-07: qty 90, 90d supply, fill #0

## 2022-03-10 ENCOUNTER — Ambulatory Visit (INDEPENDENT_AMBULATORY_CARE_PROVIDER_SITE_OTHER): Payer: Medicare Other | Admitting: Podiatry

## 2022-03-10 DIAGNOSIS — Z91199 Patient's noncompliance with other medical treatment and regimen due to unspecified reason: Secondary | ICD-10-CM

## 2022-03-10 NOTE — Progress Notes (Signed)
Patient was no-show for appointment today 

## 2022-03-12 ENCOUNTER — Emergency Department (HOSPITAL_COMMUNITY)
Admission: EM | Admit: 2022-03-12 | Discharge: 2022-03-12 | Disposition: A | Discharge status: Home / Self Care | Payer: Medicare Other | Attending: Emergency Medicine | Admitting: Emergency Medicine

## 2022-03-12 ENCOUNTER — Encounter (HOSPITAL_COMMUNITY): Payer: Self-pay

## 2022-03-12 DIAGNOSIS — T83091S Other mechanical complication of indwelling urethral catheter, sequela: Secondary | ICD-10-CM | POA: Diagnosis not present

## 2022-03-12 DIAGNOSIS — T839XXS Unspecified complication of genitourinary prosthetic device, implant and graft, sequela: Secondary | ICD-10-CM

## 2022-03-12 DIAGNOSIS — N3001 Acute cystitis with hematuria: Secondary | ICD-10-CM

## 2022-03-12 DIAGNOSIS — Z7982 Long term (current) use of aspirin: Secondary | ICD-10-CM | POA: Insufficient documentation

## 2022-03-12 DIAGNOSIS — R509 Fever, unspecified: Secondary | ICD-10-CM | POA: Diagnosis present

## 2022-03-12 LAB — URINALYSIS, ROUTINE W REFLEX MICROSCOPIC
Bilirubin Urine: NEGATIVE
Glucose, UA: NEGATIVE mg/dL
Ketones, ur: NEGATIVE mg/dL
Nitrite: POSITIVE — AB
Protein, ur: 100 mg/dL — AB
RBC / HPF: >50 RBC/hpf (ref 0–5)
Specific Gravity, Urine: 1.021 (ref 1.005–1.030)
WBC, UA: >50 WBC/hpf (ref 0–5)
pH: 7 (ref 5.0–8.0)

## 2022-03-12 MED ORDER — FOSFOMYCIN TROMETHAMINE 3 G PO PACK
3.0000 g | PACK | Freq: Once | ORAL | 0 refills | Status: AC
  Filled 2022-03-12: qty 3, 1d supply, fill #0

## 2022-03-12 MED ORDER — FOSFOMYCIN TROMETHAMINE 3 G PO PACK
3.0000 g | PACK | Freq: Once | ORAL | Status: AC
  Administered 2022-03-12: 3 g via ORAL
  Filled 2022-03-12: qty 3

## 2022-03-12 MED ORDER — LIDOCAINE HCL URETHRAL/MUCOSAL 2 % EX GEL
1.0000 | Freq: Once | CUTANEOUS | Status: AC
  Administered 2022-03-12: 1 via URETHRAL
  Filled 2022-03-12: qty 11

## 2022-03-12 NOTE — ED Notes (Signed)
Leg bag applied to catheter, given extra bariatric leg strap

## 2022-03-12 NOTE — ED Provider Notes (Signed)
[facility] Provider Note   CSN: [id number] Arrival date & time: 03/12/22  [id number]     History  Chief Complaint  Patient presents with   Catheter Issue    Edgar SCHUYLER Sr. is a 87 y.o. male with history of urinary retention with foley catheter who presents to the ER complaining of Foley catheter blockage.  States that he has been having issues on and off with this for the past 4 days.  Has not noticed any bleeding or clots.  Patient reports that he is often on had the Foley catheter for about 5 years or so.  Complaining of any fever or abdominal pain.  Called patient's son who reports that he follows with [facility], and has a follow-up appointment next week.  States that patient's not currently on any antibiotics.  HPI     Home Medications Prior to Admission medications   Medication Sig Start Date End Date Taking? Authorizing Provider  fosfomycin (MONUROL) 3 g PACK Take 3 g by mouth once for 1 dose. 03/12/22 03/12/22 Yes Tniyah Nakagawa T, PA-C  acetaminophen (TYLENOL) 325 MG tablet Take 2 tablets (650 mg total) by mouth every 6 (six) hours as needed. 12/05/20   Sharion Settler, DO  aspirin 81 MG chewable tablet Chew 1 tablet (81 mg total) by mouth daily. 03/10/21   Lavone Neri, AGNP-C  atorvastatin (LIPITOR) 40 MG tablet Take 1 tablet (40 mg total) by mouth at bedtime. 12/08/20   Ezequiel Essex, MD  cyanocobalamin 1000 MCG tablet Take 1 tablet (1,000 mcg total) by mouth daily for Vitamin Supplement. 03/10/21   Lavone Neri, AGNP-C  docusate sodium (COLACE) 100 MG capsule Take 1 capsule (100 mg total) by mouth daily for constipation. 03/10/21   Lavone Neri, AGNP-C  finasteride (PROSCAR) 5 MG tablet Take 1 tablet (5 mg total) by mouth daily. 12/05/20   Sharion Settler, DO  folic acid (FOLVITE) 1 MG tablet Take 1 tablet (1 mg total) by mouth daily for supplement. 03/10/21   Lavone Neri, AGNP-C  gabapentin (NEURONTIN)  100 MG capsule Take 1 capsule (100 mg total) by mouth 3 (three) times daily for neuropathy. 03/10/21   Lavone Neri, AGNP-C  Multiple Vitamin (MULTIVITAMIN WITH MINERALS) TABS tablet Take 1 tablet by mouth daily. 12/05/20   Sharion Settler, DO  nicotine (NICODERM CQ - DOSED IN MG/24 HOURS) 21 mg/24hr patch Place 1 patch (21 mg total) onto the skin daily. 12/05/20   Sharion Settler, DO  polyethylene glycol (MIRALAX / GLYCOLAX) 17 g packet Take 17 g by mouth daily as needed for mild constipation. 12/05/20   Sharion Settler, DO  pravastatin (PRAVACHOL) 20 MG tablet Take 1 tablet (20 mg total) by mouth daily. 11/28/21     pravastatin (PRAVACHOL) 20 MG tablet Take 1 tablet (20 mg total) by mouth daily- NEEDS VISIT 03/06/22     senna (SENOKOT) 8.6 MG TABS tablet Take 2 tablets (17.2 mg total) by mouth daily. 12/07/20   Merrily Brittle, DO  sertraline (ZOLOFT) 25 MG tablet Take 1 tablet (25 mg total) by mouth in the morning for depression. 03/10/21   Lavone Neri, AGNP-C  tamsulosin (FLOMAX) 0.4 MG CAPS capsule Take 1 capsule (0.4 mg total) by mouth daily. 03/10/21   Lavone Neri, AGNP-C  thiamine 100 MG tablet Take 1 tablet (100 mg total) by mouth daily. 12/05/20   Sharion Settler, DO      Allergies    Patient has no known allergies.  Review of Systems   Review of Systems  Physical Exam Updated Vital Signs BP 129/77 (BP Location: Left Arm)   Pulse 68   Temp 98 F (36.7 C) (Oral)   Resp 18   SpO2 98%  Physical Exam Vitals and nursing note reviewed.  Constitutional:      Appearance: Normal appearance.  HENT:     Head: Normocephalic and atraumatic.  Eyes:     Conjunctiva/sclera: Conjunctivae normal.  Cardiovascular:     Rate and Rhythm: Normal rate and regular rhythm.  Pulmonary:     Effort: Pulmonary effort is normal. No respiratory distress.     Breath sounds: Normal breath sounds.  Abdominal:     General: There is no distension.     Palpations: Abdomen is soft.      Tenderness: There is abdominal tenderness in the suprapubic area.     Comments: Mild suprapubic discomfort to palpation, no guarding or rebound.  Skin:    General: Skin is warm and dry.  Neurological:     General: No focal deficit present.     Mental Status: He is alert and oriented to person, place, and time.     ED Results / Procedures / Treatments   Labs (all labs ordered are listed, but only abnormal results are displayed) Labs Reviewed  URINALYSIS, ROUTINE W REFLEX MICROSCOPIC - Abnormal; Notable for the following components:      Result Value   APPearance CLOUDY (*)    Hgb urine dipstick MODERATE (*)    Protein, ur 100 (*)    Nitrite POSITIVE (*)    Leukocytes,Ua LARGE (*)    Bacteria, UA MANY (*)    All other components within normal limits  URINE CULTURE    EKG None  Radiology No results found.  Procedures Procedures    Medications Ordered in ED Medications  fosfomycin (MONUROL) packet 3 g (has no administration in time range)  lidocaine (XYLOCAINE) 2 % jelly 1 Application (1 Application Urethral Given 03/12/22 [id number])    ED Course/ Medical Decision Making/ A&P                             Medical Decision Making Amount and/or Complexity of Data Reviewed Labs: ordered.  This patient is a 87 y.o. male who presents to the ED for concern of foley catheter issue.   Differential diagnoses prior to evaluation: Catheter blockage, acute urinary retention, UTI  Past Medical History / Social History / Additional history: Chart reviewed. Pertinent results include: Urinary retention, sequela Foley catheter obstruction, generalized weakness, TIA  Prior urine culture from 01/22/22 that shows ESBL E. coli resistant to ceftriaxone, ciprofloxacin, Bactrim  Physical Exam: Physical exam performed. The pertinent findings include: Normal vital signs, no acute distress.  Afebrile.  Abdomen soft, with mild suprapubic tenderness to palpation.  No guarding or  rebound.  Medications / Treatment: Nursing staff attempted irrigation of foley catheter without return or drainage noted. Foley removed and replaced. Tube was clogged with gritty sediment per nursing. No blood or purulence.   Consulted with ED pharmacist based on patient's past urine culture results with ESBL E. coli.  Recommended initial dose of fosfomycin in the ER, and prescribe second dose in 48 hours.   Disposition: After consideration of the diagnostic results and the patients response to treatment, I feel that emergency department workup does not suggest an emergent condition requiring admission or immediate intervention beyond what has been performed at this  time. The plan is: Discharge to home with new Foley catheter and prescription for antibiotics.  Called son and updated him on the plan, and he will come pick the patient up.  He states they have urology follow-up scheduled for next week.  Patient is not septic, clinically well-appearing.  The patient is safe for discharge and has been instructed to return immediately for worsening symptoms, change in symptoms or any other concerns.  Final Clinical Impression(s) / ED Diagnoses Final diagnoses:  Problem with Foley catheter, sequela  Acute cystitis with hematuria    Rx / DC Orders ED Discharge Orders          Ordered    fosfomycin (MONUROL) 3 g PACK   Once        03/12/22 0950           Portions of this report may have been transcribed using voice recognition software. Every effort was made to ensure accuracy; however, inadvertent computerized transcription errors may be present.    Estill Cotta 03/12/22 [id number]    Charlesetta Shanks, MD 03/16/22 [phone]

## 2022-03-12 NOTE — Discharge Instructions (Addendum)
Edgar Mckay was seen in the ER for an issue with his Foley catheter.  We were able to exchange this for a new catheter. He has another UTI today.  We have given him the first dose of antibiotics, and have sent the second dose to his pharmacy.  Please give him this additional dose in 48 hours (on Monday).  Please follow-up with his urologist.  Return to ER for new or worsening symptoms including altered mental status/confusion, fever, increasing pain, further difficulties with catheter.

## 2022-03-12 NOTE — ED Triage Notes (Signed)
Pt arrived via POV, c/o urinary catheter blockage. States has been having on and off issues since Wednesday.

## 2022-03-12 NOTE — ED Notes (Signed)
Pt alert, NAD, calm, interactive. Getting into gown.

## 2022-03-12 NOTE — ED Notes (Addendum)
Foley removed. No purulent drainage, or blood noted. Tube clogged with gritty sediment.

## 2022-03-12 NOTE — ED Notes (Signed)
Foley intact w/o apparent visible problem. No urine output in leg bag. Attempted foley irrigation. Unable to flush or draw. No return. No blood. No drainage.

## 2022-03-14 ENCOUNTER — Other Ambulatory Visit (HOSPITAL_COMMUNITY): Payer: Self-pay

## 2022-03-14 ENCOUNTER — Other Ambulatory Visit: Payer: Self-pay

## 2022-03-16 ENCOUNTER — Other Ambulatory Visit (HOSPITAL_COMMUNITY): Payer: Self-pay

## 2022-03-16 LAB — URINE CULTURE: Culture: >=100000 — AB

## 2022-03-17 ENCOUNTER — Telehealth (HOSPITAL_BASED_OUTPATIENT_CLINIC_OR_DEPARTMENT_OTHER): Payer: Self-pay

## 2022-03-17 NOTE — Telephone Encounter (Signed)
Post ED Visit - Positive Culture Follow-up  Culture report reviewed by antimicrobial stewardship pharmacist: [facility] Team '[]'$  Elenor Quinones, Pharm.D. '[]'$  Heide Guile, Pharm.D., BCPS AQ-ID '[]'$  Parks Neptune, Pharm.D., BCPS '[x]'$  Alycia Rossetti, Pharm.D., BCPS '[]'$  Jackson, Pharm.D., BCPS, AAHIVP '[]'$  Legrand Como, Pharm.D., BCPS, AAHIVP '[]'$  Salome Arnt, PharmD, BCPS '[]'$  Johnnette Gourd, PharmD, BCPS '[]'$  Hughes Better, PharmD, BCPS '[]'$  Leeroy Cha, PharmD '[]'$  Laqueta Linden, PharmD, BCPS '[]'$  Albertina Parr, PharmD  [facility] Team '[]'$  Leodis Sias, PharmD '[]'$  Lindell Spar, PharmD '[]'$  Royetta Asal, PharmD '[]'$  Graylin Shiver, Rph '[]'$  Rema Fendt) Glennon Mac, PharmD '[]'$  Arlyn Dunning, PharmD '[]'$  Netta Cedars, PharmD '[]'$  Dia Sitter, PharmD '[]'$  Leone Haven, PharmD '[]'$  Gretta Arab, PharmD '[]'$  Theodis Shove, PharmD '[]'$  Peggyann Juba, PharmD '[]'$  Reuel Boom, PharmD   Positive urine culture Treated with Fosfomycin Tromethamine, organism sensitive to the same and no further patient follow-up is required at this time.  Glennon Hamilton 03/17/2022, 10:58 AM

## 2022-03-23 ENCOUNTER — Ambulatory Visit (INDEPENDENT_AMBULATORY_CARE_PROVIDER_SITE_OTHER): Payer: Medicare Other | Admitting: Podiatry

## 2022-03-23 DIAGNOSIS — Z91199 Patient's noncompliance with other medical treatment and regimen due to unspecified reason: Secondary | ICD-10-CM

## 2022-03-23 NOTE — Progress Notes (Signed)
Patient was no-show for appointment today 

## 2022-03-30 ENCOUNTER — Other Ambulatory Visit: Payer: Self-pay

## 2022-03-30 ENCOUNTER — Emergency Department (HOSPITAL_COMMUNITY)
Admission: EM | Admit: 2022-03-30 | Discharge: 2022-03-30 | Disposition: A | Discharge status: Home / Self Care | Payer: Medicare Other | Attending: Emergency Medicine | Admitting: Emergency Medicine

## 2022-03-30 ENCOUNTER — Encounter (HOSPITAL_COMMUNITY): Payer: Self-pay

## 2022-03-30 DIAGNOSIS — T839XXA Unspecified complication of genitourinary prosthetic device, implant and graft, initial encounter: Secondary | ICD-10-CM

## 2022-03-30 DIAGNOSIS — N472 Paraphimosis: Secondary | ICD-10-CM | POA: Diagnosis not present

## 2022-03-30 DIAGNOSIS — R339 Retention of urine, unspecified: Secondary | ICD-10-CM | POA: Diagnosis not present

## 2022-03-30 DIAGNOSIS — T83091A Other mechanical complication of indwelling urethral catheter, initial encounter: Secondary | ICD-10-CM | POA: Diagnosis not present

## 2022-03-30 NOTE — ED Triage Notes (Signed)
Pt arrived POV from home stating he is having issues with his foley. Pt states he thought the bag was leaking but it is leaking around the catheter.

## 2022-03-30 NOTE — ED Provider Notes (Signed)
[facility] Provider Note   CSN: [id number] Arrival date & time: 03/30/22  [id number]     History Chief Complaint  Patient presents with   leaking around catheter     HPI Edgar DUN Sr. is a 87 y.o. male presenting for recurrent leaking around his catheter site.  Edgar Mckay is an 87 year old male with a complex medical history.  Has had multiple episodes of paraphimosis and urinary retention was told he would have a lifelong catheter per his son.  The patient has underlying frailty and confusion and struggles to provide any history but does state he was having pain around his penis earlier today. He denies fevers chills nausea vomiting syncope shortness of breath..  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  Patient's son states he only brought him in to get control the leakage and for a catheter exchange.  This is a 7 such visit this year.  They deny any new symptoms at this time.  Patient's recorded medical, surgical, social, medication list and allergies were reviewed in the Snapshot window as part of the initial history.   Review of Systems   Review of Systems  Constitutional:  Negative for chills and fever.  HENT:  Negative for ear pain and sore throat.   Eyes:  Negative for pain and visual disturbance.  Respiratory:  Negative for cough and shortness of breath.   Cardiovascular:  Negative for chest pain and palpitations.  Gastrointestinal:  Negative for abdominal pain and vomiting.  Genitourinary:  Negative for dysuria and hematuria.  Musculoskeletal:  Negative for arthralgias and back pain.  Skin:  Negative for color change and rash.  Neurological:  Negative for seizures and syncope.  All other systems reviewed and are negative.   Physical Exam Updated Vital Signs BP 129/65   Pulse (!) 58   Temp 97.7 F (36.5 C) (Oral)   Resp 16   SpO2  98%  Physical Exam Vitals and nursing note reviewed.  Constitutional:      General: He is not in acute distress.    Appearance: He is well-developed.  HENT:     Head: Normocephalic and atraumatic.  Eyes:     Conjunctiva/sclera: Conjunctivae normal.  Cardiovascular:     Rate and Rhythm: Normal rate and regular rhythm.     Heart sounds: No murmur heard. Pulmonary:     Effort: Pulmonary effort is normal. No respiratory distress.     Breath sounds: Normal breath sounds.  Abdominal:     Palpations: Abdomen is soft.     Tenderness: There is no abdominal tenderness.  Genitourinary:    Comments: Catheter in place with copious leakage around.  Difficult to ascertain penile head due to degree of foreskin enlargement which appears chronic per chart review. Musculoskeletal:        General: No swelling.     Cervical back: Neck supple.  Skin:    General: Skin is warm and dry.     Capillary Refill: Capillary refill takes less than 2 seconds.  Neurological:     Mental Status: He is alert.  Psychiatric:        Mood and Affect: Mood normal.      ED Course/ Medical Decision Making/ A&P Clinical Course as of 03/30/22 0930  Wed Mar 30, 2022  0928 Replaced catheter with larger size per nursing [CC]    Clinical Course User  Index [CC] Tretha Sciara, MD    Procedures Procedures   Medications Ordered in ED Medications - No data to display  Medical Decision Making:    Edgar PITRE Sr. is a 87 y.o. male who presented to the ED today with Foley catheter complication detailed above.    I had an extensive conversation with the son regarding his ongoing care and management.  Leaking around the catheter site may indicate that patient possibly no longer needs Foley catheterization.  However son states patient was told he would likely need it for lifelong needs and they have scheduled follow-up with urology for next week due to his history of catheter complications.  Had a prolonged shared  medical decision making conversation with the patient's son.  He would like to proceed with catheter replacement today to get control of the leaking and then plan to follow-up with the urologist next week. They deny any other concerns such as urinary tract infection, worsening mentation, fevers or any other symptoms at this time.  They feel comfortable with outpatient care and management following replacement of the catheter. Disposition:  I have considered need for hospitalization, however, considering all of the above, I believe this patient is stable for discharge at this time.  Patient/family educated about specific return precautions for given chief complaint and symptoms.  Patient/family educated about follow-up with PCP/Urology.     Patient/family expressed understanding of return precautions and need for follow-up. Patient spoken to regarding all imaging and laboratory results and appropriate follow up for these results. All education provided in verbal form with additional information in written form. Time was allowed for answering of patient questions. Patient discharged.    Emergency Department Medication Summary:   Medications - No data to display       Clinical Impression:  1. Problem with Foley catheter, initial encounter [facility])      Discharge   Final Clinical Impression(s) / ED Diagnoses Final diagnoses:  Problem with Foley catheter, initial encounter [facility])    Rx / DC Orders ED Discharge Orders     None         Tretha Sciara, MD 03/30/22 0930

## 2022-03-31 ENCOUNTER — Ambulatory Visit (INDEPENDENT_AMBULATORY_CARE_PROVIDER_SITE_OTHER): Payer: Medicare Other | Admitting: Podiatry

## 2022-03-31 DIAGNOSIS — M79674 Pain in right toe(s): Secondary | ICD-10-CM

## 2022-03-31 DIAGNOSIS — B351 Tinea unguium: Secondary | ICD-10-CM

## 2022-03-31 DIAGNOSIS — M79675 Pain in left toe(s): Secondary | ICD-10-CM

## 2022-04-03 NOTE — Progress Notes (Signed)
  Subjective:  Patient ID: Edgar FILAR Sr., male    DOB: 1935-04-14,  MRN: [id number]  Chief Complaint  Patient presents with   Nail Problem    painful nails, nail trim    87 y.o. male presents with the above complaint. History confirmed with patient.  Nails become thickened and elongated again, regular intermittent debridements have been helpful for this  Objective:  Physical Exam: warm, good capillary refill, no trophic changes or ulcerative lesions, normal DP and PT pulses, and normal sensory exam. Left Foot: dystrophic yellowed discolored nail plates with subungual debris Right Foot: dystrophic yellowed discolored nail plates with subungual debris  Assessment:   1. Pain due to onychomycosis of toenails of both feet      Plan:  Patient was evaluated and treated and all questions answered.  Discussed the etiology and treatment options for the condition in detail with the patient.  Prior debridements helpful. Recommended debridement of the nails today. Sharp and mechanical debridement performed of all painful and mycotic nails today. Nails debrided in length and thickness using a nail nipper to level of comfort. Discussed treatment options including appropriate shoe gear. Follow up as needed for painful nails.    Return if symptoms worsen or fail to improve.

## 2022-04-09 ENCOUNTER — Encounter (HOSPITAL_COMMUNITY): Payer: Self-pay

## 2022-04-09 ENCOUNTER — Emergency Department (HOSPITAL_COMMUNITY)
Admission: EM | Admit: 2022-04-09 | Discharge: 2022-04-09 | Disposition: A | Discharge status: Home / Self Care | Payer: Medicare Other | Attending: Emergency Medicine | Admitting: Emergency Medicine

## 2022-04-09 ENCOUNTER — Other Ambulatory Visit (HOSPITAL_COMMUNITY): Payer: Self-pay

## 2022-04-09 DIAGNOSIS — N390 Urinary tract infection, site not specified: Secondary | ICD-10-CM | POA: Diagnosis not present

## 2022-04-09 DIAGNOSIS — Z7982 Long term (current) use of aspirin: Secondary | ICD-10-CM | POA: Insufficient documentation

## 2022-04-09 DIAGNOSIS — T83511A Infection and inflammatory reaction due to indwelling urethral catheter, initial encounter: Secondary | ICD-10-CM | POA: Insufficient documentation

## 2022-04-09 DIAGNOSIS — T83091A Other mechanical complication of indwelling urethral catheter, initial encounter: Secondary | ICD-10-CM

## 2022-04-09 DIAGNOSIS — R339 Retention of urine, unspecified: Secondary | ICD-10-CM | POA: Diagnosis present

## 2022-04-09 LAB — URINALYSIS, W/ REFLEX TO CULTURE (INFECTION SUSPECTED)
Bilirubin Urine: NEGATIVE
Glucose, UA: NEGATIVE mg/dL
Ketones, ur: NEGATIVE mg/dL
Nitrite: NEGATIVE
Protein, ur: 100 mg/dL — AB
RBC / HPF: >50 RBC/hpf (ref 0–5)
Specific Gravity, Urine: 1.021 (ref 1.005–1.030)
WBC, UA: >50 WBC/hpf (ref 0–5)
pH: 7 (ref 5.0–8.0)

## 2022-04-09 LAB — CBC WITH DIFFERENTIAL/PLATELET
Abs Immature Granulocytes: 0.02 10*3/uL (ref 0.00–0.07)
Basophils Absolute: 0.1 10*3/uL (ref 0.0–0.1)
Basophils Relative: 1 %
Eosinophils Absolute: 0.2 10*3/uL (ref 0.0–0.5)
Eosinophils Relative: 3 %
HCT: 38.4 % — ABNORMAL LOW (ref 39.0–52.0)
Hemoglobin: 11.9 g/dL — ABNORMAL LOW (ref 13.0–17.0)
Immature Granulocytes: 0 %
Lymphocytes Relative: 31 %
Lymphs Abs: 2 10*3/uL (ref 0.7–4.0)
MCH: 27.1 pg (ref 26.0–34.0)
MCHC: 31 g/dL (ref 30.0–36.0)
MCV: 87.5 fL (ref 80.0–100.0)
Monocytes Absolute: 0.7 10*3/uL (ref 0.1–1.0)
Monocytes Relative: 11 %
Neutro Abs: 3.5 10*3/uL (ref 1.7–7.7)
Neutrophils Relative %: 54 %
Platelets: 157 10*3/uL (ref 150–400)
RBC: 4.39 MIL/uL (ref 4.22–5.81)
RDW: 14.2 % (ref 11.5–15.5)
WBC: 6.4 10*3/uL (ref 4.0–10.5)
nRBC: 0 % (ref 0.0–0.2)

## 2022-04-09 LAB — BASIC METABOLIC PANEL
Anion gap: 7 (ref 5–15)
BUN: 14 mg/dL (ref 8–23)
CO2: 25 mmol/L (ref 22–32)
Calcium: 8.6 mg/dL — ABNORMAL LOW (ref 8.9–10.3)
Chloride: 108 mmol/L (ref 98–111)
Creatinine, Ser: 0.85 mg/dL (ref 0.61–1.24)
GFR, Estimated: >60 mL/min (ref >60)
Glucose, Bld: 114 mg/dL — ABNORMAL HIGH (ref 70–99)
Potassium: 3.4 mmol/L — ABNORMAL LOW (ref 3.5–5.1)
Sodium: 140 mmol/L (ref 135–145)

## 2022-04-09 MED ORDER — SULFAMETHOXAZOLE-TRIMETHOPRIM 800-160 MG PO TABS
1.0000 | ORAL_TABLET | Freq: Two times a day (BID) | ORAL | 0 refills | Status: AC
  Filled 2022-04-09: qty 10, 5d supply, fill #0

## 2022-04-09 MED ORDER — NITROFURANTOIN MONOHYD MACRO 100 MG PO CAPS
100.0000 mg | ORAL_CAPSULE | Freq: Two times a day (BID) | ORAL | 0 refills | Status: DC
  Filled 2022-04-09: qty 10, 5d supply, fill #0

## 2022-04-09 NOTE — Discharge Instructions (Addendum)
Please follow-up outpatient for continued care regarding your Foley catheter.  Take antibiotics as your urine appeared infected and infection can cause Foley catheter obstruction.

## 2022-04-09 NOTE — ED Triage Notes (Signed)
Pt states that his foley is not draining. Pt states it stopped draining last night.

## 2022-04-09 NOTE — ED Provider Notes (Signed)
[place] Provider Note   CSN: [id number] Arrival date & time: 04/09/22  [id number]     History  Chief Complaint  Patient presents with   Urinary Retention    Edgar Dan Buske Sr. is a 87 y.o. male.  HPI   87 year old male presenting to the emergency department with chief complaint of his Foley catheter not draining.  The patient states that it stopped draining last night.  He has had some suprapubic pain.  Urine has been coming out in his diapers around the Foley.  He denies any fevers or chills or flank pain.  He has a history of multiple episodes of paraphimosis and urinary retention and was told he had to have a lifelong Foley catheter.  Has had multiple visits to the emergency department for catheter exchange. No abdominal pain, nausea or vomiting.  Home Medications Prior to Admission medications   Medication Sig Start Date End Date Taking? Authorizing Provider  nitrofurantoin, macrocrystal-monohydrate, (MACROBID) 100 MG capsule Take 1 capsule (100 mg total) by mouth 2 (two) times daily. 04/09/22  Yes Regan Lemming, MD  sulfamethoxazole-trimethoprim (BACTRIM DS) 800-160 MG tablet Take 1 tablet by mouth 2 (two) times daily for 5 days. 04/09/22 04/14/22 Yes Regan Lemming, MD  acetaminophen (TYLENOL) 325 MG tablet Take 2 tablets (650 mg total) by mouth every 6 (six) hours as needed. 12/05/20   Sharion Settler, DO  aspirin 81 MG chewable tablet Chew 1 tablet (81 mg total) by mouth daily. 03/10/21   Lavone Neri, AGNP-C  atorvastatin (LIPITOR) 40 MG tablet Take 1 tablet (40 mg total) by mouth at bedtime. 12/08/20   Ezequiel Essex, MD  cyanocobalamin 1000 MCG tablet Take 1 tablet (1,000 mcg total) by mouth daily for Vitamin Supplement. 03/10/21   Lavone Neri, AGNP-C  docusate sodium (COLACE) 100 MG capsule Take 1 capsule (100 mg total) by mouth daily for constipation. 03/10/21   Lavone Neri, AGNP-C  finasteride (PROSCAR) 5 MG tablet  Take 1 tablet (5 mg total) by mouth daily. 12/05/20   Sharion Settler, DO  folic acid (FOLVITE) 1 MG tablet Take 1 tablet (1 mg total) by mouth daily for supplement. 03/10/21   Lavone Neri, AGNP-C  gabapentin (NEURONTIN) 100 MG capsule Take 1 capsule (100 mg total) by mouth 3 (three) times daily for neuropathy. 03/10/21   Lavone Neri, AGNP-C  Multiple Vitamin (MULTIVITAMIN WITH MINERALS) TABS tablet Take 1 tablet by mouth daily. 12/05/20   Sharion Settler, DO  nicotine (NICODERM CQ - DOSED IN MG/24 HOURS) 21 mg/24hr patch Place 1 patch (21 mg total) onto the skin daily. 12/05/20   Sharion Settler, DO  polyethylene glycol (MIRALAX / GLYCOLAX) 17 g packet Take 17 g by mouth daily as needed for mild constipation. 12/05/20   Sharion Settler, DO  pravastatin (PRAVACHOL) 20 MG tablet Take 1 tablet (20 mg total) by mouth daily. 11/28/21     pravastatin (PRAVACHOL) 20 MG tablet Take 1 tablet (20 mg total) by mouth daily- NEEDS VISIT 03/06/22     senna (SENOKOT) 8.6 MG TABS tablet Take 2 tablets (17.2 mg total) by mouth daily. 12/07/20   Merrily Brittle, DO  sertraline (ZOLOFT) 25 MG tablet Take 1 tablet (25 mg total) by mouth in the morning for depression. 03/10/21   Lavone Neri, AGNP-C  tamsulosin (FLOMAX) 0.4 MG CAPS capsule Take 1 capsule (0.4 mg total) by mouth daily. 03/10/21   Lavone Neri, AGNP-C  thiamine 100 MG tablet Take 1 tablet (100 mg total)  by mouth daily. 12/05/20   Sharion Settler, DO      Allergies    Patient has no known allergies.    Review of Systems   Review of Systems  All other systems reviewed and are negative.   Physical Exam Updated Vital Signs BP (!) 156/82   Pulse 63   Temp 97.6 F (36.4 C) (Oral)   Resp 16   Ht 5\' 9"  (1.753 m)   Wt 98.9 kg   SpO2 100%   BMI 32.19 kg/m  Physical Exam Vitals and nursing note reviewed.  Constitutional:      General: He is not in acute distress. HENT:     Head: Normocephalic and atraumatic.  Eyes:      Conjunctiva/sclera: Conjunctivae normal.     Pupils: Pupils are equal, round, and reactive to light.  Cardiovascular:     Rate and Rhythm: Normal rate and regular rhythm.  Pulmonary:     Effort: Pulmonary effort is normal. No respiratory distress.  Abdominal:     General: There is no distension.     Tenderness: There is no abdominal tenderness. There is no guarding.     Comments: No abdominal tenderness  Genitourinary:    Comments: Foley catheter in place, not actively draining urine Musculoskeletal:        General: No deformity or signs of injury.     Cervical back: Neck supple.  Skin:    Findings: No lesion or rash.  Neurological:     General: No focal deficit present.     Mental Status: He is alert. Mental status is at baseline.     ED Results / Procedures / Treatments   Labs (all labs ordered are listed, but only abnormal results are displayed) Labs Reviewed  CBC WITH DIFFERENTIAL/PLATELET - Abnormal; Notable for the following components:      Result Value   Hemoglobin 11.9 (*)    HCT 38.4 (*)    All other components within normal limits  BASIC METABOLIC PANEL - Abnormal; Notable for the following components:   Potassium 3.4 (*)    Glucose, Bld 114 (*)    Calcium 8.6 (*)    All other components within normal limits  URINALYSIS, W/ REFLEX TO CULTURE (INFECTION SUSPECTED) - Abnormal; Notable for the following components:   APPearance CLOUDY (*)    Hgb urine dipstick MODERATE (*)    Protein, ur 100 (*)    Leukocytes,Ua LARGE (*)    Bacteria, UA MANY (*)    All other components within normal limits  URINE CULTURE    EKG None  Radiology No results found.  Procedures BLADDER CATHETERIZATION  Date/Time: 04/09/2022 11:31 AM  Performed by: Bowen, Latricia Heft, RN Authorized by: Regan Lemming, MD   Consent:    Consent obtained:  Verbal   Consent given by:  Patient Universal protocol:    Patient identity confirmed:  Verbally with patient Pre-procedure details:     Procedure purpose:  Therapeutic   Preparation: Patient was prepped and draped in usual sterile fashion   Post-procedure details:    Procedure completion:  Tolerated     Medications Ordered in ED Medications - No data to display  ED Course/ Medical Decision Making/ A&P                             Medical Decision Making Amount and/or Complexity of Data Reviewed Labs: ordered.  Risk Prescription drug management.    87 year old  male presenting to the emergency department with chief complaint of his Foley catheter not draining.  The patient states that it stopped draining last night.  He has had some suprapubic pain.  Urine has been coming out in his diapers around the Foley.  He denies any fevers or chills or flank pain.  He has a history of multiple episodes of paraphimosis and urinary retention and was told he had to have a lifelong Foley catheter.  Has had multiple visits to the emergency department for catheter exchange. No abdominal pain, nausea or vomiting.  On arrival, the patient was vitally stable.  Physical exam generally unremarkable with minimal tenderness on exam, Foley catheter appears to be obstructed with no urine draining.  Patient's catheter was exchanged by nursing.  Urinalysis revealed evidence of UTI.  Has previously grown out multidrug-resistant E. coli and Proteus on recent cultures.  Previous E. coli sensitive to nitrofurantoin, Proteus sensistive to bactrim, resistant to nitrofurantoin.  Labs revealed a CBC without a leukocytosis or significant anemia, hemoglobin 11.9, BMP without AKI.  Patient feeling symptomatically improved following Foley catheter exchange.  Will discharge the patient on Bactrim and nitrofurantoin, advised outpatient follow-up.    Final Clinical Impression(s) / ED Diagnoses Final diagnoses:  Obstruction of Foley catheter, initial encounter ([facility])  Urinary tract infection associated with indwelling urethral catheter, initial encounter  [facility])    Rx / DC Orders ED Discharge Orders          Ordered    nitrofurantoin, macrocrystal-monohydrate, (MACROBID) 100 MG capsule  2 times daily        04/09/22 1130    sulfamethoxazole-trimethoprim (BACTRIM DS) 800-160 MG tablet  2 times daily        04/09/22 1133              Regan Lemming, MD 04/09/22 1133

## 2022-04-11 LAB — URINE CULTURE: Culture: >=100000 — AB

## 2022-04-12 ENCOUNTER — Telehealth (HOSPITAL_BASED_OUTPATIENT_CLINIC_OR_DEPARTMENT_OTHER): Payer: Self-pay | Admitting: Emergency Medicine

## 2022-04-12 NOTE — Telephone Encounter (Signed)
Post ED Visit - Positive Culture Follow-up  Culture report reviewed by antimicrobial stewardship pharmacist: [place] Team []  Elenor Quinones, Pharm.D. []  Heide Guile, Pharm.D., BCPS AQ-ID []  Parks Neptune, Pharm.D., BCPS []  Alycia Rossetti, Pharm.D., BCPS []  [place], Pharm.D., BCPS, AAHIVP []  Legrand Como, Pharm.D., BCPS, AAHIVP []  Salome Arnt, PharmD, BCPS []  Johnnette Gourd, PharmD, BCPS []  Hughes Better, PharmD, BCPS []  Leeroy Cha, PharmD []  Laqueta Linden, PharmD, BCPS []  Albertina Parr, PharmD  [place] Team []  Leodis Sias, PharmD []  Lindell Spar, PharmD []  Royetta Asal, PharmD []  Graylin Shiver, Rph []  Rema Fendt) Glennon Mac, PharmD []  Arlyn Dunning, PharmD []  Netta Cedars, PharmD []  Dia Sitter, PharmD []  Leone Haven, PharmD []  Gretta Arab, PharmD []  Theodis Shove, PharmD []  Peggyann Juba, PharmD []  Reuel Boom, PharmD   Positive urine culture Treated with sulfamethoxazole-trimethoprim, organism sensitive to the same and no further patient follow-up is required at this time.  Hazle Nordmann 04/12/2022, 8:59 AM

## 2022-04-23 ENCOUNTER — Other Ambulatory Visit: Payer: Self-pay

## 2022-04-23 ENCOUNTER — Emergency Department (HOSPITAL_COMMUNITY)
Admission: EM | Admit: 2022-04-23 | Discharge: 2022-04-23 | Disposition: A | Discharge status: Home / Self Care | Payer: Medicare Other | Attending: Emergency Medicine | Admitting: Emergency Medicine

## 2022-04-23 DIAGNOSIS — Y838 Other surgical procedures as the cause of abnormal reaction of the patient, or of later complication, without mention of misadventure at the time of the procedure: Secondary | ICD-10-CM | POA: Insufficient documentation

## 2022-04-23 DIAGNOSIS — Z7982 Long term (current) use of aspirin: Secondary | ICD-10-CM | POA: Insufficient documentation

## 2022-04-23 DIAGNOSIS — R339 Retention of urine, unspecified: Secondary | ICD-10-CM | POA: Diagnosis present

## 2022-04-23 DIAGNOSIS — F17210 Nicotine dependence, cigarettes, uncomplicated: Secondary | ICD-10-CM | POA: Diagnosis not present

## 2022-04-23 DIAGNOSIS — T83091A Other mechanical complication of indwelling urethral catheter, initial encounter: Secondary | ICD-10-CM | POA: Insufficient documentation

## 2022-04-23 DIAGNOSIS — N3090 Cystitis, unspecified without hematuria: Secondary | ICD-10-CM

## 2022-04-23 LAB — URINALYSIS, ROUTINE W REFLEX MICROSCOPIC
Glucose, UA: NEGATIVE mg/dL
Ketones, ur: NEGATIVE mg/dL
Nitrite: NEGATIVE
Protein, ur: 100 mg/dL — AB
Specific Gravity, Urine: 1.02 (ref 1.005–1.030)
WBC, UA: >50 WBC/hpf (ref 0–5)
pH: 7 (ref 5.0–8.0)

## 2022-04-23 MED ORDER — FOSFOMYCIN TROMETHAMINE 3 G PO PACK
3.0000 g | PACK | Freq: Once | ORAL | Status: AC
  Administered 2022-04-23: 3 g via ORAL
  Filled 2022-04-23: qty 3

## 2022-04-23 NOTE — ED Notes (Signed)
Unable to irrigate pt's existing foley. MD notified

## 2022-04-23 NOTE — ED Triage Notes (Signed)
Pt reports since last night no urine is draining into the foley bag, but he is rather urinating around the catheter onto himself.

## 2022-04-23 NOTE — ED Notes (Signed)
Attempted to call Pt's Son for ride per pt request but no answer at this time.

## 2022-04-23 NOTE — ED Provider Notes (Signed)
[place] EMERGENCY DEPARTMENT AT [facility] Provider Note  CSN: [id number] Arrival date & time: 04/23/22 0745  Chief Complaint(s) No chief complaint on file.  HPI Edgar BAZINET Sr. is a 87 y.o. male with history of chronic urinary retention with chronic Foley catheter presenting to the emergency department with Foley issue.  Reports that he has had no drainage from his Foley since last night.  He denies any other symptoms such as abdominal pain, fevers or chills, back pain, lower abdominal pain, nausea or vomiting, lightheadedness or dizziness.  Reports this has happened multiple times recently.   Past Medical History Past Medical History:  Diagnosis Date   Urinary retention    Patient Active Problem List   Diagnosis Date Noted   TIA (transient ischemic attack) 12/07/2020   Urinary retention 12/05/2020   Muscle weakness (generalized) 12/05/2020   Unsteadiness on feet 12/05/2020   Frailty 12/05/2020   Acute cystitis with hematuria    Poor social situation    Obstruction of Foley catheter 11/30/2020   Home Medication(s) Prior to Admission medications   Medication Sig Start Date End Date Taking? Authorizing Provider  acetaminophen (TYLENOL) 325 MG tablet Take 2 tablets (650 mg total) by mouth every 6 (six) hours as needed. 12/05/20   Sabino Dick, DO  aspirin 81 MG chewable tablet Chew 1 tablet (81 mg total) by mouth daily. 03/10/21   Bonnita Nasuti, AGNP-C  atorvastatin (LIPITOR) 40 MG tablet Take 1 tablet (40 mg total) by mouth at bedtime. 12/08/20   Fayette Pho, MD  cyanocobalamin 1000 MCG tablet Take 1 tablet (1,000 mcg total) by mouth daily for Vitamin Supplement. 03/10/21   Bonnita Nasuti, AGNP-C  docusate sodium (COLACE) 100 MG capsule Take 1 capsule (100 mg total) by mouth daily for constipation. 03/10/21   Bonnita Nasuti, AGNP-C  finasteride (PROSCAR) 5 MG tablet Take 1 tablet (5 mg total) by mouth daily. 12/05/20   Sabino Dick, DO  folic  acid (FOLVITE) 1 MG tablet Take 1 tablet (1 mg total) by mouth daily for supplement. 03/10/21   Bonnita Nasuti, AGNP-C  gabapentin (NEURONTIN) 100 MG capsule Take 1 capsule (100 mg total) by mouth 3 (three) times daily for neuropathy. 03/10/21   Bonnita Nasuti, AGNP-C  Multiple Vitamin (MULTIVITAMIN WITH MINERALS) TABS tablet Take 1 tablet by mouth daily. 12/05/20   Sabino Dick, DO  nicotine (NICODERM CQ - DOSED IN MG/24 HOURS) 21 mg/24hr patch Place 1 patch (21 mg total) onto the skin daily. 12/05/20   Sabino Dick, DO  nitrofurantoin, macrocrystal-monohydrate, (MACROBID) 100 MG capsule Take 1 capsule (100 mg total) by mouth 2 (two) times daily. 04/09/22   Ernie Avena, MD  polyethylene glycol (MIRALAX / GLYCOLAX) 17 g packet Take 17 g by mouth daily as needed for mild constipation. 12/05/20   Sabino Dick, DO  pravastatin (PRAVACHOL) 20 MG tablet Take 1 tablet (20 mg total) by mouth daily. 11/28/21     pravastatin (PRAVACHOL) 20 MG tablet Take 1 tablet (20 mg total) by mouth daily- NEEDS VISIT 03/06/22     senna (SENOKOT) 8.6 MG TABS tablet Take 2 tablets (17.2 mg total) by mouth daily. 12/07/20   Princess Bruins, DO  sertraline (ZOLOFT) 25 MG tablet Take 1 tablet (25 mg total) by mouth in the morning for depression. 03/10/21   Bonnita Nasuti, AGNP-C  tamsulosin (FLOMAX) 0.4 MG CAPS capsule Take 1 capsule (0.4 mg total) by mouth daily. 03/10/21   Bonnita Nasuti, AGNP-C  thiamine 100 MG tablet Take 1 tablet (  100 mg total) by mouth daily. 12/05/20   Sabino Dick, DO                                                                                                                                    Past Surgical History Past Surgical History:  Procedure Laterality Date   PROSTATE BIOPSY  1990   Family History Family History  Family history unknown: Yes    Social History Social History   Tobacco Use   Smoking status: Every Day    Packs/day: 1.00    Years: 60.00     Additional pack years: 0.00    Total pack years: 60.00    Types: Cigarettes   Smokeless tobacco: Never  Vaping Use   Vaping Use: Never used  Substance Use Topics   Alcohol use: Not Currently   Drug use: No   Allergies Patient has no known allergies.  Review of Systems Review of Systems  All other systems reviewed and are negative.   Physical Exam Vital Signs  I have reviewed the triage vital signs BP (!) 151/69   Pulse 60   Temp 98.4 F (36.9 C) (Oral)   Resp 17   SpO2 99%  Physical Exam Vitals and nursing note reviewed.  Constitutional:      General: He is not in acute distress.    Appearance: Normal appearance.  HENT:     Head: Normocephalic and atraumatic.     Mouth/Throat:     Mouth: Mucous membranes are moist.  Eyes:     Conjunctiva/sclera: Conjunctivae normal.  Cardiovascular:     Rate and Rhythm: Normal rate.  Pulmonary:     Effort: Pulmonary effort is normal. No respiratory distress.  Abdominal:     General: Abdomen is flat. There is no distension.     Tenderness: There is no abdominal tenderness. There is no right CVA tenderness or left CVA tenderness.  Skin:    General: Skin is warm and dry.     Capillary Refill: Capillary refill takes less than 2 seconds.  Neurological:     General: No focal deficit present.     Mental Status: He is alert. Mental status is at baseline.  Psychiatric:        Mood and Affect: Mood normal.        Behavior: Behavior normal.     ED Results and Treatments Labs (all labs ordered are listed, but only abnormal results are displayed) Labs Reviewed  URINALYSIS, ROUTINE W REFLEX MICROSCOPIC - Abnormal; Notable for the following components:      Result Value   Color, Urine AMBER (*)    APPearance TURBID (*)    Hgb urine dipstick SMALL (*)    Bilirubin Urine SMALL (*)    Protein, ur 100 (*)    Leukocytes,Ua LARGE (*)    Bacteria, UA RARE (*)    All other components within normal limits  URINE CULTURE  Radiology No results found.  Pertinent labs & imaging results that were available during my care of the patient were reviewed by me and considered in my medical decision making (see MDM for details).  Medications Ordered in ED Medications  fosfomycin (MONUROL) packet 3 g (has no administration in time range)                                                                                                                                     Procedures Procedures  (including critical care time)  Medical Decision Making / ED Course   MDM:  87 year old male presenting to the emergency department with Foley catheter obstruction.  On exam, patient well-appearing, no abdominal tenderness, no CVA tenderness.  Symptoms most likely due to chronic indwelling Foley.  He has had some evidence of urinary infection on previous urinalyses but clinically does not have any lower abdominal tenderness or pain, systemic symptoms or signs of pyelonephritis.  Will exchange Foley catheter and send urinalysis.  Clinical Course as of 04/23/22 1111  Sat Apr 23, 2022  1109 Foley catheter replaced by nursing.  Urinalysis does show WBC clumps, bacteria, large leukocytes.  Difficult to say whether this could be colonization.  Patient has had many previous positive urinalyses.  Given blockage of catheter and sediment, will give one-time dose of fosfomycin.  Advise follow-up with urology. Will discharge patient to home. All questions answered. Patient comfortable with plan of discharge. Return precautions discussed with patient and specified on the after visit summary.  [WS]    Clinical Course User Index [WS] Lonell Grandchild, MD     Additional history obtained: -External records from outside source obtained and reviewed including: Chart review including previous notes, labs, imaging, consultation notes  including numerous previous ER visits for the same   Lab Tests: -I ordered, reviewed, and interpreted labs.   The pertinent results include:   Labs Reviewed  URINALYSIS, ROUTINE W REFLEX MICROSCOPIC - Abnormal; Notable for the following components:      Result Value   Color, Urine AMBER (*)    APPearance TURBID (*)    Hgb urine dipstick SMALL (*)    Bilirubin Urine SMALL (*)    Protein, ur 100 (*)    Leukocytes,Ua LARGE (*)    Bacteria, UA RARE (*)    All other components within normal limits  URINE CULTURE    Notable for findings potentially concerning for UTI   Medicines ordered and prescription drug management: Meds ordered this encounter  Medications   fosfomycin (MONUROL) packet 3 g    -I have reviewed the patients home medicines and have made adjustments as needed   Social Determinants of Health:  Diagnosis or treatment significantly limited by social determinants of health: current smoker   Reevaluation: After the interventions noted above, I reevaluated the patient and found that their symptoms have improved  Co morbidities that complicate the patient evaluation  Past Medical History:  Diagnosis Date   Urinary retention       Dispostion: Disposition decision including need for hospitalization was considered, and patient discharged from emergency department.    Final Clinical Impression(s) / ED Diagnoses Final diagnoses:  Obstruction of Foley catheter, initial encounter  Cystitis     This chart was dictated using voice recognition software.  Despite best efforts to proofread,  errors can occur which can change the documentation meaning.    Lonell Grandchild, MD 04/23/22 [phone]

## 2022-04-23 NOTE — ED Notes (Signed)
AVS reviewed with patient. Urine bag in place and secured. Pt belongings given to patient. Pt wheeled to lobby to wait for son. Pt denies any complaints at time of discharge.

## 2022-04-23 NOTE — ED Notes (Signed)
Pt's son called at this time. Son states he will be at the ED shortly to pick pt up.

## 2022-04-23 NOTE — Discharge Instructions (Signed)
We evaluated you for your urinary catheter obstruction.  We replaced the catheter.  Your urine did show a possible urinary infection so we gave you a one-time dose of antibiotics in the emergency department.  You do not need a prescription.  Please follow-up with your urologist so they can determine why you are having these frequent obstructions of your urine catheter.  Return to the emergency department if you develop any abdominal pain, fevers or chills, flank pain, lightheadedness or dizziness, nausea or vomiting, or any other concerning symptoms.

## 2022-04-26 ENCOUNTER — Other Ambulatory Visit (HOSPITAL_COMMUNITY): Payer: Self-pay

## 2022-04-26 LAB — URINE CULTURE: Culture: >=100000 — AB

## 2022-04-28 ENCOUNTER — Telehealth (HOSPITAL_BASED_OUTPATIENT_CLINIC_OR_DEPARTMENT_OTHER): Payer: Self-pay | Admitting: *Deleted

## 2022-04-28 NOTE — Telephone Encounter (Signed)
Post ED Visit - Positive Culture Follow-up  Culture report reviewed by antimicrobial stewardship pharmacist: [facility]  Enzo Bi, Pharm.D.  Celedonio Miyamoto, Pharm.D., BCPS AQ-ID  Garvin Fila, Pharm.D., BCPS  Georgina Pillion, Pharm.D., BCPS  [place], [address].D., BCPS, AAHIVP  Estella Husk, Pharm.D., BCPS, AAHIVP  Lysle Pearl, PharmD, BCPS  Phillips Climes, PharmD, BCPS  Agapito Games, PharmD, BCPS  Verlan Friends, PharmD  Mervyn Gay, PharmD, BCPS  Vinnie Level, PharmD  [facility] Team  Len Childs, PharmD  Greer Pickerel, PharmD  Adalberto Cole, PharmD  Perlie Gold, Rph  Lonell Face) Jean Rosenthal, PharmD  Earl Many, PharmD  Junita Push, PharmD  Dorna Leitz, PharmD  Terrilee Files, PharmD  Lynann Beaver, PharmD  Keturah Barre, PharmD  Loralee Pacas, PharmD  Bernadene Person, PharmD   Positive urine culture Treated with Fosfomycin, organism sensitive to the same and no further patient follow-up is required at this time.Wilburn Cornelia, PharmD  Virl Axe Talley 04/28/2022, 8:38 AM

## 2022-05-08 ENCOUNTER — Other Ambulatory Visit: Payer: Self-pay

## 2022-05-08 ENCOUNTER — Emergency Department (HOSPITAL_COMMUNITY)
Admission: EM | Admit: 2022-05-08 | Discharge: 2022-05-08 | Disposition: A | Discharge status: Home / Self Care | Payer: Medicare Other | Attending: Emergency Medicine | Admitting: Emergency Medicine

## 2022-05-08 ENCOUNTER — Encounter (HOSPITAL_COMMUNITY): Payer: Self-pay

## 2022-05-08 DIAGNOSIS — T83091A Other mechanical complication of indwelling urethral catheter, initial encounter: Secondary | ICD-10-CM | POA: Insufficient documentation

## 2022-05-08 DIAGNOSIS — Y732 Prosthetic and other implants, materials and accessory gastroenterology and urology devices associated with adverse incidents: Secondary | ICD-10-CM | POA: Diagnosis not present

## 2022-05-08 DIAGNOSIS — Z7982 Long term (current) use of aspirin: Secondary | ICD-10-CM | POA: Diagnosis not present

## 2022-05-08 DIAGNOSIS — T839XXA Unspecified complication of genitourinary prosthetic device, implant and graft, initial encounter: Secondary | ICD-10-CM

## 2022-05-08 DIAGNOSIS — R600 Localized edema: Secondary | ICD-10-CM | POA: Diagnosis not present

## 2022-05-08 NOTE — ED Triage Notes (Signed)
Pt states he emptied the foley catheter bag yesterday at 1100 and ever since then the urine has been coming out around his penis.

## 2022-05-08 NOTE — ED Notes (Signed)
Discharge instructions discussed with pt. Verbalized understanding. VSS. No questions or concerns regarding discharge  

## 2022-05-08 NOTE — ED Provider Notes (Signed)
[place] EMERGENCY DEPARTMENT AT [facility] Provider Note   CSN: [id number] Arrival date & time: 05/08/22  0715     History  Chief Complaint  Patient presents with   foley catheter problem     Edgar Mckay Sr. is a 87 y.o. male.  HPI   This patient is an 87 year old male, presents with a history of long time at least multiple years worth of urinary retention currently with a Foley catheter in place but unfortunately has recurrent episodes of Foley catheter dysfunction where the catheter becomes clogged or dysfunctional and he has significant urinary leakage around it.  He has no other systemic symptoms and no other complaints.  This started overnight  Home Medications Prior to Admission medications   Medication Sig Start Date End Date Taking? Authorizing Provider  acetaminophen (TYLENOL) 325 MG tablet Take 2 tablets (650 mg total) by mouth every 6 (six) hours as needed. 12/05/20   Sabino Dick, DO  aspirin 81 MG chewable tablet Chew 1 tablet (81 mg total) by mouth daily. 03/10/21   Bonnita Nasuti, AGNP-C  atorvastatin (LIPITOR) 40 MG tablet Take 1 tablet (40 mg total) by mouth at bedtime. 12/08/20   Fayette Pho, MD  cyanocobalamin 1000 MCG tablet Take 1 tablet (1,000 mcg total) by mouth daily for Vitamin Supplement. 03/10/21   Bonnita Nasuti, AGNP-C  docusate sodium (COLACE) 100 MG capsule Take 1 capsule (100 mg total) by mouth daily for constipation. 03/10/21   Bonnita Nasuti, AGNP-C  finasteride (PROSCAR) 5 MG tablet Take 1 tablet (5 mg total) by mouth daily. 12/05/20   Sabino Dick, DO  folic acid (FOLVITE) 1 MG tablet Take 1 tablet (1 mg total) by mouth daily for supplement. 03/10/21   Bonnita Nasuti, AGNP-C  gabapentin (NEURONTIN) 100 MG capsule Take 1 capsule (100 mg total) by mouth 3 (three) times daily for neuropathy. 03/10/21   Bonnita Nasuti, AGNP-C  Multiple Vitamin (MULTIVITAMIN WITH MINERALS) TABS tablet Take 1 tablet by mouth daily.  12/05/20   Sabino Dick, DO  nicotine (NICODERM CQ - DOSED IN MG/24 HOURS) 21 mg/24hr patch Place 1 patch (21 mg total) onto the skin daily. 12/05/20   Sabino Dick, DO  nitrofurantoin, macrocrystal-monohydrate, (MACROBID) 100 MG capsule Take 1 capsule (100 mg total) by mouth 2 (two) times daily. 04/09/22   Ernie Avena, MD  polyethylene glycol (MIRALAX / GLYCOLAX) 17 g packet Take 17 g by mouth daily as needed for mild constipation. 12/05/20   Sabino Dick, DO  pravastatin (PRAVACHOL) 20 MG tablet Take 1 tablet (20 mg total) by mouth daily. 11/28/21     pravastatin (PRAVACHOL) 20 MG tablet Take 1 tablet (20 mg total) by mouth daily- NEEDS VISIT 03/06/22     senna (SENOKOT) 8.6 MG TABS tablet Take 2 tablets (17.2 mg total) by mouth daily. 12/07/20   Princess Bruins, DO  sertraline (ZOLOFT) 25 MG tablet Take 1 tablet (25 mg total) by mouth in the morning for depression. 03/10/21   Bonnita Nasuti, AGNP-C  tamsulosin (FLOMAX) 0.4 MG CAPS capsule Take 1 capsule (0.4 mg total) by mouth daily. 03/10/21   Bonnita Nasuti, AGNP-C  thiamine 100 MG tablet Take 1 tablet (100 mg total) by mouth daily. 12/05/20   Sabino Dick, DO      Allergies    Patient has no known allergies.    Review of Systems   Review of Systems  Constitutional:  Negative for fever.  Gastrointestinal:  Negative for abdominal pain.    Physical Exam Updated  Vital Signs BP (!) 151/67 (BP Location: Left Arm)   Pulse 64   Temp 98.3 F (36.8 C) (Oral)   Resp 17   Ht 1.753 m (5\' 9" )   Wt 98.9 kg   SpO2 97%   BMI 32.20 kg/m  Physical Exam Vitals and nursing note reviewed.  Constitutional:      Appearance: He is well-developed. He is not diaphoretic.  HENT:     Head: Normocephalic and atraumatic.  Eyes:     General:        Right eye: No discharge.        Left eye: No discharge.     Conjunctiva/sclera: Conjunctivae normal.  Pulmonary:     Effort: Pulmonary effort is normal. No respiratory  distress.  Abdominal:     Comments: Soft nontender abdomen  Genitourinary:    Comments: Uncircumcised, Foley catheter is in the urethra, there is no obvious drainage around it, there is minimal urine in the bag Musculoskeletal:     Right lower leg: Edema present.     Left lower leg: Edema present.  Skin:    General: Skin is warm and dry.     Findings: No erythema or rash.  Neurological:     Mental Status: He is alert.     Coordination: Coordination normal.     ED Results / Procedures / Treatments   Labs (all labs ordered are listed, but only abnormal results are displayed) Labs Reviewed - No data to display  EKG Edgar Mckay  Radiology No results found.  Procedures Procedures    Medications Ordered in ED Medications - No data to display  ED Course/ Medical Decision Making/ A&P                             Medical Decision Making  The patient has chronic edema of his legs, his vital signs are reassuring with only minimal hypertension, the reason for the visit and the functional problem here is that the catheter is not functioning appropriately whether it is not well-seated over the prostate or whether it is becoming full of sediment which I suspect given his history of recurrent problems such as this.  Will attempt to flush and if it does not flush we will replace, the patient is otherwise stable, no signs of UTI, no indication for testing for infection.  Stable for discharge        Final Clinical Impression(s) / ED Diagnoses Final diagnoses:  Foley catheter problem, initial encounter [facility])    Rx / DC Orders ED Discharge Orders     Edgar Mckay         Eber Hong, MD 05/08/22 [id number]

## 2022-05-08 NOTE — Discharge Instructions (Signed)
Follow-up with your doctor as needed, return to the ER for any severe or worsening symptoms, the Foley catheter seems to be working right at this time.  Please make sure you are drinking plenty of clear liquids

## 2022-05-17 ENCOUNTER — Other Ambulatory Visit (HOSPITAL_COMMUNITY): Payer: Self-pay

## 2022-05-23 ENCOUNTER — Emergency Department (HOSPITAL_COMMUNITY)
Admission: EM | Admit: 2022-05-23 | Discharge: 2022-05-23 | Disposition: A | Discharge status: Home / Self Care | Payer: Medicare Other | Attending: Emergency Medicine | Admitting: Emergency Medicine

## 2022-05-23 ENCOUNTER — Other Ambulatory Visit: Payer: Self-pay

## 2022-05-23 DIAGNOSIS — Y69 Unspecified misadventure during surgical and medical care: Secondary | ICD-10-CM | POA: Diagnosis not present

## 2022-05-23 DIAGNOSIS — T83098A Other mechanical complication of other indwelling urethral catheter, initial encounter: Secondary | ICD-10-CM | POA: Diagnosis present

## 2022-05-23 DIAGNOSIS — T839XXD Unspecified complication of genitourinary prosthetic device, implant and graft, subsequent encounter: Secondary | ICD-10-CM

## 2022-05-23 DIAGNOSIS — Z7982 Long term (current) use of aspirin: Secondary | ICD-10-CM | POA: Insufficient documentation

## 2022-05-23 NOTE — ED Provider Notes (Signed)
[place] EMERGENCY DEPARTMENT AT [facility] Provider Note   CSN: [id number] Arrival date & time: 05/23/22  4098     History  Chief Complaint  Patient presents with   Foley Catheter Problem     Edgar OBARA Sr. is a 87 y.o. male with medical history of chronic urinary retention with chronic Foley catheter in place.  Patient presents to the ED for evaluation of Foley catheter issues.  Patient states that around Saturday he began noticing that his Foley catheter was not draining properly.  Patient reports this is a recurrent problem for him.  He denies any other symptoms such as abdominal pain, fevers or chills, back pain, dysuria, penile discharge, nausea or vomiting.  This is the patient's eighth visit this year for such complaint.  HPI     Home Medications Prior to Admission medications   Medication Sig Start Date End Date Taking? Authorizing Provider  acetaminophen (TYLENOL) 325 MG tablet Take 2 tablets (650 mg total) by mouth every 6 (six) hours as needed. 12/05/20   Sabino Dick, DO  aspirin 81 MG chewable tablet Chew 1 tablet (81 mg total) by mouth daily. 03/10/21   Bonnita Nasuti, AGNP-C  atorvastatin (LIPITOR) 40 MG tablet Take 1 tablet (40 mg total) by mouth at bedtime. 12/08/20   Fayette Pho, MD  cyanocobalamin 1000 MCG tablet Take 1 tablet (1,000 mcg total) by mouth daily for Vitamin Supplement. 03/10/21   Bonnita Nasuti, AGNP-C  docusate sodium (COLACE) 100 MG capsule Take 1 capsule (100 mg total) by mouth daily for constipation. 03/10/21   Bonnita Nasuti, AGNP-C  finasteride (PROSCAR) 5 MG tablet Take 1 tablet (5 mg total) by mouth daily. 12/05/20   Sabino Dick, DO  folic acid (FOLVITE) 1 MG tablet Take 1 tablet (1 mg total) by mouth daily for supplement. 03/10/21   Bonnita Nasuti, AGNP-C  gabapentin (NEURONTIN) 100 MG capsule Take 1 capsule (100 mg total) by mouth 3 (three) times daily for neuropathy. 03/10/21   Bonnita Nasuti, AGNP-C   Multiple Vitamin (MULTIVITAMIN WITH MINERALS) TABS tablet Take 1 tablet by mouth daily. 12/05/20   Sabino Dick, DO  nicotine (NICODERM CQ - DOSED IN MG/24 HOURS) 21 mg/24hr patch Place 1 patch (21 mg total) onto the skin daily. 12/05/20   Sabino Dick, DO  nitrofurantoin, macrocrystal-monohydrate, (MACROBID) 100 MG capsule Take 1 capsule (100 mg total) by mouth 2 (two) times daily. 04/09/22   Ernie Avena, MD  polyethylene glycol (MIRALAX / GLYCOLAX) 17 g packet Take 17 g by mouth daily as needed for mild constipation. 12/05/20   Sabino Dick, DO  pravastatin (PRAVACHOL) 20 MG tablet Take 1 tablet (20 mg total) by mouth daily. 11/28/21     pravastatin (PRAVACHOL) 20 MG tablet Take 1 tablet (20 mg total) by mouth daily- NEEDS VISIT 03/06/22     senna (SENOKOT) 8.6 MG TABS tablet Take 2 tablets (17.2 mg total) by mouth daily. 12/07/20   Princess Bruins, DO  sertraline (ZOLOFT) 25 MG tablet Take 1 tablet (25 mg total) by mouth in the morning for depression. 03/10/21   Bonnita Nasuti, AGNP-C  tamsulosin (FLOMAX) 0.4 MG CAPS capsule Take 1 capsule (0.4 mg total) by mouth daily. 03/10/21   Bonnita Nasuti, AGNP-C  thiamine 100 MG tablet Take 1 tablet (100 mg total) by mouth daily. 12/05/20   Sabino Dick, DO      Allergies    Patient has no known allergies.    Review of Systems   Review of Systems  Gastrointestinal:  Negative for abdominal pain.  Genitourinary:  Positive for difficulty urinating. Negative for dysuria, flank pain, hematuria and penile discharge.  All other systems reviewed and are negative.   Physical Exam Updated Vital Signs BP (!) 129/58   Pulse (!) 57   Temp 98 F (36.7 C) (Oral)   Resp 15   SpO2 100%  Physical Exam Vitals and nursing note reviewed.  Constitutional:      General: He is not in acute distress.    Appearance: He is well-developed.  HENT:     Head: Normocephalic and atraumatic.  Eyes:     Conjunctiva/sclera: Conjunctivae  normal.  Cardiovascular:     Rate and Rhythm: Normal rate and regular rhythm.     Heart sounds: No murmur heard. Pulmonary:     Effort: Pulmonary effort is normal. No respiratory distress.     Breath sounds: Normal breath sounds.  Abdominal:     Palpations: Abdomen is soft.     Tenderness: There is no abdominal tenderness.     Comments: Soft abdomen  Genitourinary:    Comments: Foley catheter in urethra.  Uncircumcised.  No penile discharge.  No testicular tenderness.  Minimal urine in leg bag. Musculoskeletal:        General: No swelling.     Cervical back: Neck supple.  Skin:    General: Skin is warm and dry.     Capillary Refill: Capillary refill takes less than 2 seconds.  Neurological:     Mental Status: He is alert.  Psychiatric:        Mood and Affect: Mood normal.     ED Results / Procedures / Treatments   Labs (all labs ordered are listed, but only abnormal results are displayed) Labs Reviewed - No data to display  EKG None  Radiology No results found.  Procedures Procedures   Medications Ordered in ED Medications - No data to display  ED Course/ Medical Decision Making/ A&P  Medical Decision Making  87 year old presents to ED for evaluation.  Please see HPI for further details.  On examination the patient is in no apparent distress.  His abdomen is soft and nontender.  He has no flank pain bilaterally.  He is afebrile and nontachycardic.  His lung sounds are clear bilaterally.  Bladder scan performed by nursing staff shows urinary bladder with 46 cc of urine.  Nursing staff reports that they were able to successfully flush the patient bag.  They then exchanged the patient catheter bag.  No infectious signs or symptoms, patient denies nausea vomiting body aches or chills, fevers so no urinalysis will be collected.  At this time, patient Foley catheter bag is flushing properly.  The patient will be discharged home.  The patient be advised to follow-up  with urology.  He was advised to return to the ED with any new or worsening signs or symptoms.  We offered to show the patient how to flush his own catheter at home however he deferred on this.  Return precautions advised the patient voiced understanding.  Stable to discharge home.   Final Clinical Impression(s) / ED Diagnoses Final diagnoses:  Problem with Foley catheter, subsequent encounter    Rx / DC Orders ED Discharge Orders     None         Al Decant, PA-C 05/23/22 1610    Gwyneth Sprout, MD 05/24/22 1718

## 2022-05-23 NOTE — Discharge Instructions (Signed)
Return to the ED with any new or worsening signs or symptoms.  Please follow-up with the urology team or PCP for further management of your Foley catheter.  Please return with any fevers, nausea, vomiting, body aches or chills.

## 2022-05-23 NOTE — ED Triage Notes (Signed)
Patient requesting indwelling foley catheter change due to leaking around penis started last Saturday .

## 2022-05-23 NOTE — ED Notes (Signed)
Catheter flushed and new leg bag placed. No leaks around penis site noted. Will notify provider.

## 2022-05-23 NOTE — ED Notes (Signed)
Catheter is leaking around penis. Foley exchanged at this time. Draining appropriately to bag. Leg bag exchanged to go home with. RN notified pt son who is coming to get him.

## 2022-05-30 ENCOUNTER — Other Ambulatory Visit (HOSPITAL_COMMUNITY): Payer: Self-pay

## 2022-05-30 ENCOUNTER — Other Ambulatory Visit: Payer: Self-pay

## 2022-05-30 ENCOUNTER — Encounter (HOSPITAL_COMMUNITY): Payer: Self-pay | Admitting: Emergency Medicine

## 2022-05-30 ENCOUNTER — Emergency Department (HOSPITAL_COMMUNITY)
Admission: EM | Admit: 2022-05-30 | Discharge: 2022-05-30 | Disposition: A | Discharge status: Home / Self Care | Payer: Medicare Other | Attending: Emergency Medicine | Admitting: Emergency Medicine

## 2022-05-30 DIAGNOSIS — T83091A Other mechanical complication of indwelling urethral catheter, initial encounter: Secondary | ICD-10-CM | POA: Diagnosis present

## 2022-05-30 DIAGNOSIS — Z7982 Long term (current) use of aspirin: Secondary | ICD-10-CM | POA: Diagnosis not present

## 2022-05-30 DIAGNOSIS — Y732 Prosthetic and other implants, materials and accessory gastroenterology and urology devices associated with adverse incidents: Secondary | ICD-10-CM | POA: Insufficient documentation

## 2022-05-30 DIAGNOSIS — N390 Urinary tract infection, site not specified: Secondary | ICD-10-CM | POA: Insufficient documentation

## 2022-05-30 DIAGNOSIS — T839XXA Unspecified complication of genitourinary prosthetic device, implant and graft, initial encounter: Secondary | ICD-10-CM

## 2022-05-30 LAB — URINALYSIS, ROUTINE W REFLEX MICROSCOPIC
Bilirubin Urine: NEGATIVE
Glucose, UA: NEGATIVE mg/dL
Ketones, ur: NEGATIVE mg/dL
Nitrite: NEGATIVE
Protein, ur: 30 mg/dL — AB
Specific Gravity, Urine: 1.015 (ref 1.005–1.030)
WBC, UA: >50 WBC/hpf (ref 0–5)
pH: 7 (ref 5.0–8.0)

## 2022-05-30 MED ORDER — CEFDINIR 300 MG PO CAPS
300.0000 mg | ORAL_CAPSULE | Freq: Two times a day (BID) | ORAL | Status: DC
  Administered 2022-05-30: 300 mg via ORAL
  Filled 2022-05-30 (×2): qty 1

## 2022-05-30 MED ORDER — CEFDINIR 300 MG PO CAPS
300.0000 mg | ORAL_CAPSULE | Freq: Two times a day (BID) | ORAL | 0 refills | Status: AC
  Filled 2022-05-30: qty 20, 10d supply, fill #0

## 2022-05-30 MED ORDER — LIDOCAINE HCL URETHRAL/MUCOSAL 2 % EX GEL
1.0000 | Freq: Once | CUTANEOUS | Status: AC
  Administered 2022-05-30: 1 via TOPICAL
  Filled 2022-05-30: qty 11

## 2022-05-30 NOTE — ED Notes (Signed)
Got patient into a gown on the monitor patient is resting with call bell in reach 

## 2022-05-30 NOTE — ED Notes (Signed)
Alert, NAD, calm, interactive, resting in h/w stretcher.

## 2022-05-30 NOTE — ED Notes (Signed)
Reports urine leaking around 46fr tube. States, has had 16, 18 and 9fr foley tubes. Site CDI, unremarkable. Urine in leg bag  "is from yesterday". Brief slightly wet with urine likely from leaking. Denies pain, nausea, sob or dizziness. C/o "only some burning with urination".

## 2022-05-30 NOTE — ED Triage Notes (Signed)
Pt reports that his indwelling foley catheter is leaking when he attempts to urinate.

## 2022-05-30 NOTE — ED Notes (Signed)
Got patient into the bed on the monitor patient is resting with call bell in reach 

## 2022-05-30 NOTE — ED Notes (Signed)
Attempted to flush existing 45fr catheter, unable to flush, no return. EDP notified. Order received to place new foley, 10fr.

## 2022-05-30 NOTE — ED Provider Notes (Signed)
EMERGENCY DEPARTMENT AT [facility] Provider Note   CSN: [id number] Arrival date & time: 05/30/22  4098     History {Add pertinent medical, surgical, social history, OB history to HPI:1} No chief complaint on file.   Edgar Smoke Meno Sr. is a 87 y.o. male.  HPI     87 year old male with a history of chronic urinary retention with chronic Foley catheter in place  He has a history of recurrent episodes of Foley catheter dysfunction where the catheter becomes clogged or dysfunctional and he has leaking around it  He is leaking around 16 [place] tube.  He stated he had a 16, 18 and 20 [place] Foley tubes in the past.  Past Medical History:  Diagnosis Date   Urinary retention      Home Medications Prior to Admission medications   Medication Sig Start Date End Date Taking? Authorizing Provider  acetaminophen (TYLENOL) 325 MG tablet Take 2 tablets (650 mg total) by mouth every 6 (six) hours as needed. 12/05/20   Sabino Dick, DO  aspirin 81 MG chewable tablet Chew 1 tablet (81 mg total) by mouth daily. 03/10/21   Bonnita Nasuti, AGNP-C  atorvastatin (LIPITOR) 40 MG tablet Take 1 tablet (40 mg total) by mouth at bedtime. 12/08/20   Fayette Pho, MD  cyanocobalamin 1000 MCG tablet Take 1 tablet (1,000 mcg total) by mouth daily for Vitamin Supplement. 03/10/21   Bonnita Nasuti, AGNP-C  docusate sodium (COLACE) 100 MG capsule Take 1 capsule (100 mg total) by mouth daily for constipation. 03/10/21   Bonnita Nasuti, AGNP-C  finasteride (PROSCAR) 5 MG tablet Take 1 tablet (5 mg total) by mouth daily. 12/05/20   Sabino Dick, DO  folic acid (FOLVITE) 1 MG tablet Take 1 tablet (1 mg total) by mouth daily for supplement. 03/10/21   Bonnita Nasuti, AGNP-C  gabapentin (NEURONTIN) 100 MG capsule Take 1 capsule (100 mg total) by mouth 3 (three) times daily for neuropathy. 03/10/21   Bonnita Nasuti, AGNP-C  Multiple Vitamin (MULTIVITAMIN WITH MINERALS) TABS tablet  Take 1 tablet by mouth daily. 12/05/20   Sabino Dick, DO  nicotine (NICODERM CQ - DOSED IN MG/24 HOURS) 21 mg/24hr patch Place 1 patch (21 mg total) onto the skin daily. 12/05/20   Sabino Dick, DO  nitrofurantoin, macrocrystal-monohydrate, (MACROBID) 100 MG capsule Take 1 capsule (100 mg total) by mouth 2 (two) times daily. 04/09/22   Ernie Avena, MD  polyethylene glycol (MIRALAX / GLYCOLAX) 17 g packet Take 17 g by mouth daily as needed for mild constipation. 12/05/20   Sabino Dick, DO  pravastatin (PRAVACHOL) 20 MG tablet Take 1 tablet (20 mg total) by mouth daily. 11/28/21     pravastatin (PRAVACHOL) 20 MG tablet Take 1 tablet (20 mg total) by mouth daily- NEEDS VISIT 03/06/22     senna (SENOKOT) 8.6 MG TABS tablet Take 2 tablets (17.2 mg total) by mouth daily. 12/07/20   Princess Bruins, DO  sertraline (ZOLOFT) 25 MG tablet Take 1 tablet (25 mg total) by mouth in the morning for depression. 03/10/21   Bonnita Nasuti, AGNP-C  tamsulosin (FLOMAX) 0.4 MG CAPS capsule Take 1 capsule (0.4 mg total) by mouth daily. 03/10/21   Bonnita Nasuti, AGNP-C  thiamine 100 MG tablet Take 1 tablet (100 mg total) by mouth daily. 12/05/20   Sabino Dick, DO      Allergies    Patient has no known allergies.    Review of Systems   Review of Systems  Physical Exam Updated  Vital Signs BP (!) 141/69 (BP Location: Right Arm)   Pulse 64   Temp 98.3 F (36.8 C) (Oral)   Resp 19   SpO2 95%  Physical Exam  ED Results / Procedures / Treatments   Labs (all labs ordered are listed, but only abnormal results are displayed) Labs Reviewed - No data to display  EKG None  Radiology No results found.  Procedures Procedures  {Document cardiac monitor, telemetry assessment procedure when appropriate:1}  Medications Ordered in ED Medications - No data to display  ED Course/ Medical Decision Making/ A&P   {   Click here for ABCD2, HEART and other calculatorsREFRESH Note before  signing :1}                          Medical Decision Making  ***  {Document critical care time when appropriate:1} {Document review of labs and clinical decision tools ie heart score, Chads2Vasc2 etc:1}  {Document your independent review of radiology images, and any outside records:1} {Document your discussion with family members, caretakers, and with consultants:1} {Document social determinants of health affecting pt's care:1} {Document your decision making why or why not admission, treatments were needed:1} Final Clinical Impression(s) / ED Diagnoses Final diagnoses:  None    Rx / DC Orders ED Discharge Orders     None

## 2022-06-14 ENCOUNTER — Other Ambulatory Visit (HOSPITAL_COMMUNITY): Payer: Self-pay

## 2022-06-16 ENCOUNTER — Other Ambulatory Visit (HOSPITAL_COMMUNITY): Payer: Self-pay

## 2022-06-23 ENCOUNTER — Ambulatory Visit (INDEPENDENT_AMBULATORY_CARE_PROVIDER_SITE_OTHER): Payer: Medicare Other | Admitting: Podiatry

## 2022-06-23 DIAGNOSIS — M79674 Pain in right toe(s): Secondary | ICD-10-CM | POA: Diagnosis not present

## 2022-06-23 DIAGNOSIS — M79675 Pain in left toe(s): Secondary | ICD-10-CM

## 2022-06-23 DIAGNOSIS — B351 Tinea unguium: Secondary | ICD-10-CM | POA: Diagnosis not present

## 2022-06-23 NOTE — Progress Notes (Signed)
  Subjective:  Patient ID: Edgar DIEHL Sr., male    DOB: 1935-10-24,  MRN: [id number]  Chief Complaint  Patient presents with   Nail Problem    Routine foot care, nail trim     87 y.o. male presents with the above complaint. History confirmed with patient.  Nails become thickened and elongated again, regular intermittent debridements have been helpful for this  Objective:  Physical Exam: warm, good capillary refill, no trophic changes or ulcerative lesions, normal DP and PT pulses, and normal sensory exam. Left Foot: dystrophic yellowed discolored nail plates with subungual debris Right Foot: dystrophic yellowed discolored nail plates with subungual debris  Assessment:   1. Pain due to onychomycosis of toenails of both feet      Plan:  Patient was evaluated and treated and all questions answered.  Discussed the etiology and treatment options for the condition in detail with the patient.  Prior debridements helpful. Recommended debridement of the nails today. Sharp and mechanical debridement performed of all painful and mycotic nails today. Nails debrided in length and thickness using a nail nipper to level of comfort. Discussed treatment options including appropriate shoe gear. Follow up as needed for painful nails.    Return in about 4 months (around 10/23/2022) for painful thick fungal nails.

## 2022-07-04 ENCOUNTER — Other Ambulatory Visit (HOSPITAL_COMMUNITY): Payer: Self-pay

## 2022-08-25 ENCOUNTER — Emergency Department (HOSPITAL_COMMUNITY)
Admission: EM | Admit: 2022-08-25 | Discharge: 2022-08-25 | Disposition: A | Discharge status: Home / Self Care | Payer: Medicare Other | Attending: Emergency Medicine | Admitting: Emergency Medicine

## 2022-08-25 ENCOUNTER — Other Ambulatory Visit (HOSPITAL_COMMUNITY): Payer: Self-pay

## 2022-08-25 DIAGNOSIS — Z7982 Long term (current) use of aspirin: Secondary | ICD-10-CM | POA: Diagnosis not present

## 2022-08-25 DIAGNOSIS — T83511A Infection and inflammatory reaction due to indwelling urethral catheter, initial encounter: Secondary | ICD-10-CM | POA: Diagnosis not present

## 2022-08-25 DIAGNOSIS — R339 Retention of urine, unspecified: Secondary | ICD-10-CM | POA: Diagnosis present

## 2022-08-25 DIAGNOSIS — T839XXA Unspecified complication of genitourinary prosthetic device, implant and graft, initial encounter: Secondary | ICD-10-CM

## 2022-08-25 DIAGNOSIS — N390 Urinary tract infection, site not specified: Secondary | ICD-10-CM | POA: Diagnosis not present

## 2022-08-25 DIAGNOSIS — Y732 Prosthetic and other implants, materials and accessory gastroenterology and urology devices associated with adverse incidents: Secondary | ICD-10-CM | POA: Insufficient documentation

## 2022-08-25 LAB — URINALYSIS, ROUTINE W REFLEX MICROSCOPIC
Bilirubin Urine: NEGATIVE
Glucose, UA: NEGATIVE mg/dL
Ketones, ur: NEGATIVE mg/dL
Nitrite: POSITIVE — AB
Protein, ur: 30 mg/dL — AB
Specific Gravity, Urine: 1.018 (ref 1.005–1.030)
WBC, UA: >50 WBC/hpf (ref 0–5)
pH: 5 (ref 5.0–8.0)

## 2022-08-25 MED ORDER — CIPROFLOXACIN HCL 500 MG PO TABS
500.0000 mg | ORAL_TABLET | Freq: Two times a day (BID) | ORAL | 0 refills | Status: DC
  Filled 2022-08-25: qty 14, 7d supply, fill #0

## 2022-08-25 MED ORDER — CIPROFLOXACIN HCL 500 MG PO TABS
500.0000 mg | ORAL_TABLET | Freq: Once | ORAL | Status: AC
  Administered 2022-08-25: 500 mg via ORAL
  Filled 2022-08-25: qty 1

## 2022-08-25 NOTE — ED Triage Notes (Signed)
Pt states that yesterday he began having issues with his foley catheter not producing urine and him therefore urinating around it. Pt denies pain. States that this has happened to him in the past when the catheter gets clogged and he needs either a replacement or flushing it out. Currently pt has 16 fr foley in place.

## 2022-08-25 NOTE — ED Provider Notes (Signed)
[place] EMERGENCY DEPARTMENT AT [facility] Provider Note   CSN: [id number] Arrival date & time: 08/25/22  0720     History  Chief Complaint  Patient presents with   Catheter issue    Edgar Boom Sr. is a 87 y.o. male.  Pt is a 87 yo male with pmhx significant for urinary retention, BPH, and depression.  Pt said he had his foley changed on 8/12 and he's had leakage around his penis since then.  He denies any other sx and otherwise feels fine.  This was a routing FC change.  He said it is changed every 2 weeks.       Home Medications Prior to Admission medications   Medication Sig Start Date End Date Taking? Authorizing Provider  ciprofloxacin (CIPRO) 500 MG tablet Take 1 tablet (500 mg total) by mouth 2 (two) times daily. 08/25/22  Yes Jacalyn Lefevre, MD  acetaminophen (TYLENOL) 325 MG tablet Take 2 tablets (650 mg total) by mouth every 6 (six) hours as needed. 12/05/20   Sabino Dick, DO  aspirin 81 MG chewable tablet Chew 1 tablet (81 mg total) by mouth daily. 03/10/21   Bonnita Nasuti, AGNP-C  atorvastatin (LIPITOR) 40 MG tablet Take 1 tablet (40 mg total) by mouth at bedtime. 12/08/20   Valetta Close, MD  cyanocobalamin 1000 MCG tablet Take 1 tablet (1,000 mcg total) by mouth daily for Vitamin Supplement. 03/10/21   Bonnita Nasuti, AGNP-C  docusate sodium (COLACE) 100 MG capsule Take 1 capsule (100 mg total) by mouth daily for constipation. 03/10/21   Bonnita Nasuti, AGNP-C  finasteride (PROSCAR) 5 MG tablet Take 1 tablet (5 mg total) by mouth daily. 12/05/20   Sabino Dick, DO  folic acid (FOLVITE) 1 MG tablet Take 1 tablet (1 mg total) by mouth daily for supplement. 03/10/21   Bonnita Nasuti, AGNP-C  gabapentin (NEURONTIN) 100 MG capsule Take 1 capsule (100 mg total) by mouth 3 (three) times daily for neuropathy. 03/10/21   Bonnita Nasuti, AGNP-C  Multiple Vitamin (MULTIVITAMIN WITH MINERALS) TABS tablet Take 1 tablet by mouth daily.  12/05/20   Sabino Dick, DO  nicotine (NICODERM CQ - DOSED IN MG/24 HOURS) 21 mg/24hr patch Place 1 patch (21 mg total) onto the skin daily. 12/05/20   Sabino Dick, DO  nitrofurantoin, macrocrystal-monohydrate, (MACROBID) 100 MG capsule Take 1 capsule (100 mg total) by mouth 2 (two) times daily. 04/09/22   Ernie Avena, MD  polyethylene glycol (MIRALAX / GLYCOLAX) 17 g packet Take 17 g by mouth daily as needed for mild constipation. 12/05/20   Sabino Dick, DO  pravastatin (PRAVACHOL) 20 MG tablet Take 1 tablet (20 mg total) by mouth daily. 11/28/21     pravastatin (PRAVACHOL) 20 MG tablet Take 1 tablet (20 mg total) by mouth daily- NEEDS VISIT 03/06/22     senna (SENOKOT) 8.6 MG TABS tablet Take 2 tablets (17.2 mg total) by mouth daily. 12/07/20   Princess Bruins, DO  sertraline (ZOLOFT) 25 MG tablet Take 1 tablet (25 mg total) by mouth in the morning for depression. 03/10/21   Bonnita Nasuti, AGNP-C  tamsulosin (FLOMAX) 0.4 MG CAPS capsule Take 1 capsule (0.4 mg total) by mouth daily. 03/10/21   Bonnita Nasuti, AGNP-C  thiamine 100 MG tablet Take 1 tablet (100 mg total) by mouth daily. 12/05/20   Sabino Dick, DO      Allergies    Patient has no known allergies.    Review of Systems   Review of Systems  Genitourinary:        Leakage around fc  All other systems reviewed and are negative.   Physical Exam Updated Vital Signs BP (!) 155/78 (BP Location: Left Arm)   Pulse 63   Temp 97.8 F (36.6 C) (Oral)   Resp 18   SpO2 99%  Physical Exam Vitals and nursing note reviewed.  Constitutional:      Appearance: Normal appearance. He is obese.  HENT:     Head: Normocephalic and atraumatic.     Right Ear: External ear normal.     Left Ear: External ear normal.     Nose: Nose normal.     Mouth/Throat:     Mouth: Mucous membranes are moist.     Pharynx: Oropharynx is clear.  Eyes:     Extraocular Movements: Extraocular movements intact.      Conjunctiva/sclera: Conjunctivae normal.     Pupils: Pupils are equal, round, and reactive to light.  Cardiovascular:     Rate and Rhythm: Normal rate and regular rhythm.     Pulses: Normal pulses.     Heart sounds: Normal heart sounds.  Pulmonary:     Effort: Pulmonary effort is normal.     Breath sounds: Normal breath sounds.  Abdominal:     General: Abdomen is flat. Bowel sounds are normal.     Palpations: Abdomen is soft.  Genitourinary:    Comments: Penile erosions from chronic fc use Musculoskeletal:        General: Normal range of motion.     Cervical back: Normal range of motion and neck supple.  Skin:    General: Skin is warm.     Capillary Refill: Capillary refill takes less than 2 seconds.  Neurological:     General: No focal deficit present.     Mental Status: He is alert and oriented to person, place, and time.  Psychiatric:        Mood and Affect: Mood normal.        Behavior: Behavior normal.     ED Results / Procedures / Treatments   Labs (all labs ordered are listed, but only abnormal results are displayed) Labs Reviewed  URINALYSIS, ROUTINE W REFLEX MICROSCOPIC - Abnormal; Notable for the following components:      Result Value   APPearance HAZY (*)    Hgb urine dipstick MODERATE (*)    Protein, ur 30 (*)    Nitrite POSITIVE (*)    Leukocytes,Ua MODERATE (*)    Bacteria, UA MANY (*)    All other components within normal limits  URINE CULTURE    EKG None  Radiology No results found.  Procedures Procedures    Medications Ordered in ED Medications  ciprofloxacin (CIPRO) tablet 500 mg (has no administration in time range)    ED Course/ Medical Decision Making/ A&P                                 Medical Decision Making Amount and/or Complexity of Data Reviewed Labs: ordered.   This patient presents to the ED for concern of foley leakage, this involves an extensive number of treatment options, and is a complaint that carries with it a  high risk of complications and morbidity.  The differential diagnosis includes catheter dysfunction   Co morbidities that complicate the patient evaluation  urinary retention, BPH, and depression   Additional history obtained:  Additional history obtained from epic chart review  External records from outside source obtained and reviewed including son   Lab Tests:  I Ordered, and personally interpreted labs.  The pertinent results include:  ua + for uti   Cardiac Monitoring:  The patient was maintained on a cardiac monitor.  I personally viewed and interpreted the cardiac monitored which showed an underlying rhythm of: nsr   Medicines ordered and prescription drug management:  I ordered medication including cipro  for uti  Reevaluation of the patient after these medicines showed that the patient improved I have reviewed the patients home medicines and have made adjustments as needed   Problem List / ED Course:  Foley catheter problem:  catheter changed.  Urine sent from [facility].  UA + for uti.  Urine sent for cx.  Pt put on cipro.  Pt is stable for d/c.  Return if worse.    Reevaluation:  After the interventions noted above, I reevaluated the patient and found that they have :improved   Social Determinants of Health:  Lives at home   Dispostion:  After consideration of the diagnostic results and the patients response to treatment, I feel that the patent would benefit from discharge with outpatient f/u.          Final Clinical Impression(s) / ED Diagnoses Final diagnoses:  Urinary tract infection associated with indwelling urethral catheter, initial encounter (HCC)  Problem with Foley catheter, initial encounter (HCC)    Rx / DC Orders ED Discharge Orders          Ordered    ciprofloxacin (CIPRO) 500 MG tablet  2 times daily        08/25/22 0904              Jacalyn Lefevre, MD 08/25/22 [phone]

## 2022-08-27 LAB — URINE CULTURE: Culture: >=100000 — AB

## 2022-08-28 ENCOUNTER — Telehealth (HOSPITAL_BASED_OUTPATIENT_CLINIC_OR_DEPARTMENT_OTHER): Payer: Self-pay | Admitting: *Deleted

## 2022-08-28 NOTE — Telephone Encounter (Signed)
Post ED Visit - Positive Culture Follow-up  Culture report reviewed by antimicrobial stewardship pharmacist: [facility] []  Enzo Bi, Pharm.D. []  Celedonio Miyamoto, Pharm.D., BCPS AQ-ID []  Garvin Fila, Pharm.D., BCPS []  Georgina Pillion, Pharm.D., BCPS []  [place], [address].D., BCPS, AAHIVP []  Estella Husk, Pharm.D., BCPS, AAHIVP []  Lysle Pearl, PharmD, BCPS []  Phillips Climes, PharmD, BCPS []  Agapito Games, PharmD, BCPS []  Verlan Friends, PharmD []  Mervyn Gay, PharmD, BCPS []  Vinnie Level, PharmD  [facility] []  Len Childs, PharmD []  Greer Pickerel, PharmD [x]  Adalberto Cole, PharmD []  Perlie Gold, Rph []  Lonell Face) Jean Rosenthal, PharmD []  Earl Many, PharmD []  Junita Push, PharmD []  Dorna Leitz, PharmD []  Terrilee Files, PharmD []  Lynann Beaver, PharmD []  Keturah Barre, PharmD []  Loralee Pacas, PharmD []  Bernadene Person, PharmD   Positive urine culture Treated with Ciprofloxacin, organism sensitive to the same and no further patient follow-up is required at this time.  Edgar Mckay 08/28/2022, 2:56 PM

## 2022-09-30 ENCOUNTER — Encounter (HOSPITAL_COMMUNITY): Payer: Self-pay

## 2022-09-30 ENCOUNTER — Other Ambulatory Visit: Payer: Self-pay

## 2022-09-30 ENCOUNTER — Emergency Department (HOSPITAL_COMMUNITY)
Admission: EM | Admit: 2022-09-30 | Discharge: 2022-09-30 | Disposition: A | Discharge status: Home / Self Care | Payer: Medicare Other | Attending: Emergency Medicine | Admitting: Emergency Medicine

## 2022-09-30 DIAGNOSIS — T839XXA Unspecified complication of genitourinary prosthetic device, implant and graft, initial encounter: Secondary | ICD-10-CM | POA: Diagnosis present

## 2022-09-30 DIAGNOSIS — Z7982 Long term (current) use of aspirin: Secondary | ICD-10-CM | POA: Insufficient documentation

## 2022-09-30 NOTE — ED Notes (Signed)
Got patient undressed on the monitor patient is resting with call bell in reach

## 2022-09-30 NOTE — Discharge Instructions (Signed)
Your Foley catheter was flushed and appears to be working well.  Follow-up with urology.  Return to the ER if you develop any other new or worsening symptoms.

## 2022-09-30 NOTE — ED Provider Notes (Signed)
[place] EMERGENCY DEPARTMENT AT [facility] Provider Note   CSN: [id number] Arrival date & time: 09/30/22  9528     History  Chief Complaint  Patient presents with   catheter leaking    Edgar Smoke Antonellis Sr. is a 87 y.o. male.  HPI 87 year old male with a history of chronic Foley catheter use presents with a leaking Foley catheter.  He states his catheter was changed yesterday and would seem to be working fine until it started leaking last night.  This is a recurrent problem for him.  He is asking that we try and flush it first but knows it may needs to be changed.  He states it feels like there is some irritation on his penis like he has a cut, otherwise denies any abdominal pain.  Home Medications Prior to Admission medications   Medication Sig Start Date End Date Taking? Authorizing Provider  acetaminophen (TYLENOL) 325 MG tablet Take 2 tablets (650 mg total) by mouth every 6 (six) hours as needed. 12/05/20   Sabino Dick, DO  aspirin 81 MG chewable tablet Chew 1 tablet (81 mg total) by mouth daily. 03/10/21   Bonnita Nasuti, AGNP-C  atorvastatin (LIPITOR) 40 MG tablet Take 1 tablet (40 mg total) by mouth at bedtime. 12/08/20   Valetta Close, MD  ciprofloxacin (CIPRO) 500 MG tablet Take 1 tablet (500 mg total) by mouth 2 (two) times daily. 08/25/22   Jacalyn Lefevre, MD  cyanocobalamin 1000 MCG tablet Take 1 tablet (1,000 mcg total) by mouth daily for Vitamin Supplement. 03/10/21   Bonnita Nasuti, AGNP-C  docusate sodium (COLACE) 100 MG capsule Take 1 capsule (100 mg total) by mouth daily for constipation. 03/10/21   Bonnita Nasuti, AGNP-C  finasteride (PROSCAR) 5 MG tablet Take 1 tablet (5 mg total) by mouth daily. 12/05/20   Sabino Dick, DO  folic acid (FOLVITE) 1 MG tablet Take 1 tablet (1 mg total) by mouth daily for supplement. 03/10/21   Bonnita Nasuti, AGNP-C  gabapentin (NEURONTIN) 100 MG capsule Take 1 capsule (100 mg total) by mouth 3 (three)  times daily for neuropathy. 03/10/21   Bonnita Nasuti, AGNP-C  Multiple Vitamin (MULTIVITAMIN WITH MINERALS) TABS tablet Take 1 tablet by mouth daily. 12/05/20   Sabino Dick, DO  nicotine (NICODERM CQ - DOSED IN MG/24 HOURS) 21 mg/24hr patch Place 1 patch (21 mg total) onto the skin daily. 12/05/20   Sabino Dick, DO  nitrofurantoin, macrocrystal-monohydrate, (MACROBID) 100 MG capsule Take 1 capsule (100 mg total) by mouth 2 (two) times daily. 04/09/22   Ernie Avena, MD  polyethylene glycol (MIRALAX / GLYCOLAX) 17 g packet Take 17 g by mouth daily as needed for mild constipation. 12/05/20   Sabino Dick, DO  pravastatin (PRAVACHOL) 20 MG tablet Take 1 tablet (20 mg total) by mouth daily. 11/28/21     pravastatin (PRAVACHOL) 20 MG tablet Take 1 tablet (20 mg total) by mouth daily- NEEDS VISIT 03/06/22     senna (SENOKOT) 8.6 MG TABS tablet Take 2 tablets (17.2 mg total) by mouth daily. 12/07/20   Princess Bruins, DO  sertraline (ZOLOFT) 25 MG tablet Take 1 tablet (25 mg total) by mouth in the morning for depression. 03/10/21   Bonnita Nasuti, AGNP-C  tamsulosin (FLOMAX) 0.4 MG CAPS capsule Take 1 capsule (0.4 mg total) by mouth daily. 03/10/21   Bonnita Nasuti, AGNP-C  thiamine 100 MG tablet Take 1 tablet (100 mg total) by mouth daily. 12/05/20   Sabino Dick, DO  Allergies    Patient has no known allergies.    Review of Systems   Review of Systems  Gastrointestinal:  Negative for abdominal pain.  Genitourinary:  Positive for difficulty urinating.    Physical Exam Updated Vital Signs BP 127/69   Pulse (!) 55   Temp 98.3 F (36.8 C)   Resp 17   SpO2 96%  Physical Exam Vitals and nursing note reviewed.  Constitutional:      Appearance: He is well-developed.  HENT:     Head: Normocephalic and atraumatic.  Pulmonary:     Effort: Pulmonary effort is normal.  Abdominal:     Palpations: Abdomen is soft.     Tenderness: There is no abdominal tenderness.   Genitourinary:    Comments: Foley catheter in place. No penile tenderness or obvious wounds/lesions Skin:    General: Skin is warm and dry.  Neurological:     Mental Status: He is alert.     ED Results / Procedures / Treatments   Labs (all labs ordered are listed, but only abnormal results are displayed) Labs Reviewed - No data to display  EKG None  Radiology No results found.  Procedures Procedures    Medications Ordered in ED Medications - No data to display  ED Course/ Medical Decision Making/ A&P                                 Medical Decision Making Amount and/or Complexity of Data Reviewed External Data Reviewed: notes.   There has been no signs of leakage in the emergency department.  The patient's catheter was flushed and a new bag was applied and he is making urine and there seems to be no overflow.  The mild discomfort he was describing is also gone.  Low suspicion for UTI given he is essentially now asymptomatic.  Catheter seems to be working, so will discharge home to follow-up with urology as needed.        Final Clinical Impression(s) / ED Diagnoses Final diagnoses:  Foley catheter problem, initial encounter [facility])    Rx / DC Orders ED Discharge Orders     None         Pricilla Loveless, MD 09/30/22 1024

## 2022-09-30 NOTE — ED Triage Notes (Addendum)
Pt. Stated, They put a new catheter in yesterday  and its leaking really bad.

## 2022-10-17 ENCOUNTER — Emergency Department (HOSPITAL_COMMUNITY)
Admission: EM | Admit: 2022-10-17 | Discharge: 2022-10-18 | Disposition: A | Discharge status: Home / Self Care | Payer: 59 | Source: Home / Self Care | Attending: Emergency Medicine | Admitting: Emergency Medicine

## 2022-10-17 ENCOUNTER — Other Ambulatory Visit: Payer: Self-pay

## 2022-10-17 ENCOUNTER — Encounter (HOSPITAL_COMMUNITY): Payer: Self-pay

## 2022-10-17 ENCOUNTER — Emergency Department (HOSPITAL_COMMUNITY)
Admission: EM | Admit: 2022-10-17 | Discharge: 2022-10-17 | Disposition: A | Discharge status: Home / Self Care | Payer: 59 | Attending: Emergency Medicine | Admitting: Emergency Medicine

## 2022-10-17 DIAGNOSIS — Z7982 Long term (current) use of aspirin: Secondary | ICD-10-CM | POA: Insufficient documentation

## 2022-10-17 DIAGNOSIS — T839XXA Unspecified complication of genitourinary prosthetic device, implant and graft, initial encounter: Secondary | ICD-10-CM | POA: Insufficient documentation

## 2022-10-17 DIAGNOSIS — Y732 Prosthetic and other implants, materials and accessory gastroenterology and urology devices associated with adverse incidents: Secondary | ICD-10-CM | POA: Insufficient documentation

## 2022-10-17 DIAGNOSIS — Z978 Presence of other specified devices: Secondary | ICD-10-CM | POA: Diagnosis not present

## 2022-10-17 NOTE — ED Triage Notes (Signed)
Pt states when he urinates, he is urinating around the catheter in penis

## 2022-10-17 NOTE — Discharge Instructions (Addendum)
New Foley catheter has been placed.

## 2022-10-17 NOTE — ED Triage Notes (Signed)
Pt reports his catheter is leaking. States had it  changed this morning at [facility] and still leaking. Denies pain or any other symptoms

## 2022-10-17 NOTE — ED Provider Notes (Signed)
[place] EMERGENCY DEPARTMENT AT [facility] Provider Note   CSN: [id number] Arrival date & time: 10/17/22  4098     History  Chief Complaint  Patient presents with   catheter leaking    Edgar Smoke Rack Sr. is a 87 y.o. male.  HPI    87 year old male comes in with chief complaint of Foley catheter issue.  Patient has history of neurogenic bladder and has chronic Foley catheter.  He states that occasionally it gets clogged and he starts having urine leaking around the Foley catheter.  That started again last night.  Patient does not want the ED to try and flush the Foley catheter, he wants his exchanged because in the past flushing has not led to long-term relief.  His Foley catheter is typically changed every 3 to 4 weeks, last change was 2 weeks ago.  Home Medications Prior to Admission medications   Medication Sig Start Date End Date Taking? Authorizing Provider  acetaminophen (TYLENOL) 325 MG tablet Take 2 tablets (650 mg total) by mouth every 6 (six) hours as needed. 12/05/20   Sabino Dick, DO  aspirin 81 MG chewable tablet Chew 1 tablet (81 mg total) by mouth daily. 03/10/21   Bonnita Nasuti, AGNP-C  atorvastatin (LIPITOR) 40 MG tablet Take 1 tablet (40 mg total) by mouth at bedtime. 12/08/20   Valetta Close, MD  ciprofloxacin (CIPRO) 500 MG tablet Take 1 tablet (500 mg total) by mouth 2 (two) times daily. 08/25/22   Jacalyn Lefevre, MD  cyanocobalamin 1000 MCG tablet Take 1 tablet (1,000 mcg total) by mouth daily for Vitamin Supplement. 03/10/21   Bonnita Nasuti, AGNP-C  docusate sodium (COLACE) 100 MG capsule Take 1 capsule (100 mg total) by mouth daily for constipation. 03/10/21   Bonnita Nasuti, AGNP-C  finasteride (PROSCAR) 5 MG tablet Take 1 tablet (5 mg total) by mouth daily. 12/05/20   Sabino Dick, DO  folic acid (FOLVITE) 1 MG tablet Take 1 tablet (1 mg total) by mouth daily for supplement. 03/10/21   Bonnita Nasuti, AGNP-C  gabapentin  (NEURONTIN) 100 MG capsule Take 1 capsule (100 mg total) by mouth 3 (three) times daily for neuropathy. 03/10/21   Bonnita Nasuti, AGNP-C  Multiple Vitamin (MULTIVITAMIN WITH MINERALS) TABS tablet Take 1 tablet by mouth daily. 12/05/20   Sabino Dick, DO  nicotine (NICODERM CQ - DOSED IN MG/24 HOURS) 21 mg/24hr patch Place 1 patch (21 mg total) onto the skin daily. 12/05/20   Sabino Dick, DO  nitrofurantoin, macrocrystal-monohydrate, (MACROBID) 100 MG capsule Take 1 capsule (100 mg total) by mouth 2 (two) times daily. 04/09/22   Ernie Avena, MD  polyethylene glycol (MIRALAX / GLYCOLAX) 17 g packet Take 17 g by mouth daily as needed for mild constipation. 12/05/20   Sabino Dick, DO  pravastatin (PRAVACHOL) 20 MG tablet Take 1 tablet (20 mg total) by mouth daily. 11/28/21     pravastatin (PRAVACHOL) 20 MG tablet Take 1 tablet (20 mg total) by mouth daily- NEEDS VISIT 03/06/22     senna (SENOKOT) 8.6 MG TABS tablet Take 2 tablets (17.2 mg total) by mouth daily. 12/07/20   Princess Bruins, DO  sertraline (ZOLOFT) 25 MG tablet Take 1 tablet (25 mg total) by mouth in the morning for depression. 03/10/21   Bonnita Nasuti, AGNP-C  tamsulosin (FLOMAX) 0.4 MG CAPS capsule Take 1 capsule (0.4 mg total) by mouth daily. 03/10/21   Bonnita Nasuti, AGNP-C  thiamine 100 MG tablet Take 1 tablet (100 mg total) by mouth  daily. 12/05/20   Sabino Dick, DO      Allergies    Patient has no known allergies.    Review of Systems   Review of Systems  All other systems reviewed and are negative.   Physical Exam Updated Vital Signs BP 126/66   Pulse 70   Temp 98.3 F (36.8 C) (Oral)   Resp 16   Ht 5\' 9"  (1.753 m)   Wt 98.9 kg   SpO2 99%   BMI 32.20 kg/m  Physical Exam Vitals and nursing note reviewed.  Constitutional:      Appearance: He is well-developed.  HENT:     Head: Atraumatic.  Eyes:     Extraocular Movements: Extraocular movements intact.     Pupils: Pupils are  equal, round, and reactive to light.  Cardiovascular:     Rate and Rhythm: Normal rate.  Pulmonary:     Effort: Pulmonary effort is normal.  Musculoskeletal:     Cervical back: Neck supple.  Skin:    General: Skin is warm.  Neurological:     Mental Status: He is alert and oriented to person, place, and time.     ED Results / Procedures / Treatments   Labs (all labs ordered are listed, but only abnormal results are displayed) Labs Reviewed - No data to display  EKG None  Radiology No results found.  Procedures Procedures    Medications Ordered in ED Medications - No data to display  ED Course/ Medical Decision Making/ A&P                                 Medical Decision Making  87 year old patient comes in with chief complaint of Foley catheter issues.  He has history of neurogenic bladder and chronic indwelling Foley catheter.  It appears that he is having urinary retention because of Foley catheter being clogged.  Patient has no abdominal pain, back pain, fevers, chills, nausea, vomiting.  Differential diagnosis considered for this patient includes UTI, gross hematuria, mechanical obstruction of the Foley catheter.  We are favoring the latter, in the setting of patient having no urinary symptoms and previous history of similar issues.  Plan is to place a new Foley catheter and we can discharge the patient.   Final Clinical Impression(s) / ED Diagnoses Final diagnoses:  Chronic indwelling Foley catheter  Complication of Foley catheter, initial encounter [facility])    Rx / DC Orders ED Discharge Orders     None         Derwood Kaplan, MD 10/17/22 [id number]

## 2022-10-17 NOTE — ED Provider Notes (Signed)
[place] EMERGENCY DEPARTMENT AT [facility] Provider Note   CSN: [id number] Arrival date & time: 10/17/22  1423     History {Add pertinent medical, surgical, social history, OB history to HPI:1}  CC:  foley catheter problem   Edgar KARWOWSKI Sr. is a 87 y.o. male.  The history is provided by the patient and medical records.   87 year old male with history of urinary retention, presenting to the ED with difficulty with his Foley catheter.  States he had it exchanged this morning at [facility], states it stopped draining again around 1 PM today.  He did have a small amount of leakage around the catheter itself.  He has not had any bleeding.  States he feels incredible urge to urinate but no urine is draining.  Home Medications Prior to Admission medications   Medication Sig Start Date End Date Taking? Authorizing Provider  acetaminophen (TYLENOL) 325 MG tablet Take 2 tablets (650 mg total) by mouth every 6 (six) hours as needed. 12/05/20   Sabino Dick, DO  aspirin 81 MG chewable tablet Chew 1 tablet (81 mg total) by mouth daily. 03/10/21   Bonnita Nasuti, AGNP-C  atorvastatin (LIPITOR) 40 MG tablet Take 1 tablet (40 mg total) by mouth at bedtime. 12/08/20   Valetta Close, MD  ciprofloxacin (CIPRO) 500 MG tablet Take 1 tablet (500 mg total) by mouth 2 (two) times daily. 08/25/22   Jacalyn Lefevre, MD  cyanocobalamin 1000 MCG tablet Take 1 tablet (1,000 mcg total) by mouth daily for Vitamin Supplement. 03/10/21   Bonnita Nasuti, AGNP-C  docusate sodium (COLACE) 100 MG capsule Take 1 capsule (100 mg total) by mouth daily for constipation. 03/10/21   Bonnita Nasuti, AGNP-C  finasteride (PROSCAR) 5 MG tablet Take 1 tablet (5 mg total) by mouth daily. 12/05/20   Sabino Dick, DO  folic acid (FOLVITE) 1 MG tablet Take 1 tablet (1 mg total) by mouth daily for supplement. 03/10/21   Bonnita Nasuti, AGNP-C  gabapentin (NEURONTIN) 100 MG capsule Take 1 capsule (100 mg  total) by mouth 3 (three) times daily for neuropathy. 03/10/21   Bonnita Nasuti, AGNP-C  Multiple Vitamin (MULTIVITAMIN WITH MINERALS) TABS tablet Take 1 tablet by mouth daily. 12/05/20   Sabino Dick, DO  nicotine (NICODERM CQ - DOSED IN MG/24 HOURS) 21 mg/24hr patch Place 1 patch (21 mg total) onto the skin daily. 12/05/20   Sabino Dick, DO  nitrofurantoin, macrocrystal-monohydrate, (MACROBID) 100 MG capsule Take 1 capsule (100 mg total) by mouth 2 (two) times daily. 04/09/22   Ernie Avena, MD  polyethylene glycol (MIRALAX / GLYCOLAX) 17 g packet Take 17 g by mouth daily as needed for mild constipation. 12/05/20   Sabino Dick, DO  pravastatin (PRAVACHOL) 20 MG tablet Take 1 tablet (20 mg total) by mouth daily. 11/28/21     pravastatin (PRAVACHOL) 20 MG tablet Take 1 tablet (20 mg total) by mouth daily- NEEDS VISIT 03/06/22     senna (SENOKOT) 8.6 MG TABS tablet Take 2 tablets (17.2 mg total) by mouth daily. 12/07/20   Princess Bruins, DO  sertraline (ZOLOFT) 25 MG tablet Take 1 tablet (25 mg total) by mouth in the morning for depression. 03/10/21   Bonnita Nasuti, AGNP-C  tamsulosin (FLOMAX) 0.4 MG CAPS capsule Take 1 capsule (0.4 mg total) by mouth daily. 03/10/21   Bonnita Nasuti, AGNP-C  thiamine 100 MG tablet Take 1 tablet (100 mg total) by mouth daily. 12/05/20   Sabino Dick, DO  Allergies    Patient has no known allergies.    Review of Systems   Review of Systems  Genitourinary:  Positive for decreased urine volume.  All other systems reviewed and are negative.   Physical Exam Updated Vital Signs BP 135/78   Pulse 64   Temp 98.8 F (37.1 C) (Oral)   Resp 16   SpO2 98%   Physical Exam Vitals and nursing note reviewed.  Constitutional:      Appearance: He is well-developed.  HENT:     Head: Normocephalic and atraumatic.  Eyes:     Conjunctiva/sclera: Conjunctivae normal.     Pupils: Pupils are equal, round, and reactive to light.   Cardiovascular:     Rate and Rhythm: Normal rate and regular rhythm.     Heart sounds: Normal heart sounds.  Pulmonary:     Effort: Pulmonary effort is normal.     Breath sounds: Normal breath sounds.  Abdominal:     General: Bowel sounds are normal.     Palpations: Abdomen is soft.     Comments: Fullness over bladder  Genitourinary:    Comments: Foley in place, minimal urine in leg bag Musculoskeletal:        General: Normal range of motion.     Cervical back: Normal range of motion.  Skin:    General: Skin is warm and dry.  Neurological:     Mental Status: He is alert and oriented to person, place, and time.     ED Results / Procedures / Treatments   Labs (all labs ordered are listed, but only abnormal results are displayed) Labs Reviewed - No data to display  EKG None  Radiology No results found.  Procedures Procedures  {Document cardiac monitor, telemetry assessment procedure when appropriate:1}  Medications Ordered in ED Medications - No data to display  ED Course/ Medical Decision Making/ A&P   {   Click here for ABCD2, HEART and other calculatorsREFRESH Note before signing :1}                              Medical Decision Making  ***  {Document critical care time when appropriate:1} {Document review of labs and clinical decision tools ie heart score, Chads2Vasc2 etc:1}  {Document your independent review of radiology images, and any outside records:1} {Document your discussion with family members, caretakers, and with consultants:1} {Document social determinants of health affecting pt's care:1} {Document your decision making why or why not admission, treatments were needed:1} Final Clinical Impression(s) / ED Diagnoses Final diagnoses:  None    Rx / DC Orders ED Discharge Orders     None

## 2022-10-18 ENCOUNTER — Encounter (HOSPITAL_COMMUNITY): Payer: Self-pay

## 2022-10-18 ENCOUNTER — Emergency Department (HOSPITAL_COMMUNITY)
Admission: EM | Admit: 2022-10-18 | Discharge: 2022-10-18 | Disposition: A | Discharge status: Home / Self Care | Payer: 59 | Attending: Student | Admitting: Student

## 2022-10-18 ENCOUNTER — Other Ambulatory Visit: Payer: Self-pay

## 2022-10-18 ENCOUNTER — Other Ambulatory Visit (HOSPITAL_COMMUNITY): Payer: Self-pay

## 2022-10-18 DIAGNOSIS — T83091A Other mechanical complication of indwelling urethral catheter, initial encounter: Secondary | ICD-10-CM | POA: Insufficient documentation

## 2022-10-18 DIAGNOSIS — Z7982 Long term (current) use of aspirin: Secondary | ICD-10-CM | POA: Insufficient documentation

## 2022-10-18 DIAGNOSIS — Z466 Encounter for fitting and adjustment of urinary device: Secondary | ICD-10-CM | POA: Insufficient documentation

## 2022-10-18 DIAGNOSIS — Y732 Prosthetic and other implants, materials and accessory gastroenterology and urology devices associated with adverse incidents: Secondary | ICD-10-CM | POA: Insufficient documentation

## 2022-10-18 DIAGNOSIS — Z7689 Persons encountering health services in other specified circumstances: Secondary | ICD-10-CM

## 2022-10-18 DIAGNOSIS — Z79899 Other long term (current) drug therapy: Secondary | ICD-10-CM | POA: Insufficient documentation

## 2022-10-18 MED ORDER — PHENAZOPYRIDINE HCL 200 MG PO TABS
200.0000 mg | ORAL_TABLET | Freq: Three times a day (TID) | ORAL | 0 refills | Status: DC
  Filled 2022-10-18: qty 6, 2d supply, fill #0

## 2022-10-18 MED ORDER — HYDROCODONE-ACETAMINOPHEN 5-325 MG PO TABS
2.0000 | ORAL_TABLET | Freq: Four times a day (QID) | ORAL | 0 refills | Status: DC | PRN
  Filled 2022-10-18: qty 8, 1d supply, fill #0

## 2022-10-18 NOTE — ED Provider Notes (Signed)
[place] EMERGENCY DEPARTMENT AT [facility] Provider Note   CSN: [id number] Arrival date & time: 10/18/22  0136     History  Chief Complaint  Patient presents with   urinary catheter problem    Edgar Mckay Sr. is a 87 y.o. male with past medical history significant for urinary retention presents to the ED stating the Foley catheter that was just placed overnight "does not feel right".  Patient had his Foley catheter exchanged at [facility] yesterday as well.  He checked back in shortly after discharge with concerns that the catheter is painful and his abdomen feels "full" again.  He has emptied the leg bag once already and states urine continues to fill bag.         Home Medications Prior to Admission medications   Medication Sig Start Date End Date Taking? Authorizing Provider  HYDROcodone-acetaminophen (NORCO/VICODIN) 5-325 MG tablet Take 2 tablets by mouth every 6 (six) hours as needed. 10/18/22  Yes Mashal Slavick R, PA-C  phenazopyridine (PYRIDIUM) 200 MG tablet Take 1 tablet (200 mg total) by mouth 3 (three) times daily. 10/18/22  Yes Kimiko Common R, PA-C  acetaminophen (TYLENOL) 325 MG tablet Take 2 tablets (650 mg total) by mouth every 6 (six) hours as needed. 12/05/20   Sabino Dick, DO  aspirin 81 MG chewable tablet Chew 1 tablet (81 mg total) by mouth daily. 03/10/21   Bonnita Nasuti, AGNP-C  atorvastatin (LIPITOR) 40 MG tablet Take 1 tablet (40 mg total) by mouth at bedtime. 12/08/20   Valetta Close, MD  ciprofloxacin (CIPRO) 500 MG tablet Take 1 tablet (500 mg total) by mouth 2 (two) times daily. 08/25/22   Jacalyn Lefevre, MD  cyanocobalamin 1000 MCG tablet Take 1 tablet (1,000 mcg total) by mouth daily for Vitamin Supplement. 03/10/21   Bonnita Nasuti, AGNP-C  docusate sodium (COLACE) 100 MG capsule Take 1 capsule (100 mg total) by mouth daily for constipation. 03/10/21   Bonnita Nasuti, AGNP-C  finasteride (PROSCAR) 5 MG tablet Take 1 tablet (5  mg total) by mouth daily. 12/05/20   Sabino Dick, DO  folic acid (FOLVITE) 1 MG tablet Take 1 tablet (1 mg total) by mouth daily for supplement. 03/10/21   Bonnita Nasuti, AGNP-C  gabapentin (NEURONTIN) 100 MG capsule Take 1 capsule (100 mg total) by mouth 3 (three) times daily for neuropathy. 03/10/21   Bonnita Nasuti, AGNP-C  Multiple Vitamin (MULTIVITAMIN WITH MINERALS) TABS tablet Take 1 tablet by mouth daily. 12/05/20   Sabino Dick, DO  nicotine (NICODERM CQ - DOSED IN MG/24 HOURS) 21 mg/24hr patch Place 1 patch (21 mg total) onto the skin daily. 12/05/20   Sabino Dick, DO  nitrofurantoin, macrocrystal-monohydrate, (MACROBID) 100 MG capsule Take 1 capsule (100 mg total) by mouth 2 (two) times daily. 04/09/22   Ernie Avena, MD  polyethylene glycol (MIRALAX / GLYCOLAX) 17 g packet Take 17 g by mouth daily as needed for mild constipation. 12/05/20   Sabino Dick, DO  pravastatin (PRAVACHOL) 20 MG tablet Take 1 tablet (20 mg total) by mouth daily. 11/28/21     pravastatin (PRAVACHOL) 20 MG tablet Take 1 tablet (20 mg total) by mouth daily- NEEDS VISIT 03/06/22     senna (SENOKOT) 8.6 MG TABS tablet Take 2 tablets (17.2 mg total) by mouth daily. 12/07/20   Princess Bruins, DO  sertraline (ZOLOFT) 25 MG tablet Take 1 tablet (25 mg total) by mouth in the morning for depression. 03/10/21   Bonnita Nasuti, AGNP-C  tamsulosin (  FLOMAX) 0.4 MG CAPS capsule Take 1 capsule (0.4 mg total) by mouth daily. 03/10/21   Bonnita Nasuti, AGNP-C  thiamine 100 MG tablet Take 1 tablet (100 mg total) by mouth daily. 12/05/20   Sabino Dick, DO      Allergies    Patient has no known allergies.    Review of Systems   Review of Systems  Gastrointestinal:  Positive for abdominal distention. Negative for abdominal pain.  Genitourinary:  Positive for penile pain. Negative for decreased urine volume, difficulty urinating, dysuria and penile discharge.    Physical Exam Updated Vital  Signs BP (!) 149/96 (BP Location: Right Arm)   Pulse 65   Temp 98.4 F (36.9 C) (Oral)   Resp 16   SpO2 100%  Physical Exam Vitals and nursing note reviewed.  Constitutional:      General: He is not in acute distress.    Appearance: Normal appearance. He is not ill-appearing or diaphoretic.  Cardiovascular:     Rate and Rhythm: Normal rate and regular rhythm.  Pulmonary:     Effort: Pulmonary effort is normal.  Abdominal:     General: Abdomen is flat.     Palpations: Abdomen is soft.     Tenderness: There is no abdominal tenderness.  Genitourinary:    Penis: Uncircumcised. No erythema, discharge or swelling.      Comments: Foley catheter in place.  Minimal dried blood present on catheter.  Urine present in leg bag, appears blood tinged no clots.   Skin:    General: Skin is warm and dry.     Capillary Refill: Capillary refill takes less than 2 seconds.  Neurological:     Mental Status: He is alert. Mental status is at baseline.  Psychiatric:        Mood and Affect: Mood normal.        Behavior: Behavior normal.     ED Results / Procedures / Treatments   Labs (all labs ordered are listed, but only abnormal results are displayed) Labs Reviewed - No data to display  EKG None  Radiology No results found.  Procedures Procedures    Medications Ordered in ED Medications - No data to display  ED Course/ Medical Decision Making/ A&P                                 Medical Decision Making  This patient presents to the ED with chief complaint(s) of Foley cathter probelm with pertinent past medical history of urinary retention.  The complaint involves an extensive differential diagnosis and also carries with it a high risk of complications and morbidity.    Initial Assessment:   Exam significant for overall well appearing patient who is not in acute distress.  Foley catheter in place.  Minimal amount of dried blood present on catheter tubing.  No swelling, discharge,  or erythema to the penis.  Urine present in leg bag, appears blood tinged, but without clots.  Abdomen is soft, non distended and non tender to palpation.    Treatment and Reassessment: Bladder scan was performed and demonstrated 35 mL in the bladder.    Disposition:   Patient's Foley cathter appears to be functioning properly.  I do not feel that exchange or flushing is required at this time.  Will discharge patient with oral pain medicine for discomfort and pyridium for urethral spasms.  Patient to follow up with urology.    The  patient has been appropriately medically screened and/or stabilized in the ED. I have low suspicion for any other emergent medical condition which would require further screening, evaluation or treatment in the ED or require inpatient management. At time of discharge the patient is hemodynamically stable and in no acute distress. I have discussed work-up results and diagnosis with patient and answered all questions. Patient is agreeable with discharge plan. We discussed strict return precautions for returning to the emergency department and they verbalized understanding.            Final Clinical Impression(s) / ED Diagnoses Final diagnoses:  Encounter for assessment of Foley catheter    Rx / DC Orders ED Discharge Orders          Ordered    HYDROcodone-acetaminophen (NORCO/VICODIN) 5-325 MG tablet  Every 6 hours PRN        10/18/22 1013    phenazopyridine (PYRIDIUM) 200 MG tablet  3 times daily        10/18/22 [address], PA-C 10/18/22 1014    [place], [place], MD 10/18/22 2128

## 2022-10-18 NOTE — ED Triage Notes (Signed)
Pt wanted to be rechecked immediately after being discharged. Pt states that his catheter just does not feel right

## 2022-10-18 NOTE — Discharge Instructions (Signed)
Thank you for allowing [place] to be a part of your care.  Continue to monitor your urine output.  If you no longer see urine in your bag, you need to have your Foley reassessed.    Schedule a follow up appointment with [facility].   Medicine has been sent to the pharmacy to help with your discomfort.  Take as prescribed.

## 2022-10-18 NOTE — Discharge Instructions (Signed)
Please follow-up with urology. Return here for new concerns.

## 2022-10-27 ENCOUNTER — Ambulatory Visit (INDEPENDENT_AMBULATORY_CARE_PROVIDER_SITE_OTHER): Payer: 59 | Admitting: Podiatry

## 2022-10-27 DIAGNOSIS — M79674 Pain in right toe(s): Secondary | ICD-10-CM | POA: Diagnosis not present

## 2022-10-27 DIAGNOSIS — M79675 Pain in left toe(s): Secondary | ICD-10-CM

## 2022-10-27 DIAGNOSIS — B351 Tinea unguium: Secondary | ICD-10-CM

## 2022-10-27 NOTE — Progress Notes (Signed)
  Subjective:  Patient ID: Edgar BREAKER Sr., male    DOB: [birth year],  MRN: [id number]  Chief Complaint  Patient presents with   Nail Problem    RFC.    87 y.o. male presents with the above complaint. History confirmed with patient.  Nails become thickened and elongated again, regular intermittent debridements have been helpful for this  Objective:  Physical Exam: warm, good capillary refill, no trophic changes or ulcerative lesions, normal DP and PT pulses, and normal sensory exam. Left Foot: dystrophic yellowed discolored nail plates with subungual debris Right Foot: dystrophic yellowed discolored nail plates with subungual debris  Assessment:   1. Pain due to onychomycosis of toenails of both feet      Plan:  Patient was evaluated and treated and all questions answered.  Discussed the etiology and treatment options for the condition in detail with the patient.  Prior debridements helpful. Recommended debridement of the nails today. Sharp and mechanical debridement performed of all painful and mycotic nails today. Nails debrided in length and thickness using a nail nipper to level of comfort. Discussed treatment options including appropriate shoe gear. Follow up as needed for painful nails.    Return in about 3 months (around 01/27/2023) for painful thick fungal nails.

## 2022-11-20 ENCOUNTER — Emergency Department (HOSPITAL_COMMUNITY)
Admission: EM | Admit: 2022-11-20 | Discharge: 2022-11-20 | Disposition: A | Discharge status: Home / Self Care | Payer: 59 | Attending: Emergency Medicine | Admitting: Emergency Medicine

## 2022-11-20 ENCOUNTER — Encounter (HOSPITAL_COMMUNITY): Payer: Self-pay

## 2022-11-20 ENCOUNTER — Other Ambulatory Visit: Payer: Self-pay

## 2022-11-20 DIAGNOSIS — T839XXA Unspecified complication of genitourinary prosthetic device, implant and graft, initial encounter: Secondary | ICD-10-CM

## 2022-11-20 DIAGNOSIS — T83031A Leakage of indwelling urethral catheter, initial encounter: Secondary | ICD-10-CM | POA: Diagnosis present

## 2022-11-20 DIAGNOSIS — Z7982 Long term (current) use of aspirin: Secondary | ICD-10-CM | POA: Diagnosis not present

## 2022-11-20 NOTE — ED Triage Notes (Addendum)
Reports he has catheter due to prostate issues but it started leaking around the catheter last night.  Denies any other problems,.  Reports it was placed in MD office approx 1 week ago.

## 2022-11-20 NOTE — ED Notes (Signed)
Son Edgar Mckay aware pt is ready to be picked up

## 2022-11-20 NOTE — ED Notes (Addendum)
Pt states he woke up this morning and his brief was wet. He still has urine in the catheter bag. 0mL seen on bladder scan. No urine in brief at this time.

## 2022-11-20 NOTE — ED Provider Notes (Signed)
[name] Provider Note   CSN: [id number] Arrival date & time: 11/20/22  7829     History  Chief Complaint  Patient presents with   Urinary Retention    Edgar Smoke Eblin Sr. is a 87 y.o. 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  Patient states he noticed it began leaking around the catheter last night and he woke with a wet brief this morning.  He reports it was changed out in doctor's office approximately 1 week ago.  Patient was seen last month for catheter problems in ED also.  Denies hematuria, penile swelling or pain.        Home Medications Prior to Admission medications   Medication Sig Start Date End Date Taking? Authorizing Provider  acetaminophen (TYLENOL) 325 MG tablet Take 2 tablets (650 mg total) by mouth every 6 (six) hours as needed. 12/05/20   Sabino Dick, DO  aspirin 81 MG chewable tablet Chew 1 tablet (81 mg total) by mouth daily. 03/10/21   Bonnita Nasuti, AGNP-C  atorvastatin (LIPITOR) 40 MG tablet Take 1 tablet (40 mg total) by mouth at bedtime. 12/08/20   Valetta Close, MD  ciprofloxacin (CIPRO) 500 MG tablet Take 1 tablet (500 mg total) by mouth 2 (two) times daily. 08/25/22   Jacalyn Lefevre, MD  cyanocobalamin 1000 MCG tablet Take 1 tablet (1,000 mcg total) by mouth daily for Vitamin Supplement. 03/10/21   Bonnita Nasuti, AGNP-C  docusate sodium (COLACE) 100 MG capsule Take 1 capsule (100 mg total) by mouth daily for constipation. 03/10/21   Bonnita Nasuti, AGNP-C  finasteride (PROSCAR) 5 MG tablet Take 1 tablet (5 mg total) by mouth daily. 12/05/20   Sabino Dick, DO  folic acid (FOLVITE) 1 MG tablet Take 1 tablet (1 mg total) by mouth daily for supplement. 03/10/21   Bonnita Nasuti, AGNP-C  gabapentin (NEURONTIN) 100 MG capsule Take 1 capsule  (100 mg total) by mouth 3 (three) times daily for neuropathy. 03/10/21   Bonnita Nasuti, AGNP-C  HYDROcodone-acetaminophen (NORCO/VICODIN) 5-325 MG tablet Take 2 tablets by mouth every 6 (six) hours as needed. 10/18/22   Taheem Fricke R, PA-C  Multiple Vitamin (MULTIVITAMIN WITH MINERALS) TABS tablet Take 1 tablet by mouth daily. 12/05/20   Sabino Dick, DO  nicotine (NICODERM CQ - DOSED IN MG/24 HOURS) 21 mg/24hr patch Place 1 patch (21 mg total) onto the skin daily. 12/05/20   Sabino Dick, DO  nitrofurantoin, macrocrystal-monohydrate, (MACROBID) 100 MG capsule Take 1 capsule (100 mg total) by mouth 2 (two) times daily. 04/09/22   Ernie Avena, MD  phenazopyridine (PYRIDIUM) 200 MG tablet Take 1 tablet (200 mg total) by mouth 3 (three) times daily. 10/18/22   Layani Foronda R, PA-C  polyethylene glycol (MIRALAX / GLYCOLAX) 17 g packet Take 17 g by mouth daily as needed for mild constipation. 12/05/20   Sabino Dick, DO  pravastatin (PRAVACHOL) 20 MG tablet Take 1 tablet (20 mg total) by mouth daily. 11/28/21     pravastatin (PRAVACHOL) 20 MG tablet Take 1 tablet (20 mg total) by mouth daily- NEEDS VISIT 03/06/22     senna (SENOKOT) 8.6 MG TABS tablet Take 2 tablets (17.2 mg total) by mouth daily. 12/07/20   Princess Bruins, DO  sertraline (ZOLOFT) 25 MG tablet Take 1 tablet (25 mg total) by mouth in the morning for depression. 03/10/21  Bonnita Nasuti, AGNP-C  tamsulosin (FLOMAX) 0.4 MG CAPS capsule Take 1 capsule (0.4 mg total) by mouth daily. 03/10/21   Bonnita Nasuti, AGNP-C  thiamine 100 MG tablet Take 1 tablet (100 mg total) by mouth daily. 12/05/20   Sabino Dick, DO      Allergies    Patient has no known allergies.    Review of Systems   Review of Systems  Genitourinary:  Negative for hematuria, penile pain and penile swelling.    Physical Exam Updated Vital Signs BP (!) 110/52 (BP Location: Right Arm)   Pulse 67   Temp 98.1 F (36.7 C)   Resp 20   Ht 5'  9" (1.753 m)   Wt 98.9 kg   SpO2 95%   BMI 32.19 kg/m  Physical Exam Vitals and nursing note reviewed.  Constitutional:      General: He is not in acute distress.    Appearance: Normal appearance. He is not ill-appearing or diaphoretic.  Cardiovascular:     Rate and Rhythm: Normal rate and regular rhythm.  Pulmonary:     Effort: Pulmonary effort is normal.  Genitourinary:    Penis: Normal. No erythema or swelling.   Skin:    General: Skin is warm and dry.     Capillary Refill: Capillary refill takes less than 2 seconds.  Neurological:     Mental Status: He is alert. Mental status is at baseline.  Psychiatric:        Mood and Affect: Mood normal.        Behavior: Behavior normal.     ED Results / Procedures / Treatments   Labs (all labs ordered are listed, but only abnormal results are displayed) Labs Reviewed - No data to display  EKG None  Radiology No results found.  Procedures Procedures    Medications Ordered in ED Medications - No data to display  ED Course/ Medical Decision Making/ A&P                                 Medical Decision Making  This patient presents to the ED with chief complaint(s) of Foley catheter problems with pertinent past medical history of chronic catheter use, bladder outlet obstruction, urinary retention.  The complaint involves an extensive differential diagnosis and also carries with it a high risk of complications and morbidity.    Initial Assessment:   Exam significant for overall well-appearing patient who is not in acute distress.  There is urine in the catheter bag and his brief is dry.  No obvious hematuria.  No penile swelling or discomfort.   Treatment and Reassessment: Foley catheter replaced without complication.  Urine draining into bag appropriately.   Disposition:   Patient's Foley catheter was replaced successfully and is not leaking.  Advised patient to follow up with urology as needed.  Return precautions  given.  Patient discharged in good condition.           Final Clinical Impression(s) / ED Diagnoses Final diagnoses:  Problem with Foley catheter, initial encounter [facility])    Rx / DC Orders ED Discharge Orders     None         Lenard Simmer, PA-C 11/20/22 4098    Maia Plan, MD 11/20/22 1010

## 2022-11-20 NOTE — Discharge Instructions (Signed)
Thank you for allowing [place] to be a part of your care today.  Your catheter was replaced successfully.  Follow up with urology as needed.  Return to the ED if you develop problems with your catheter.

## 2023-01-31 ENCOUNTER — Ambulatory Visit: Payer: 59 | Admitting: Podiatry

## 2023-02-07 ENCOUNTER — Ambulatory Visit (INDEPENDENT_AMBULATORY_CARE_PROVIDER_SITE_OTHER): Payer: 59 | Admitting: Podiatry

## 2023-02-07 DIAGNOSIS — Z91199 Patient's noncompliance with other medical treatment and regimen due to unspecified reason: Secondary | ICD-10-CM

## 2023-02-07 NOTE — Progress Notes (Signed)
Same day cancellation

## 2023-02-15 ENCOUNTER — Encounter: Payer: Self-pay | Admitting: Podiatry

## 2023-02-15 ENCOUNTER — Ambulatory Visit (INDEPENDENT_AMBULATORY_CARE_PROVIDER_SITE_OTHER): Payer: 59 | Admitting: Podiatry

## 2023-02-15 DIAGNOSIS — M79674 Pain in right toe(s): Secondary | ICD-10-CM

## 2023-02-15 DIAGNOSIS — B351 Tinea unguium: Secondary | ICD-10-CM | POA: Diagnosis not present

## 2023-02-15 DIAGNOSIS — M79675 Pain in left toe(s): Secondary | ICD-10-CM | POA: Diagnosis not present

## 2023-02-15 NOTE — Progress Notes (Signed)
This patient presents to the office with chief complaint of long thick painful nails.  Patient says the nails are painful walking and wearing shoes.  This patient is unable to self treat.  This patient is unable to trim his nails since he is unable to reach his nails.  he presents to the office for preventative foot care services.  General Appearance  Alert, conversant and in no acute stress.  Vascular  Dorsalis pedis and posterior tibial  pulses are  weakly palpable  bilaterally.  Capillary return is within normal limits  bilaterally. Temperature is within normal limits  bilaterally.  Neurologic  Senn-Weinstein monofilament wire test within normal limits  bilaterally. Muscle power within normal limits bilaterally.  Nails Thick disfigured discolored nails with subungual debris  from hallux to fifth toes bilaterally. No evidence of bacterial infection or drainage bilaterally.  Orthopedic  No limitations of motion  feet .  No crepitus or effusions noted.  No bony pathology or digital deformities noted.  Skin  normotropic skin with no porokeratosis noted bilaterally.  No signs of infections or ulcers noted.     Onychomycosis  Nails  B/L.  Pain in right toes  Pain in left toes  Debridement of nails both feet followed trimming the nails with dremel tool.    RTC 3 months.   Helane Gunther DPM

## 2023-04-02 IMAGING — MR MR HEAD W/O CM
6 of 10 series · 29 of 48 positions shown · non-contrast
Comparison: Head CT 12/01/2020.

CLINICAL DATA: Mental status change, unknown cause. Additional
history provided: Patient admitted for urinary retention and UTI
secondary to chronic and improperly positioned Foley. Mental status
change.

EXAM:
MRI HEAD WITHOUT CONTRAST
TECHNIQUE: Multiplanar, multiecho pulse sequences of the brain and surrounding
structures were obtained without intravenous contrast.

[Series 2: DWI · axial · 3.0mm · 0.94mm/px · z∈[-26,+119]mm · 9 of 100 slices shown (1 of 2)]
[im 1/100]
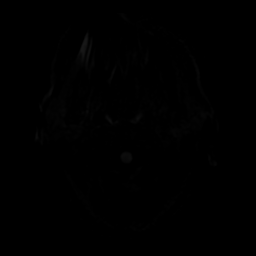
[im 13/100]
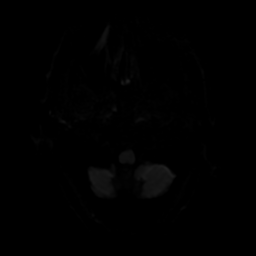
[im 25/100]
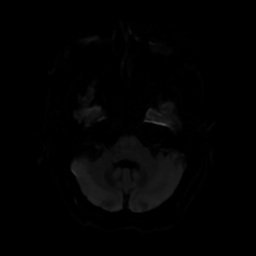
[im 38/100]
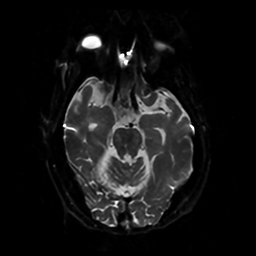
[im 50/100]
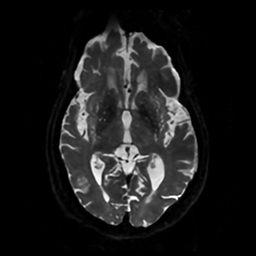
[im 62/100]
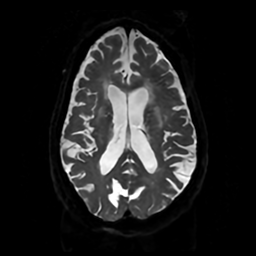
[im 75/100]
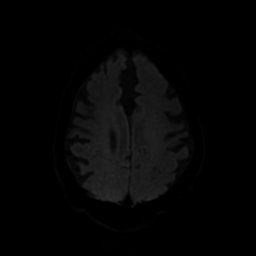
[im 87/100]
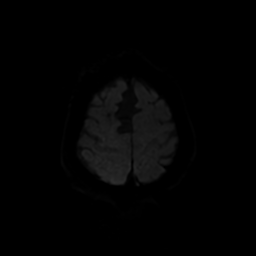
[im 100/100]
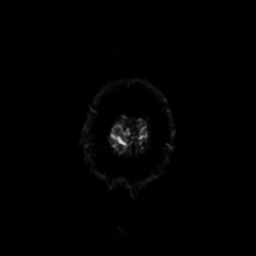

[Series 3: DWI · coronal · 4.0mm · 0.94mm/px · 7 of 74 slices shown (2 of 2)]
[im 1/74]
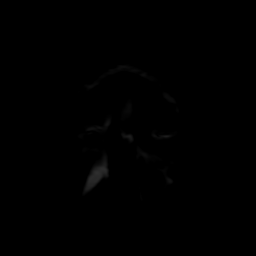
[im 13/74]
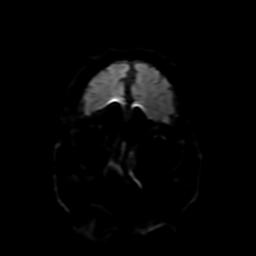
[im 25/74]
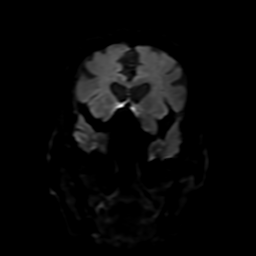
[im 37/74]
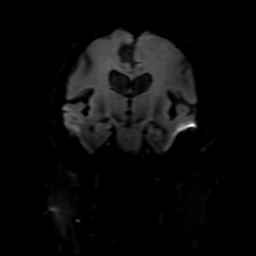
[im 49/74]
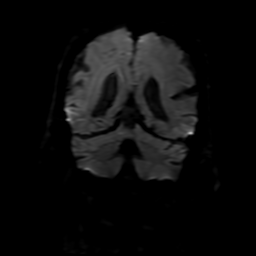
[im 61/74]
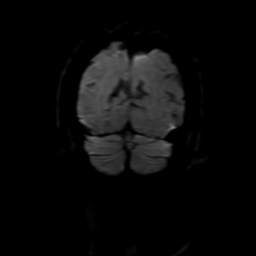
[im 74/74]
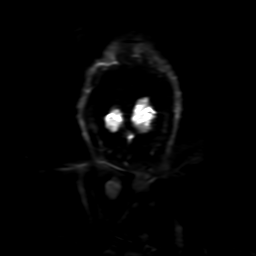

[Series 4: FLAIR · sagittal · 5.0mm · 0.23mm/px · 2 of 23 slices shown (1 of 2)]
[im 1/23]
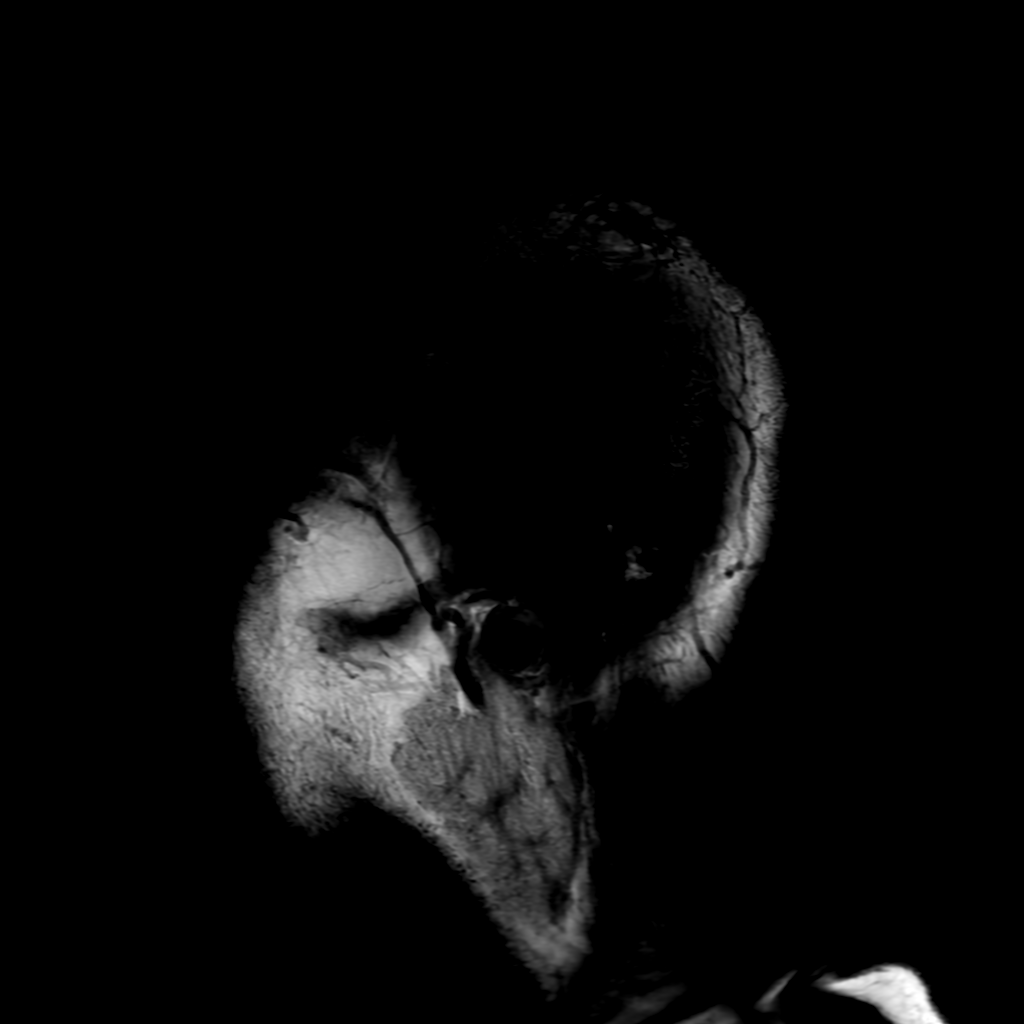
[im 23/23]
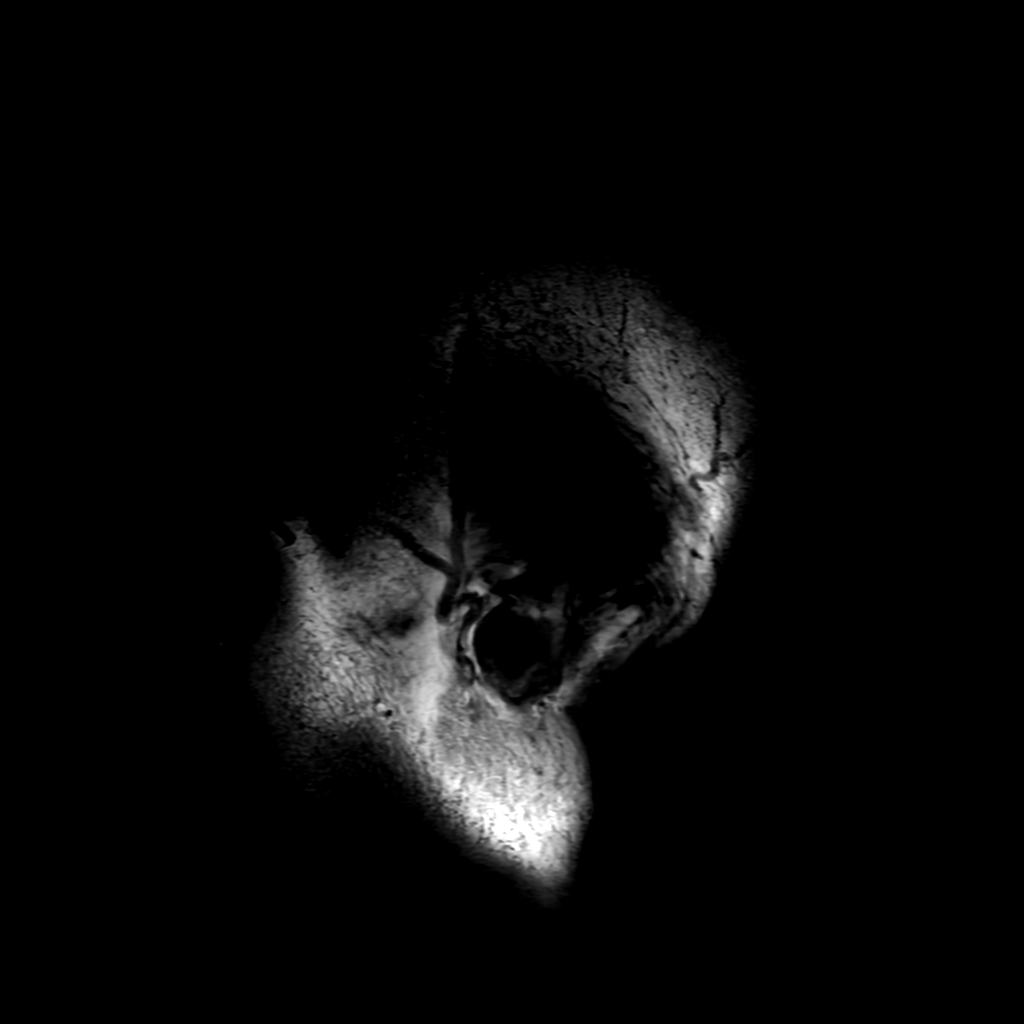

[Series 6: FLAIR · axial · 4.0mm · 0.45mm/px · z∈[-23,+116]mm · 3 of 33 slices shown (2 of 2)]
[im 1/33]
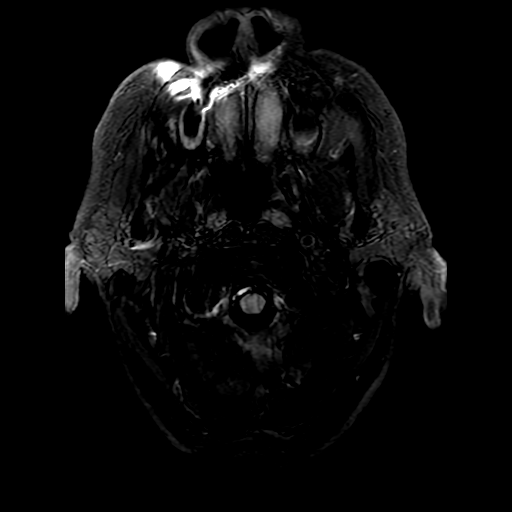
[im 17/33]
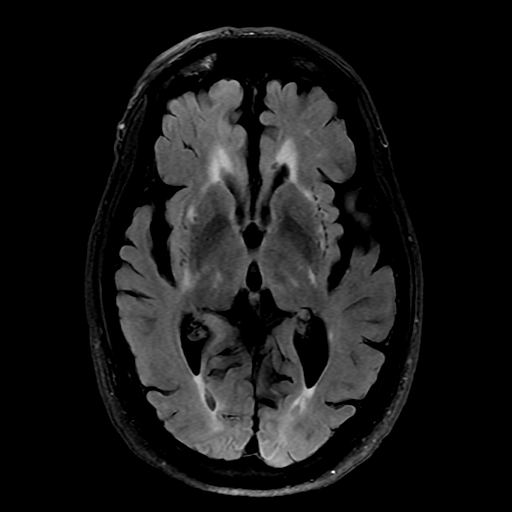
[im 33/33]
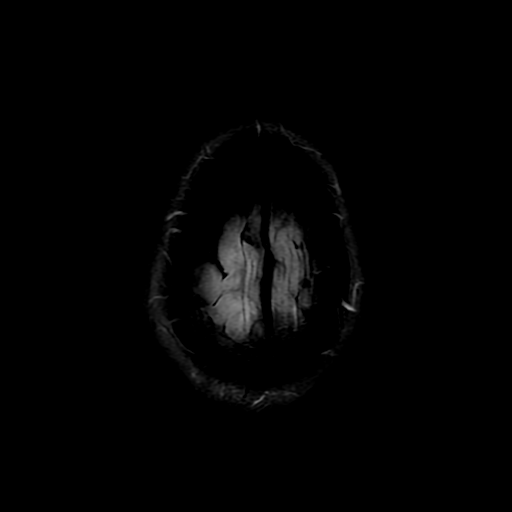

[Series 250: ADC · axial · 3.0mm · 0.94mm/px · z∈[-26,+119]mm · 5 of 50 slices shown (1 of 2)]
[im 1/50]
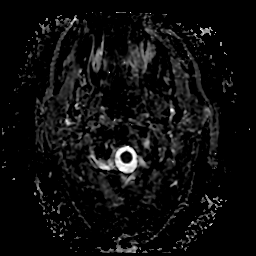
[im 13/50]
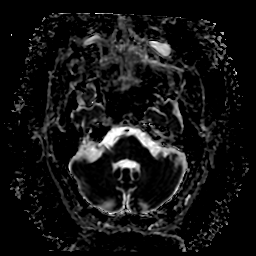
[im 25/50]
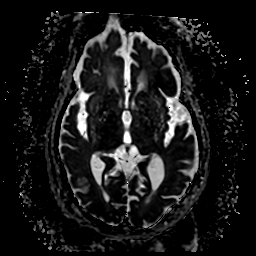
[im 37/50]
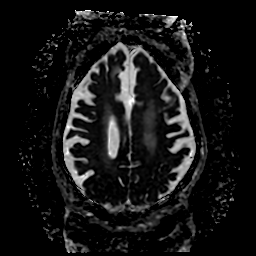
[im 50/50]
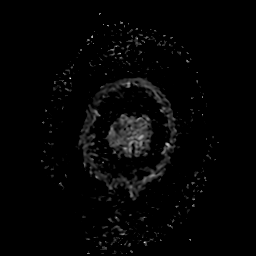

[Series 350: ADC · coronal · 4.0mm · 0.94mm/px · 3 of 37 slices shown (2 of 2)]
[im 1/37]
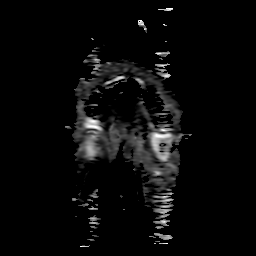
[im 19/37]
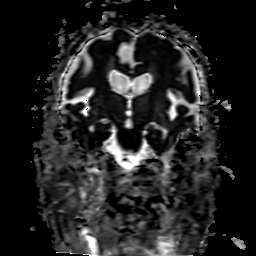
[im 37/37]
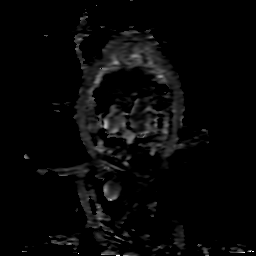

[29 of 48 positions shown; findings below may reference images not displayed]

FINDINGS: Brain:

Moderate cerebral atrophy. Comparatively mild cerebellar atrophy.

3 mm focus of diffusion weighted signal abnormality within the right
frontoparietal subcortical white matter appreciated on the axial
diffusion-weighted sequence only (for instance as seen on series 2,
image 40). This is not appreciated on the coronal diffusion-weighted
sequence and may reflect a small acute/subacute infarct or artifact.

Small chronic cortical/subcortical infarct within the anteromedial
right frontal lobe (right ACA vascular territory).

Chronic small-vessel infarcts within the right centrum semiovale and
right pons. Additionally, there are circumscribed T2 hyperintense
foci within the bilateral basal ganglia, likely reflecting a
combination of prominent perivascular spaces and chronic lacunar
infarcts.

Background moderate/advanced chronic small vessel ischemic changes
within the cerebral white matter and pons. Chronic small vessel
ischemic changes also present within the bilateral thalami.

Chronic microhemorrhages within the medial left temporal lobe and
within the left pons.

No evidence of an intracranial mass.

No extra-axial fluid collection.

No midline shift.

Vascular: Maintained flow voids within the proximal large arterial
vessels.

Skull and upper cervical spine: No focal suspicious marrow lesion.

Sinuses/Orbits: Visualized orbits show no acute finding. Mild
mucosal thickening within the bilateral ethmoid and maxillary
sinuses.
IMPRESSION: Possible 3 mm acute/early subacute infarct within the right
frontoparietal subcortical white matter, versus image noise
artifact.

Small chronic cortical/subcortical infarct within the anteromedial
right frontal lobe (right ACA vascular territory).

Chronic small-vessel infarcts within the right centrum semiovale,
within the basal ganglia and within the right pons.

Background moderate/advanced chronic small vessel ischemic changes
within the cerebral white matter and pons. Chronic small vessel
ischemic changes also present within the thalami.

Moderate cerebral atrophy.  Comparatively mild cerebellar atrophy.

Mild paranasal sinus mucosal thickening.

## 2023-05-17 ENCOUNTER — Encounter: Payer: Self-pay | Admitting: Podiatry

## 2023-05-17 ENCOUNTER — Ambulatory Visit (INDEPENDENT_AMBULATORY_CARE_PROVIDER_SITE_OTHER): Payer: 59 | Admitting: Podiatry

## 2023-05-17 DIAGNOSIS — M79674 Pain in right toe(s): Secondary | ICD-10-CM | POA: Diagnosis not present

## 2023-05-17 DIAGNOSIS — B351 Tinea unguium: Secondary | ICD-10-CM | POA: Diagnosis not present

## 2023-05-17 DIAGNOSIS — M79675 Pain in left toe(s): Secondary | ICD-10-CM | POA: Diagnosis not present

## 2023-05-17 NOTE — Progress Notes (Signed)
This patient presents to the office with chief complaint of long thick painful nails.  Patient says the nails are painful walking and wearing shoes.  This patient is unable to self treat.  This patient is unable to trim his nails since he is unable to reach his nails.  he presents to the office for preventative foot care services.  General Appearance  Alert, conversant and in no acute stress.  Vascular  Dorsalis pedis and posterior tibial  pulses are  weakly palpable  bilaterally.  Capillary return is within normal limits  bilaterally. Temperature is within normal limits  bilaterally.  Neurologic  Senn-Weinstein monofilament wire test within normal limits  bilaterally. Muscle power within normal limits bilaterally.  Nails Thick disfigured discolored nails with subungual debris  from hallux to fifth toes bilaterally. No evidence of bacterial infection or drainage bilaterally.  Orthopedic  No limitations of motion  feet .  No crepitus or effusions noted.  No bony pathology or digital deformities noted.  Skin  normotropic skin with no porokeratosis noted bilaterally.  No signs of infections or ulcers noted.     Onychomycosis  Nails  B/L.  Pain in right toes  Pain in left toes  Debridement of nails both feet followed trimming the nails with dremel tool.    RTC 3 months.   Helane Gunther DPM

## 2023-07-21 ENCOUNTER — Inpatient Hospital Stay (HOSPITAL_COMMUNITY)

## 2023-07-21 ENCOUNTER — Inpatient Hospital Stay (HOSPITAL_COMMUNITY)
Admission: EM | Admit: 2023-07-21 | Discharge: 2023-07-27 | DRG: 699 | Disposition: A | Discharge status: Skilled Nursing Facility | Attending: Internal Medicine | Admitting: Internal Medicine

## 2023-07-21 ENCOUNTER — Encounter (HOSPITAL_COMMUNITY): Payer: Self-pay

## 2023-07-21 DIAGNOSIS — Y92009 Unspecified place in unspecified non-institutional (private) residence as the place of occurrence of the external cause: Secondary | ICD-10-CM | POA: Diagnosis not present

## 2023-07-21 DIAGNOSIS — Z8673 Personal history of transient ischemic attack (TIA), and cerebral infarction without residual deficits: Secondary | ICD-10-CM | POA: Diagnosis not present

## 2023-07-21 DIAGNOSIS — I440 Atrioventricular block, first degree: Secondary | ICD-10-CM | POA: Diagnosis present

## 2023-07-21 DIAGNOSIS — R338 Other retention of urine: Secondary | ICD-10-CM | POA: Diagnosis present

## 2023-07-21 DIAGNOSIS — F419 Anxiety disorder, unspecified: Secondary | ICD-10-CM | POA: Diagnosis present

## 2023-07-21 DIAGNOSIS — R5381 Other malaise: Secondary | ICD-10-CM | POA: Diagnosis present

## 2023-07-21 DIAGNOSIS — Z9181 History of falling: Secondary | ICD-10-CM | POA: Diagnosis not present

## 2023-07-21 DIAGNOSIS — N390 Urinary tract infection, site not specified: Secondary | ICD-10-CM | POA: Diagnosis present

## 2023-07-21 DIAGNOSIS — Z87891 Personal history of nicotine dependence: Secondary | ICD-10-CM | POA: Diagnosis not present

## 2023-07-21 DIAGNOSIS — F32A Depression, unspecified: Secondary | ICD-10-CM | POA: Diagnosis present

## 2023-07-21 DIAGNOSIS — K5900 Constipation, unspecified: Secondary | ICD-10-CM | POA: Diagnosis present

## 2023-07-21 DIAGNOSIS — K5730 Diverticulosis of large intestine without perforation or abscess without bleeding: Secondary | ICD-10-CM | POA: Diagnosis present

## 2023-07-21 DIAGNOSIS — Y846 Urinary catheterization as the cause of abnormal reaction of the patient, or of later complication, without mention of misadventure at the time of the procedure: Secondary | ICD-10-CM | POA: Diagnosis present

## 2023-07-21 DIAGNOSIS — N401 Enlarged prostate with lower urinary tract symptoms: Secondary | ICD-10-CM | POA: Diagnosis present

## 2023-07-21 DIAGNOSIS — N3000 Acute cystitis without hematuria: Principal | ICD-10-CM | POA: Diagnosis present

## 2023-07-21 DIAGNOSIS — Z7982 Long term (current) use of aspirin: Secondary | ICD-10-CM | POA: Diagnosis not present

## 2023-07-21 DIAGNOSIS — R531 Weakness: Secondary | ICD-10-CM

## 2023-07-21 DIAGNOSIS — K449 Diaphragmatic hernia without obstruction or gangrene: Secondary | ICD-10-CM | POA: Diagnosis present

## 2023-07-21 DIAGNOSIS — T83511A Infection and inflammatory reaction due to indwelling urethral catheter, initial encounter: Secondary | ICD-10-CM | POA: Diagnosis present

## 2023-07-21 DIAGNOSIS — B964 Proteus (mirabilis) (morganii) as the cause of diseases classified elsewhere: Secondary | ICD-10-CM | POA: Diagnosis present

## 2023-07-21 DIAGNOSIS — R001 Bradycardia, unspecified: Secondary | ICD-10-CM | POA: Diagnosis present

## 2023-07-21 DIAGNOSIS — W19XXXA Unspecified fall, initial encounter: Secondary | ICD-10-CM

## 2023-07-21 DIAGNOSIS — Z79899 Other long term (current) drug therapy: Secondary | ICD-10-CM

## 2023-07-21 DIAGNOSIS — K4090 Unilateral inguinal hernia, without obstruction or gangrene, not specified as recurrent: Secondary | ICD-10-CM | POA: Diagnosis present

## 2023-07-21 DIAGNOSIS — Z6834 Body mass index (BMI) 34.0-34.9, adult: Secondary | ICD-10-CM

## 2023-07-21 DIAGNOSIS — Z1152 Encounter for screening for COVID-19: Secondary | ICD-10-CM | POA: Diagnosis not present

## 2023-07-21 DIAGNOSIS — E669 Obesity, unspecified: Secondary | ICD-10-CM | POA: Diagnosis present

## 2023-07-21 LAB — RESP PANEL BY RT-PCR (FLU A&B, COVID) ARPGX2
Influenza A by PCR: NEGATIVE
Influenza B by PCR: NEGATIVE
SARS Coronavirus 2 by RT PCR: NEGATIVE

## 2023-07-21 LAB — URINALYSIS, W/ REFLEX TO CULTURE (INFECTION SUSPECTED)
Glucose, UA: NEGATIVE mg/dL
Hgb urine dipstick: NEGATIVE
Ketones, ur: NEGATIVE mg/dL
Nitrite: NEGATIVE
Protein, ur: 100 mg/dL — AB
Specific Gravity, Urine: 1.025 (ref 1.005–1.030)
pH: 7 (ref 5.0–8.0)

## 2023-07-21 LAB — CBC
HCT: 41.3 % (ref 39.0–52.0)
Hemoglobin: 12.9 g/dL — ABNORMAL LOW (ref 13.0–17.0)
MCH: 27.2 pg (ref 26.0–34.0)
MCHC: 31.2 g/dL (ref 30.0–36.0)
MCV: 86.9 fL (ref 80.0–100.0)
Platelets: 168 K/uL (ref 150–400)
RBC: 4.75 MIL/uL (ref 4.22–5.81)
RDW: 14.3 % (ref 11.5–15.5)
WBC: 7.9 K/uL (ref 4.0–10.5)
nRBC: 0 % (ref 0.0–0.2)

## 2023-07-21 LAB — CBC WITH DIFFERENTIAL/PLATELET
Abs Immature Granulocytes: 0.04 K/uL (ref 0.00–0.07)
Basophils Absolute: 0.1 K/uL (ref 0.0–0.1)
Basophils Relative: 1 %
Eosinophils Absolute: 0.2 K/uL (ref 0.0–0.5)
Eosinophils Relative: 2 %
HCT: 41.2 % (ref 39.0–52.0)
Hemoglobin: 13.1 g/dL (ref 13.0–17.0)
Immature Granulocytes: 1 %
Lymphocytes Relative: 23 %
Lymphs Abs: 2 K/uL (ref 0.7–4.0)
MCH: 27.6 pg (ref 26.0–34.0)
MCHC: 31.8 g/dL (ref 30.0–36.0)
MCV: 86.9 fL (ref 80.0–100.0)
Monocytes Absolute: 1.2 K/uL — ABNORMAL HIGH (ref 0.1–1.0)
Monocytes Relative: 13 %
Neutro Abs: 5.2 K/uL (ref 1.7–7.7)
Neutrophils Relative %: 60 %
Platelets: 167 K/uL (ref 150–400)
RBC: 4.74 MIL/uL (ref 4.22–5.81)
RDW: 14.4 % (ref 11.5–15.5)
WBC: 8.7 K/uL (ref 4.0–10.5)
nRBC: 0 % (ref 0.0–0.2)

## 2023-07-21 LAB — COMPREHENSIVE METABOLIC PANEL WITH GFR
ALT: 25 U/L (ref 0–44)
AST: 25 U/L (ref 15–41)
Albumin: 3.5 g/dL (ref 3.5–5.0)
Alkaline Phosphatase: 41 U/L (ref 38–126)
Anion gap: 9 (ref 5–15)
BUN: 10 mg/dL (ref 8–23)
CO2: 24 mmol/L (ref 22–32)
Calcium: 9.4 mg/dL (ref 8.9–10.3)
Chloride: 108 mmol/L (ref 98–111)
Creatinine, Ser: 0.88 mg/dL (ref 0.61–1.24)
GFR, Estimated: >60 mL/min (ref >60)
Glucose, Bld: 78 mg/dL (ref 70–99)
Potassium: 4 mmol/L (ref 3.5–5.1)
Sodium: 141 mmol/L (ref 135–145)
Total Bilirubin: 0.5 mg/dL (ref 0.0–1.2)
Total Protein: 6.6 g/dL (ref 6.5–8.1)

## 2023-07-21 LAB — TSH: TSH: 2.529 u[IU]/mL (ref 0.350–4.500)

## 2023-07-21 LAB — CBG MONITORING, ED: Glucose-Capillary: 105 mg/dL — ABNORMAL HIGH (ref 70–99)

## 2023-07-21 LAB — TROPONIN I (HIGH SENSITIVITY)
Troponin I (High Sensitivity): 15 ng/L (ref <18)
Troponin I (High Sensitivity): 17 ng/L (ref <18)

## 2023-07-21 LAB — C-REACTIVE PROTEIN: CRP: 0.6 mg/dL (ref <1.0)

## 2023-07-21 LAB — CREATININE, SERUM
Creatinine, Ser: 0.83 mg/dL (ref 0.61–1.24)
GFR, Estimated: >60 mL/min (ref >60)

## 2023-07-21 LAB — T4, FREE: Free T4: 0.82 ng/dL (ref 0.61–1.12)

## 2023-07-21 LAB — CK: Total CK: 197 U/L (ref 49–397)

## 2023-07-21 MED ORDER — SODIUM CHLORIDE 0.9 % IV SOLN
2.0000 g | INTRAVENOUS | Status: AC
  Administered 2023-07-22 – 2023-07-25 (×4): 2 g via INTRAVENOUS
  Filled 2023-07-21 (×4): qty 20

## 2023-07-21 MED ORDER — FINASTERIDE 5 MG PO TABS
5.0000 mg | ORAL_TABLET | Freq: Every day | ORAL | Status: DC
  Administered 2023-07-22 – 2023-07-27 (×6): 5 mg via ORAL
  Filled 2023-07-21 (×6): qty 1

## 2023-07-21 MED ORDER — VITAMIN B-12 1000 MCG PO TABS
1000.0000 ug | ORAL_TABLET | Freq: Every day | ORAL | Status: DC
  Administered 2023-07-22 – 2023-07-27 (×6): 1000 ug via ORAL
  Filled 2023-07-21 (×6): qty 1

## 2023-07-21 MED ORDER — ACETAMINOPHEN 650 MG RE SUPP: 650.0000 mg | Freq: Four times a day (QID) | RECTAL | Status: DC | PRN

## 2023-07-21 MED ORDER — ACETAMINOPHEN 325 MG PO TABS: 650.0000 mg | ORAL_TABLET | Freq: Four times a day (QID) | ORAL | Status: DC | PRN

## 2023-07-21 MED ORDER — TAMSULOSIN HCL 0.4 MG PO CAPS
0.4000 mg | ORAL_CAPSULE | Freq: Every day | ORAL | Status: DC
  Administered 2023-07-22 – 2023-07-27 (×6): 0.4 mg via ORAL
  Filled 2023-07-21 (×6): qty 1

## 2023-07-21 MED ORDER — SODIUM CHLORIDE 0.9 % IV SOLN
1.0000 g | Freq: Once | INTRAVENOUS | Status: AC
  Administered 2023-07-21: 1 g via INTRAVENOUS
  Filled 2023-07-21: qty 10

## 2023-07-21 MED ORDER — HYDROCODONE-ACETAMINOPHEN 5-325 MG PO TABS
2.0000 | ORAL_TABLET | Freq: Four times a day (QID) | ORAL | Status: DC | PRN
  Administered 2023-07-25 (×2): 2 via ORAL
  Filled 2023-07-21 (×2): qty 2

## 2023-07-21 MED ORDER — PRAVASTATIN SODIUM 40 MG PO TABS
20.0000 mg | ORAL_TABLET | Freq: Every day | ORAL | Status: DC
  Administered 2023-07-22 – 2023-07-26 (×5): 20 mg via ORAL
  Filled 2023-07-21 (×5): qty 1

## 2023-07-21 MED ORDER — MORPHINE SULFATE (PF) 2 MG/ML IV SOLN: 2.0000 mg | INTRAVENOUS | Status: DC | PRN

## 2023-07-21 MED ORDER — ASPIRIN 81 MG PO CHEW
81.0000 mg | CHEWABLE_TABLET | Freq: Every day | ORAL | Status: DC
  Administered 2023-07-22 – 2023-07-27 (×6): 81 mg via ORAL
  Filled 2023-07-21 (×6): qty 1

## 2023-07-21 MED ORDER — INSULIN ASPART 100 UNIT/ML IJ SOLN: 0.0000 [IU] | Freq: Three times a day (TID) | INTRAMUSCULAR | Status: DC

## 2023-07-21 MED ORDER — THIAMINE MONONITRATE 100 MG PO TABS
100.0000 mg | ORAL_TABLET | Freq: Every day | ORAL | Status: DC
  Administered 2023-07-22 – 2023-07-27 (×6): 100 mg via ORAL
  Filled 2023-07-21 (×7): qty 1

## 2023-07-21 MED ORDER — HYDRALAZINE HCL 20 MG/ML IJ SOLN: 5.0000 mg | INTRAMUSCULAR | Status: DC | PRN

## 2023-07-21 MED ORDER — GABAPENTIN 100 MG PO CAPS
100.0000 mg | ORAL_CAPSULE | Freq: Three times a day (TID) | ORAL | Status: DC
  Administered 2023-07-21 – 2023-07-27 (×17): 100 mg via ORAL
  Filled 2023-07-21 (×17): qty 1

## 2023-07-21 MED ORDER — HEPARIN SODIUM (PORCINE) 5000 UNIT/ML IJ SOLN
5000.0000 [IU] | Freq: Three times a day (TID) | INTRAMUSCULAR | Status: DC
  Administered 2023-07-21 – 2023-07-27 (×17): 5000 [IU] via SUBCUTANEOUS
  Filled 2023-07-21 (×17): qty 1

## 2023-07-21 MED ORDER — IOHEXOL 350 MG/ML SOLN
100.0000 mL | Freq: Once | INTRAVENOUS | Status: AC | PRN
  Administered 2023-07-21: 100 mL via INTRAVENOUS

## 2023-07-21 MED ORDER — SODIUM CHLORIDE 0.9% FLUSH
3.0000 mL | Freq: Two times a day (BID) | INTRAVENOUS | Status: DC
  Administered 2023-07-21 – 2023-07-27 (×12): 3 mL via INTRAVENOUS

## 2023-07-21 MED ORDER — SODIUM CHLORIDE 0.9 % IV SOLN: INTRAVENOUS | Status: AC

## 2023-07-21 MED ORDER — SERTRALINE HCL 50 MG PO TABS
25.0000 mg | ORAL_TABLET | Freq: Every day | ORAL | Status: DC
  Administered 2023-07-22 – 2023-07-26 (×5): 25 mg via ORAL
  Filled 2023-07-21 (×5): qty 1

## 2023-07-21 NOTE — ED Notes (Signed)
 Patient transported to X-ray

## 2023-07-21 NOTE — ED Provider Notes (Signed)
 I discussed the case with the admitting physician who will come see the patient for admission, appreciate the hospitalist service for their excellent care of this patient   Cleotilde Rogue, MD 07/21/23 1659

## 2023-07-21 NOTE — ED Notes (Signed)
 Patient transported to CT

## 2023-07-21 NOTE — ED Provider Notes (Signed)
 [place] EMERGENCY DEPARTMENT AT [facility] Provider Note   CSN: [id number] Arrival date & time: 07/21/23  1105     Patient presents with: Weakness   Edgar C Lefever Sr. is a 88 y.o. male.   HPI     88 year old patient comes in with chief complaint of generalized weakness. Patient has history of chronic urine retention and chronic Foley catheter use.  His Foley catheter was changed last week.  Patient also has a history of TIA.  He states that over the last 2 days, he has noted increasing weakness.  Normally is able to get around with a cane, but it has been harder to do so.  Patient today almost fell, when he was trying to empty his Foley catheter.  EMS was called by his son, but patient declined coming to the ER.  Patient went back to his room and laid down.  But then he just was unable to get out of the bed -prompting him to call 911.  Patient denies any new medications.  Review of system is negative for any URI-like symptoms.  Patient denies any new abdominal pain, back pain, fevers, chills, nausea, vomiting.  He has no chest pain.  Prior to Admission medications   Medication Sig Start Date End Date Taking? Authorizing Provider  acetaminophen  (TYLENOL ) 325 MG tablet Take 2 tablets (650 mg total) by mouth every 6 (six) hours as needed. 12/05/20   Espinoza, Alejandra, DO  aspirin  81 MG chewable tablet Chew 1 tablet (81 mg total) by mouth daily. 03/10/21   Dietrich Males, AGNP-C  atorvastatin  (LIPITOR) 40 MG tablet Take 1 tablet (40 mg total) by mouth at bedtime. 12/08/20   Macario Dorothyann HERO, MD  ciprofloxacin  (CIPRO ) 500 MG tablet Take 1 tablet (500 mg total) by mouth 2 (two) times daily. 08/25/22   Dean Clarity, MD  cyanocobalamin  1000 MCG tablet Take 1 tablet (1,000 mcg total) by mouth daily for Vitamin Supplement. 03/10/21   Dietrich Males, AGNP-C  docusate sodium  (COLACE) 100 MG capsule Take 1 capsule (100 mg total) by mouth daily for constipation. 03/10/21   Dietrich Males, AGNP-C  finasteride  (PROSCAR ) 5 MG tablet Take 1 tablet (5 mg total) by mouth daily. 12/05/20   Espinoza, Alejandra, DO  folic acid  (FOLVITE ) 1 MG tablet Take 1 tablet (1 mg total) by mouth daily for supplement. 03/10/21   Dietrich Males, AGNP-C  gabapentin  (NEURONTIN ) 100 MG capsule Take 1 capsule (100 mg total) by mouth 3 (three) times daily for neuropathy. 03/10/21   Dietrich Males, AGNP-C  HYDROcodone -acetaminophen  (NORCO/VICODIN) 5-325 MG tablet Take 2 tablets by mouth every 6 (six) hours as needed. 10/18/22   Clark, Meghan R, PA-C  Multiple Vitamin (MULTIVITAMIN WITH MINERALS) TABS tablet Take 1 tablet by mouth daily. 12/05/20   Espinoza, Alejandra, DO  nicotine  (NICODERM CQ  - DOSED IN MG/24 HOURS) 21 mg/24hr patch Place 1 patch (21 mg total) onto the skin daily. 12/05/20   Espinoza, Alejandra, DO  nitrofurantoin , macrocrystal-monohydrate, (MACROBID ) 100 MG capsule Take 1 capsule (100 mg total) by mouth 2 (two) times daily. 04/09/22   Jerrol Agent, MD  phenazopyridine  (PYRIDIUM ) 200 MG tablet Take 1 tablet (200 mg total) by mouth 3 (three) times daily. 10/18/22   Clark, Meghan R, PA-C  polyethylene glycol (MIRALAX  / GLYCOLAX ) 17 g packet Take 17 g by mouth daily as needed for mild constipation. 12/05/20   Espinoza, Alejandra, DO  pravastatin  (PRAVACHOL ) 20 MG tablet Take 1 tablet (20 mg total) by mouth daily.  11/28/21     pravastatin  (PRAVACHOL ) 20 MG tablet Take 1 tablet (20 mg total) by mouth daily- NEEDS VISIT 03/06/22     senna (SENOKOT) 8.6 MG TABS tablet Take 2 tablets (17.2 mg total) by mouth daily. 12/07/20   Nguyen, Julie, DO  sertraline  (ZOLOFT ) 25 MG tablet Take 1 tablet (25 mg total) by mouth in the morning for depression. 03/10/21   Dietrich Males, AGNP-C  tamsulosin  (FLOMAX ) 0.4 MG CAPS capsule Take 1 capsule (0.4 mg total) by mouth daily. 03/10/21   Dietrich Males, AGNP-C  thiamine  100 MG tablet Take 1 tablet (100 mg total) by mouth daily. 12/05/20   Espinoza, Alejandra, DO     Allergies: Patient has no known allergies.    Review of Systems  All other systems reviewed and are negative.   Updated Vital Signs BP (!) 143/66   Pulse (!) 49   Temp 97.8 F (36.6 C) (Oral)   Resp 15   SpO2 100%   Physical Exam Vitals and nursing note reviewed.  Constitutional:      Appearance: He is well-developed.  HENT:     Head: Atraumatic.  Cardiovascular:     Rate and Rhythm: Normal rate.  Pulmonary:     Effort: Pulmonary effort is normal.  Musculoskeletal:     Cervical back: Neck supple.  Skin:    General: Skin is warm.  Neurological:     Mental Status: He is alert and oriented to person, place, and time.     Motor: Weakness present.     Comments: Generalized weakness noted.  No sensory deficits for upper or lower extremity.  However patient having difficulty raising his legs and keeping his upper extremity upright due to weakness     (all labs ordered are listed, but only abnormal results are displayed) Labs Reviewed  CBC WITH DIFFERENTIAL/PLATELET - Abnormal; Notable for the following components:      Result Value   Monocytes Absolute 1.2 (*)    All other components within normal limits  URINALYSIS, W/ REFLEX TO CULTURE (INFECTION SUSPECTED) - Abnormal; Notable for the following components:   APPearance CLOUDY (*)    Bilirubin Urine SMALL (*)    Protein, ur 100 (*)    Leukocytes,Ua LARGE (*)    Bacteria, UA MANY (*)    All other components within normal limits  URINE CULTURE  RESP PANEL BY RT-PCR (FLU A&B, COVID) [id number]  COMPREHENSIVE METABOLIC PANEL WITH GFR  CK    EKG: EKG Interpretation Date/Time:  Friday July 21 2023 11:13:40 EDT Ventricular Rate:  60 PR Interval:  270 QRS Duration:  115 QT Interval:  459 QTC Calculation: 459 R Axis:   -40  Text Interpretation: Sinus rhythm Prolonged PR interval Probable left atrial enlargement Nonspecific IVCD with LAD Confirmed by Charlyn Sora ([id number]) on 07/21/2023 1:17:40 PM  Radiology: No  results found.   Procedures   Medications Ordered in the ED  cefTRIAXone  (ROCEPHIN ) 1 g in sodium chloride  0.9 % 100 mL IVPB (has no administration in time range)                                    Medical Decision Making Amount and/or Complexity of Data Reviewed Labs: ordered.    This patient presents to the ED with chief complaint(s) of generalized weakness with pertinent past medical history of TIA, chronic urinary retention with Foley catheter placement.The complaint involves an extensive differential diagnosis  and also carries with it a high risk of complications and morbidity.    The differential diagnosis includes : Severe electrolyte abnormality, severe anemia, occult infection including UTI, myositis/rhabdo myelosis.  Stroke also considered in the differential, but patient has more generalized weakness at this time.  The initial plan is to get basic labs.  We will also get EKG. Holding off on UA at this time.  Patient states that the Foley catheter was changed just last week.  If the basic labs look reassuring, then we will replace the Foley catheter and send new UA.  It does not appear that patient is safe for discharge.   Additional history obtained: Records reviewed Primary Care Documents  Independent labs interpretation:  The following labs were independently interpreted: Patient's COVID test is pending.  UA has pyuria, leukocytes and bacteria.  CK is normal.  CBC, metabolic profile is normal.   Treatment and Reassessment: On reassessment, patient's heart rate has dropped into 40s on occasion.  He is still in sinus rhythm. At this time, we will start ceftriaxone .  Flu /COVID test pending.  He stable for admission.    Final diagnoses:  Acute cystitis without hematuria  Generalized weakness  Lower urinary tract infectious disease    ED Discharge Orders     None         Charlyn Sora, MD 07/21/23 1636

## 2023-07-21 NOTE — Progress Notes (Signed)
 [facility] night cross cover note:   I was notified by our pharmacy tech that this pt's updated MedRec is now completed. Pt's son conveys that the patient does not ta currently ke any prescription medications, but rather he is on 5 over-the-counter medications, as currently listed in the updated med rec.     Eva Pore, DO Hospitalist

## 2023-07-21 NOTE — H&P (Signed)
 History and Physical    Patient: Edgar Mckay. FMW:[id number] DOB: 13-Apr-1935 DOA: 07/21/2023 DOS: the patient was seen and examined on 07/21/2023 . PCP: Darra Hamilton, PA-C  Patient coming from: Home Chief complaint: Chief Complaint  Patient presents with   Weakness   HPI:  Edgar Mckay. is a 88 y.o. 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 Pt states he lives with his son and has signed everything to his son. He is a limited historian.but does say he is having intermittent neck and jaw pain abdominal pain and back pain.   ED Course:  Vital signs in the ED were notable for the following:  Vitals:   07/21/23 1700 07/21/23 1745 07/21/23 1800 07/21/23 1815  BP: (!) 130/58 (!) 142/121 (!) 156/80 (!) 141/68  Pulse: (!) 48 78    Temp:      Resp: 12 19 16 13   SpO2: 100% 100%    TempSrc:      >>ED evaluation thus far shows: CMP is WNL. CBC WNL. Urinalysis shows small bili cloudy urine large leukocytes 11-20 WBCs and many bacteria. EKG today shows sinus rhythm at 60 with a prolonged PR interval of 270 left atrial enlargement QTc of 459, otherwise nonspecific ST changes in lateral lead.   >>While in the ED patient received the following: Medications  cefTRIAXone  (ROCEPHIN ) 1 g in sodium chloride  0.9 % 100 mL IVPB (has no administration in time range)   Review of Systems  Genitourinary:        Foley.   Musculoskeletal:  Positive for falls.   Past Medical History:  Diagnosis Date   Urinary retention    Past Surgical History:  Procedure  Laterality Date   PROSTATE BIOPSY  1990    reports that he has been smoking cigarettes. He has a 60 pack-year smoking history. He has never used smokeless tobacco. He reports that he does not currently use alcohol. He reports that he does not use drugs. No Known Allergies Family History  Family history unknown: Yes   Prior to Admission medications   Medication Sig Start Date End Date Taking? Authorizing Provider  acetaminophen  (TYLENOL ) 325 MG tablet Take 2 tablets (650 mg total) by mouth every 6 (six) hours as needed. 12/05/20   Espinoza, Alejandra, DO  aspirin  81 MG chewable tablet Chew 1 tablet (81 mg total) by mouth daily. 03/10/21   Dietrich Males, AGNP-C  atorvastatin  (LIPITOR) 40 MG tablet Take 1 tablet (40 mg total) by mouth at bedtime. 12/08/20   Macario Dorothyann HERO, MD  ciprofloxacin  (CIPRO ) 500 MG tablet Take 1 tablet (500 mg total) by mouth 2 (two) times daily. 08/25/22   Dean Clarity, MD  cyanocobalamin  1000 MCG tablet Take 1 tablet (1,000 mcg total) by mouth daily for Vitamin Supplement. 03/10/21   Dietrich Males, AGNP-C  docusate sodium  (COLACE) 100 MG capsule Take 1 capsule (100 mg total) by mouth daily for constipation. 03/10/21   Dietrich Males, AGNP-C  finasteride  (PROSCAR ) 5 MG tablet Take 1 tablet (  5 mg total) by mouth daily. 12/05/20   Espinoza, Alejandra, DO  folic acid  (FOLVITE ) 1 MG tablet Take 1 tablet (1 mg total) by mouth daily for supplement. 03/10/21   Dietrich Males, AGNP-C  gabapentin  (NEURONTIN ) 100 MG capsule Take 1 capsule (100 mg total) by mouth 3 (three) times daily for neuropathy. 03/10/21   Dietrich Males, AGNP-C  HYDROcodone -acetaminophen  (NORCO/VICODIN) 5-325 MG tablet Take 2 tablets by mouth every 6 (six) hours as needed. 10/18/22   Clark, Meghan R, PA-C  Multiple Vitamin (MULTIVITAMIN WITH MINERALS) TABS tablet Take 1 tablet by mouth daily. 12/05/20   Espinoza, Alejandra, DO  nicotine  (NICODERM CQ  - DOSED IN MG/24 HOURS) 21 mg/24hr patch Place 1 patch  (21 mg total) onto the skin daily. 12/05/20   Espinoza, Alejandra, DO  nitrofurantoin , macrocrystal-monohydrate, (MACROBID ) 100 MG capsule Take 1 capsule (100 mg total) by mouth 2 (two) times daily. 04/09/22   Jerrol Agent, MD  phenazopyridine  (PYRIDIUM ) 200 MG tablet Take 1 tablet (200 mg total) by mouth 3 (three) times daily. 10/18/22   Clark, Meghan R, PA-C  polyethylene glycol (MIRALAX  / GLYCOLAX ) 17 g packet Take 17 g by mouth daily as needed for mild constipation. 12/05/20   Espinoza, Alejandra, DO  pravastatin  (PRAVACHOL ) 20 MG tablet Take 1 tablet (20 mg total) by mouth daily. 11/28/21     pravastatin  (PRAVACHOL ) 20 MG tablet Take 1 tablet (20 mg total) by mouth daily- NEEDS VISIT 03/06/22     senna (SENOKOT) 8.6 MG TABS tablet Take 2 tablets (17.2 mg total) by mouth daily. 12/07/20   Nguyen, Julie, DO  sertraline  (ZOLOFT ) 25 MG tablet Take 1 tablet (25 mg total) by mouth in the morning for depression. 03/10/21   Dietrich Males, AGNP-C  tamsulosin  (FLOMAX ) 0.4 MG CAPS capsule Take 1 capsule (0.4 mg total) by mouth daily. 03/10/21   Dietrich Males, AGNP-C  thiamine  100 MG tablet Take 1 tablet (100 mg total) by mouth daily. 12/05/20   Chet Mad, DO                                                                                 Vitals:   07/21/23 1700 07/21/23 1745 07/21/23 1800 07/21/23 1815  BP: (!) 130/58 (!) 142/121 (!) 156/80 (!) 141/68  Pulse: (!) 48 78    Resp: 12 19 16 13   Temp:      TempSrc:      SpO2: 100% 100%     Physical Exam Vitals reviewed.  Constitutional:      General: He is not in acute distress.    Appearance: He is not ill-appearing.  HENT:     Head: Normocephalic.  Eyes:     Extraocular Movements: Extraocular movements intact.  Cardiovascular:     Rate and Rhythm: Normal rate and regular rhythm.     Heart sounds: Normal heart sounds.  Pulmonary:     Breath sounds: Normal breath sounds.  Abdominal:     General: There is no distension.      Palpations: Abdomen is soft.     Tenderness: There is no abdominal tenderness.  Neurological:     General: No focal deficit present.     Mental Status:  He is alert and oriented to person, place, and time.     Labs on Admission: I have personally reviewed following labs and imaging studies CBC: Recent Labs  Lab 07/21/23 1310  WBC 8.7  NEUTROABS 5.2  HGB 13.1  HCT 41.2  MCV 86.9  PLT 167   Basic Metabolic Panel: Recent Labs  Lab 07/21/23 1310  NA 141  K 4.0  CL 108  CO2 24  GLUCOSE 78  BUN 10  CREATININE 0.88  CALCIUM  9.4   GFR: CrCl cannot be calculated (Unknown ideal weight.). Liver Function Tests: Recent Labs  Lab 07/21/23 1310  AST 25  ALT 25  ALKPHOS 41  BILITOT 0.5  PROT 6.6  ALBUMIN 3.5   No results for input(s): LIPASE, AMYLASE in the last 168 hours. No results for input(s): AMMONIA in the last 168 hours. Coagulation Profile: No results for input(s): INR, PROTIME in the last 168 hours. Cardiac Enzymes: Recent Labs  Lab 07/21/23 1310  CKTOTAL 197   BNP (last 3 results) No results for input(s): PROBNP in the last 8760 hours. HbA1C: No results for input(s): HGBA1C in the last 72 hours. CBG: No results for input(s): GLUCAP in the last 168 hours. Lipid Profile: No results for input(s): CHOL, HDL, LDLCALC, TRIG, CHOLHDL, LDLDIRECT in the last 72 hours. Thyroid Function Tests: No results for input(s): TSH, T4TOTAL, FREET4, T3FREE, THYROIDAB in the last 72 hours. Anemia Panel: No results for input(s): VITAMINB12, FOLATE, FERRITIN, TIBC, IRON , RETICCTPCT in the last 72 hours. Urine analysis:    Component Value Date/Time   COLORURINE YELLOW 07/21/2023 1442   APPEARANCEUR CLOUDY (A) 07/21/2023 1442   LABSPEC 1.025 07/21/2023 1442   PHURINE 7.0 07/21/2023 1442   GLUCOSEU NEGATIVE 07/21/2023 1442   GLUCOSEU NEGATIVE 03/25/2021 1449   HGBUR NEGATIVE 07/21/2023 1442   BILIRUBINUR SMALL (A) 07/21/2023  1442   KETONESUR NEGATIVE 07/21/2023 1442   PROTEINUR 100 (A) 07/21/2023 1442   UROBILINOGEN 1.0 03/25/2021 1449   NITRITE NEGATIVE 07/21/2023 1442   LEUKOCYTESUR LARGE (A) 07/21/2023 1442   Radiological Exams on Admission: No results found.  Data Reviewed: Relevant notes from primary care and specialist visits, past discharge summaries as available in EHR, including Care Everywhere . Prior diagnostic testing as pertinent to current admission diagnoses, Updated medications and problem lists for reconciliation .ED course, including vitals, labs, imaging, treatment and response to treatment,Triage notes, nursing and pharmacy notes and ED provider's notes.Notable results as noted in HPI.Discussed case with EDMD/ ED APP/ or Specialty MD on call and as needed.   Assessment & Plan  >>Fall/ Weakness: I am concerned that patient is having presyncope and syncopal episodes with his EKG being abnormal we will monitor patient on telemetry unit fall precautions.  Aggressive physical therapy and home PT for evaluation of ambulation safety.Ck is normal  >>Neck / Jaw pain: Will get c spine / Troponin and CTA chest.    >>Abdominal pain: Will also get ct abd pelvis with contrast.    >>Fluctuating BP: Vitals:   07/21/23 1111 07/21/23 1115 07/21/23 1130 07/21/23 1230  BP: (!) 131/55 135/69 (!) 119/56 132/63   07/21/23 1245 07/21/23 1300 07/21/23 1445 07/21/23 1600  BP: 130/67 138/70 128/66 (!) 143/66   07/21/23 1700 07/21/23 1745 07/21/23 1800 07/21/23 1815  BP: (!) 130/58 (!) 142/121 (!) 156/80 (!) 141/68  Suspect orthostatic changes. Will check positional PB and HR and follow.  Unclear if the is due to patient's Flomax . Prn hydralazine .   >> UTI: Continue patient's Rocephin .    >>  Chronic Foley secondary to BPH and urinary retention: Continue indwelling Foley Foley change as needed and urology consult as deemed appropriate.   >>Bradycardia: Intermittent bradycardia will follow on  telemetry 2D echo as deemed appropriate will obtain free T4 and TSH.  >> History of TIA/stroke: Will start patient on aspirin  81 if he agrees for secondary prevention of stroke.  Patient's fluctuating blood pressure has to be addressed to prevent future falls and dizziness and TIA.    DVT prophylaxis:  Heparin .  Consults:  None  Advance Care Planning:    Code Status: Full Code   Family Communication:  None Disposition Plan:  To be determined Severity of Illness: Inpatient.   Unresulted Labs (From admission, onward)     Start     Ordered   07/22/23 0500  Comprehensive metabolic panel  Tomorrow morning,   R        07/21/23 1905   07/22/23 0500  CBC  Tomorrow morning,   R        07/21/23 1905   07/21/23 1902  Hemoglobin A1c  Once,   R       Comments: To assess prior glycemic control    07/21/23 1905   07/21/23 1902  CBC  (heparin )  Once,   R       Comments: Baseline for heparin  therapy IF NOT ALREADY DRAWN.  Notify MD if PLT < 100 K.    07/21/23 1905   07/21/23 1902  Creatinine, serum  (heparin )  Once,   R       Comments: Baseline for heparin  therapy IF NOT ALREADY DRAWN.    07/21/23 1905   07/21/23 1855  C-reactive protein  Add-on,   AD        07/21/23 1855   07/21/23 1731  TSH  Add-on,   AD        07/21/23 1730   07/21/23 1731  T4, free  Add-on,   AD        07/21/23 1730   07/21/23 1442  Urine Culture  Once,   R        07/21/23 1442            Meds ordered this encounter  Medications   cefTRIAXone  (ROCEPHIN ) 1 g in sodium chloride  0.9 % 100 mL IVPB    Antibiotic Indication::   UTI   aspirin  chewable tablet 81 mg   cyanocobalamin  (VITAMIN B12) tablet 1,000 mcg   finasteride  (PROSCAR ) tablet 5 mg   gabapentin  (NEURONTIN ) capsule 100 mg   pravastatin  (PRAVACHOL ) tablet 20 mg   sertraline  (ZOLOFT ) tablet 25 mg   tamsulosin  (FLOMAX ) capsule 0.4 mg   thiamine  (VITAMIN B1) tablet 100 mg   HYDROcodone -acetaminophen  (NORCO/VICODIN) 5-325 MG per tablet 2  tablet    Refill:  0   cefTRIAXone  (ROCEPHIN ) 2 g in sodium chloride  0.9 % 100 mL IVPB    Antibiotic Indication::   UTI   heparin  injection 5,000 Units   sodium chloride  flush (NS) 0.9 % injection 3 mL   0.9 %  sodium chloride  infusion   OR Linked Order Group    acetaminophen  (TYLENOL ) tablet 650 mg    acetaminophen  (TYLENOL ) suppository 650 mg   morphine  (PF) 2 MG/ML injection 2 mg   insulin  aspart (novoLOG ) injection 0-9 Units    Correction coverage::   Sensitive (thin, NPO, renal)    CBG < 70::   Implement Hypoglycemia Standing Orders and refer to Hypoglycemia Standing Orders sidebar report    CBG 70 -  120::   0 units    CBG 121 - 150::   1 unit    CBG 151 - 200::   2 units    CBG 201 - 250::   3 units    CBG 251 - 300::   5 units    CBG 301 - 350::   7 units    CBG 351 - 400:   9 units    CBG > 400:   call MD and obtain STAT lab verification   hydrALAZINE  (APRESOLINE ) injection 5 mg     Orders Placed This Encounter  Procedures   Urine Culture   Resp Panel by RT-PCR (Flu A&B, Covid) Anterior Nasal Swab   CT Angio Chest/Abd/Pel for Dissection W and/or W/WO   DG Cervical Spine 2 or 3 views   Comprehensive metabolic panel   CBC with Differential   CK   Urinalysis, w/ Reflex to Culture (Infection Suspected) -Urine, Clean Catch   TSH   T4, free   C-reactive protein   Hemoglobin A1c   CBC   Creatinine, serum   Comprehensive metabolic panel   CBC   Diet Carb Modified Fluid consistency: Thin; Room service appropriate? Yes   Orthostatic vital signs   Apply Diabetes Mellitus Care Plan   STAT CBG when hypoglycemia is suspected. If treated, recheck every 15 minutes after each treatment until CBG >/= 70 mg/dl   Refer to Hypoglycemia Protocol Sidebar Report for treatment of CBG < 70 mg/dl   No HS correction Insulin    Maintain IV access   Vital signs   Notify physician (specify)   Mobility Protocol: No Restrictions RN to initiate protocols based on patient's level of care    Refer to Sidebar Report Refer to ICU, Med-Surg, Progressive, and Step-Down Mobility Protocol Sidebars   Initiate Adult Central Line Maintenance and Catheter Protocol for patients with central line (CVC, PICC, Port, Hemodialysis, Trialysis)   Daily weights   Intake and Output   Do not place and if present remove PureWick   Initiate Oral Care Protocol   Initiate Carrier Fluid Protocol   RN may order General Admission PRN Orders utilizing General Admission PRN medications (through manage orders) for the following patient needs: allergy symptoms (Claritin), cold sores (Carmex), cough (Robitussin DM), eye irritation (Liquifilm Tears), hemorrhoids (Tucks), indigestion (Maalox), minor skin irritation (Hydrocortisone Cream), muscle pain (Ben Gay), nose irritation (saline nasal spray) and sore throat (Chloraseptic spray).   Cardiac Monitoring - Continuous Indefinite   Full code   Consult for [facility] Admission   Pulse oximetry check with vital signs   Oxygen therapy Mode or (Route): Nasal cannula; Liters Per Minute: 2; Keep O2 saturation between: greater than 92 %   EKG 12-Lead   ED EKG   EKG 12-Lead   EKG 12-Lead   Admit to Inpatient (patient's expected length of stay will be greater than 2 midnights or inpatient only procedure)   Aspiration precautions   Fall precautions    Author: Mario LULLA Blanch, MD 12 pm- 8 pm. [facility]. 07/21/2023 7:08 PM Please note for any communication after hours contact [facility] Assigned provider on call on Amion.

## 2023-07-21 NOTE — ED Triage Notes (Signed)
 Pt BIB [facility] from home for a fall and generalized weakness ongoing for 2 days. Pt was changing his bent over changing his foley bag and his legs gave out on him. Pt denies hitting head, no LOC.   BP 142/76 HR 72 97% room air   CBG 190

## 2023-07-22 ENCOUNTER — Other Ambulatory Visit: Payer: Self-pay

## 2023-07-22 DIAGNOSIS — N390 Urinary tract infection, site not specified: Secondary | ICD-10-CM | POA: Diagnosis present

## 2023-07-22 DIAGNOSIS — W19XXXA Unspecified fall, initial encounter: Secondary | ICD-10-CM | POA: Diagnosis not present

## 2023-07-22 DIAGNOSIS — Y92009 Unspecified place in unspecified non-institutional (private) residence as the place of occurrence of the external cause: Secondary | ICD-10-CM | POA: Diagnosis not present

## 2023-07-22 LAB — CBC
HCT: 39 % (ref 39.0–52.0)
Hemoglobin: 12.5 g/dL — ABNORMAL LOW (ref 13.0–17.0)
MCH: 27.7 pg (ref 26.0–34.0)
MCHC: 32.1 g/dL (ref 30.0–36.0)
MCV: 86.5 fL (ref 80.0–100.0)
Platelets: 156 K/uL (ref 150–400)
RBC: 4.51 MIL/uL (ref 4.22–5.81)
RDW: 14.2 % (ref 11.5–15.5)
WBC: 7.2 K/uL (ref 4.0–10.5)
nRBC: 0 % (ref 0.0–0.2)

## 2023-07-22 LAB — BASIC METABOLIC PANEL WITH GFR
Anion gap: 13 (ref 5–15)
BUN: 12 mg/dL (ref 8–23)
CO2: 23 mmol/L (ref 22–32)
Calcium: 9.2 mg/dL (ref 8.9–10.3)
Chloride: 105 mmol/L (ref 98–111)
Creatinine, Ser: 0.8 mg/dL (ref 0.61–1.24)
GFR, Estimated: >60 mL/min (ref >60)
Glucose, Bld: 134 mg/dL — ABNORMAL HIGH (ref 70–99)
Potassium: 3.9 mmol/L (ref 3.5–5.1)
Sodium: 141 mmol/L (ref 135–145)

## 2023-07-22 LAB — COMPREHENSIVE METABOLIC PANEL WITH GFR
ALT: 22 U/L (ref 0–44)
AST: 23 U/L (ref 15–41)
Albumin: 3.1 g/dL — ABNORMAL LOW (ref 3.5–5.0)
Alkaline Phosphatase: 36 U/L — ABNORMAL LOW (ref 38–126)
Anion gap: 5 (ref 5–15)
BUN: 10 mg/dL (ref 8–23)
CO2: 23 mmol/L (ref 22–32)
Calcium: 8.6 mg/dL — ABNORMAL LOW (ref 8.9–10.3)
Chloride: 110 mmol/L (ref 98–111)
Creatinine, Ser: 0.8 mg/dL (ref 0.61–1.24)
GFR, Estimated: >60 mL/min (ref >60)
Glucose, Bld: 113 mg/dL — ABNORMAL HIGH (ref 70–99)
Potassium: 3.7 mmol/L (ref 3.5–5.1)
Sodium: 138 mmol/L (ref 135–145)
Total Bilirubin: 0.6 mg/dL (ref 0.0–1.2)
Total Protein: 6.2 g/dL — ABNORMAL LOW (ref 6.5–8.1)

## 2023-07-22 LAB — PHOSPHORUS: Phosphorus: 3.1 mg/dL (ref 2.5–4.6)

## 2023-07-22 LAB — TROPONIN I (HIGH SENSITIVITY): Troponin I (High Sensitivity): 15 ng/L (ref <18)

## 2023-07-22 LAB — GLUCOSE, CAPILLARY: Glucose-Capillary: 145 mg/dL — ABNORMAL HIGH (ref 70–99)

## 2023-07-22 LAB — MAGNESIUM: Magnesium: 1.6 mg/dL — ABNORMAL LOW (ref 1.7–2.4)

## 2023-07-22 MED ORDER — MAGNESIUM SULFATE 2 GM/50ML IV SOLN
2.0000 g | Freq: Once | INTRAVENOUS | Status: AC
  Administered 2023-07-22: 2 g via INTRAVENOUS
  Filled 2023-07-22: qty 50

## 2023-07-22 MED ORDER — ORAL CARE MOUTH RINSE
15.0000 mL | OROMUCOSAL | Status: DC | PRN
Start: 2023-07-22 — End: 2023-07-27

## 2023-07-22 MED ORDER — CHLORHEXIDINE GLUCONATE CLOTH 2 % EX PADS
6.0000 | MEDICATED_PAD | Freq: Every day | CUTANEOUS | Status: DC
  Administered 2023-07-22 – 2023-07-27 (×7): 6 via TOPICAL

## 2023-07-22 NOTE — Progress Notes (Signed)
 [facility] night cross cover note:   As requested by admitting hospitalist, I followed-up on the following studies that were previously pending:  I followed up on repeat EKG, which showed sinus arrhythmia, prolonged PR interval of 277 ms, heart rate 60, no evidence of T wave changes, less than 1 mm ST depression in V6, which appears similar to EKG performed earlier today, without evidence of new ST changes, including no evidence of ST elevation.  I also followed up on repeat troponin, which was 17, compared to previous value of 15.  I followed up on plain films of the C-spine which showed no evidence of acute cervical spine fracture or listhesis injury.  I also followed up on CTA chest, abdomen, pelvis with dissection protocol, which showed no evidence of aortic aneurysm, dissection, rupture, nor any evidence of acute pulmonary embolism nor any evidence of infiltrate, edema, effusion,, or pneumothorax.  This imaging also showed a small hiatal hernia, small fat-containing left inguinal hernia, as well as mild ascending colonic diverticulosis without evidence of diverticulitis as well as evidence of cholelithiasis without evidence of acute cholecystitis.  Orthostatic vital signs are currently pending.     Eva Pore, DO Hospitalist

## 2023-07-22 NOTE — Evaluation (Signed)
 Occupational Therapy Evaluation Patient Details Name: Edgar Mckay. MRN: [id number] DOB: 11-Sep-1935 Today's Date: 07/22/2023   History of Present Illness   88 yo male found to have UTI s/p fall with generalize weakness  PMH chronic urinary retention indwelling foley catheter, hx of falls, positive RPR latent syphilis s/p tx 2002     Clinical Impressions Pt admitted based on above, and was seen based on problem list below. PTA pt was living with son and his family and was independent with ADLs. Today pt is requiring set up  to mod assist for ADLs. Bed mobility was mod I and functional transfers are  mod assist. Pt was mod to max assist for STS, significantly decreased standing tolerance, heavy reliance on BUE support. Attempted orthostatic vitals, however pt with difficulty maintaining stand. See additional note for further details. Pt would benefit from <3 hours of skilled rehab daily. OT will continue to follow acutely to maximize functional independence.        If plan is discharge home, recommend the following:   A lot of help with walking and/or transfers;A lot of help with bathing/dressing/bathroom;Direct supervision/assist for medications management     Functional Status Assessment   Patient has had a recent decline in their functional status and demonstrates the ability to make significant improvements in function in a reasonable and predictable amount of time.     Equipment Recommendations   Other (comment) (Defer to next venue)     Recommendations for Other Services         Precautions/Restrictions   Precautions Precautions: Fall Recall of Precautions/Restrictions: Intact Restrictions Weight Bearing Restrictions Per Provider Order: No     Mobility Bed Mobility Overal bed mobility: Modified Independent             General bed mobility comments: No assist, increased time    Transfers Overall transfer level: Needs assistance   Transfers:  Sit to/from Stand, Bed to chair/wheelchair/BSC Sit to Stand: Mod assist, Max assist     Step pivot transfers: Min assist     General transfer comment: Inital STS from low bed max assist, from recliner mod assist, not tolerating step pivots      Balance Overall balance assessment: Needs assistance Sitting-balance support: No upper extremity supported, Feet supported Sitting balance-Leahy Scale: Fair     Standing balance support: Bilateral upper extremity supported, During functional activity, Reliant on assistive device for balance Standing balance-Leahy Scale: Poor Standing balance comment: Reliant on BUE suport         ADL either performed or assessed with clinical judgement   ADL Overall ADL's : Needs assistance/impaired Eating/Feeding: Set up;Sitting   Grooming: Set up;Sitting   Upper Body Bathing: Set up;Sitting   Lower Body Bathing: Moderate assistance;Sit to/from stand   Upper Body Dressing : Set up;Sitting   Lower Body Dressing: Moderate assistance;Sit to/from stand   Toilet Transfer: Moderate assistance;Stand-pivot;Rolling walker (2 wheels)   Toileting- Clothing Manipulation and Hygiene: Minimal assistance;Sit to/from stand;Sitting/lateral lean       Functional mobility during ADLs: Moderate assistance;Rolling walker (2 wheels) General ADL Comments: Significantly decreased standing tolerance     Vision Patient Visual Report: Blurring of vision Additional Comments: Pt reports decreased vision over recent years. Reported to PCP            Pertinent Vitals/Pain Pain Assessment Pain Assessment: Faces Faces Pain Scale: Hurts little more Pain Location: L hip and lower back Pain Descriptors / Indicators: Discomfort Pain Intervention(s): Monitored during session  Extremity/Trunk Assessment Upper Extremity Assessment Upper Extremity Assessment: Overall WFL for tasks assessed   Lower Extremity Assessment Lower Extremity Assessment: Defer to PT  evaluation   Cervical / Trunk Assessment Cervical / Trunk Assessment: Normal   Communication Communication Communication: Impaired Factors Affecting Communication: Reduced clarity of speech   Cognition Arousal: Alert Behavior During Therapy: WFL for tasks assessed/performed Cognition: No apparent impairments     OT - Cognition Comments: Pt self reporting stm deficits but overall WFL       Following commands: Intact       Cueing  General Comments   Cueing Techniques: Verbal cues;Tactile cues  orthostatic vitals recorded, pt asymptomatic throughout see additional note for further details           Home Living Family/patient expects to be discharged to:: Private residence Living Arrangements: Children Available Help at Discharge: Family;Available 24 hours/day Type of Home: House Home Access: Stairs to enter [employer] of Steps: 3 Entrance Stairs-Rails: None Home Layout: One level     Bathroom Shower/Tub: Chief Strategy Officer: Standard Bathroom Accessibility: No   Home Equipment: Rollator (4 wheels);Other (comment);BSC/3in1;Shower seat;Adaptive equipment (tripod cane) Adaptive Equipment: Reacher        Prior Functioning/Environment Prior Level of Function : Independent/Modified Independent       Mobility Comments: Use of rollator mostly, denies falls outside of fall for admission ADLs Comments: Use of reacher for LB ADLs, son manages meds and finances    OT Problem List: Decreased strength   OT Treatment/Interventions: Self-care/ADL training;Therapeutic exercise;Energy conservation;DME and/or AE instruction;Therapeutic activities;Patient/family education;Balance training      OT Goals(Current goals can be found in the care plan section)   Acute Rehab OT Goals Patient Stated Goal: To get better OT Goal Formulation: With patient Time For Goal Achievement: 08/05/23 Potential to Achieve Goals: Good   OT Frequency:  Min  2X/week       AM-PAC OT 6 Clicks Daily Activity     Outcome Measure Help from another person eating meals?: None Help from another person taking care of personal grooming?: A Little Help from another person toileting, which includes using toliet, bedpan, or urinal?: A Lot Help from another person bathing (including washing, rinsing, drying)?: A Lot Help from another person to put on and taking off regular upper body clothing?: A Little Help from another person to put on and taking off regular lower body clothing?: A Lot 6 Click Score: 16   End of Session Equipment Utilized During Treatment: Gait belt;Rolling walker (2 wheels) Nurse Communication: Mobility status  Activity Tolerance: Patient limited by fatigue Patient left: in chair;with call bell/phone within reach;with chair alarm set  OT Visit Diagnosis: Unsteadiness on feet (R26.81);Other abnormalities of gait and mobility (R26.89);Muscle weakness (generalized) (M62.81)                Time: 8398-8372 OT Time Calculation (min): 26 min Charges:  OT General Charges $OT Visit: 1 Visit OT Evaluation $OT Eval Moderate Complexity: 1 Mod OT Treatments $Self Care/Home Management : 8-22 mins  Adrianne BROCKS, OT  Acute Rehabilitation Services Office [phone] Secure chat preferred   Adrianne GORMAN Savers 07/22/2023, 4:46 PM

## 2023-07-22 NOTE — Progress Notes (Signed)
 Pt is transferred from ED to 4E04. He is alert and fully oriented x 3, afebrile, on room air, normal respiratory rate and effort, stable hemodynamically, sinus brady cardia on the monitor. Pt has no obvious acute distress at arrival.   Pt's urine from chronically use Foley catheter is malodorous and  cloudy. We change a new urine bag. CHG bath, Peri-care and Foley are provided. Call bell is within reachable. CCMD is called with the second verified. We will continue to monitor.  Wendi Dash, RN

## 2023-07-22 NOTE — Progress Notes (Signed)
 Orthostatic BPs  Supine 117/57  Sitting 123/57  Standing trial 1 120/57  Standing trial 2 (longest trial) 113/46   Orthostatics recorded during OT eval. Pt unable to maintain standing long enough for true standing BP. See OT evaluation for further details.  Estelle Greenleaf C, OT  Acute Rehabilitation Services Office [phone] Secure chat preferred

## 2023-07-22 NOTE — Progress Notes (Addendum)
 Progress Note    Edgar MACNAUGHTON Sr.  FMW:[id number] DOB: 1935-04-06  DOA: 07/21/2023 PCP: Darra Hamilton, PA-C      Brief Narrative:    Medical records reviewed and are as summarized below:  Edgar [name] is a 88 y.o. 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  Patient said he tripped over his Foley catheter and fell in the bathroom.  Reportedly, he had initially refused to go to the hospital but on reflection, he noted that he was too weak to move or get up so he decided to go to the ED for further management.  He said his Foley catheter was changed in July 14, 2023.  He was found to have abnormal urinalysis (cloudy urine with large leukocytes, 11-20 WBCs per hpf, 11-20 RBCs per hpf)    Assessment/Plan:   Principal Problem:   Fall at home, initial encounter    Body mass index is 34.18 kg/m.  (Obesity)   S/p mechanical fall at home, general weakness: Check orthostatic vital signs.  Consult PT and OT  Neck and jaw pain: Improved. No acute fracture or listhesis of the visualized cervical spine and cervical spine x-ray .  Abdominal pain, acute complicated UTI with chronic indwelling Foley catheter in place: Abdominal pain has improved.  Continue IV ceftriaxone .  Follow-up urine culture. Discontinue IV fluids   Hypomagnesemia: Replete magnesium  with IV magnesium  sulfate   Sinus bradycardia, first-degree AV block: Asymptomatic.  Heart rate was in the upper 40s and 50s overnight.  However heart rate has been in the 70s and 60s during the day.   Chronic urinary retention, BPH: Foley catheter was changed on 07/22/2023   History of TIA on aspirin    No acute abnormality on CT angio chest abdomen and pelvis:  IMPRESSION: 1. No  evidence of acute aortic syndrome. 2. Moderate left anterior descending coronary artery calcification. 3. No pulmonary embolism identified. 4. Small hiatal hernia. 5. Cholelithiasis. 6. Mild ascending colonic diverticulosis. 7. Marked prostatic hypertrophy with bladder wall thickening in keeping with changes of chronic bladder outlet obstruction. Foley catheter balloon is seen within a decompressed bladder lumen. 8. Small fat containing left inguinal hernia.    Diet Order             Diet Heart Room service appropriate? Yes with Assist; Fluid consistency: Thin  Diet effective now                            Consultants: None  Procedures: None    Medications:    aspirin   81 mg Oral Daily   Chlorhexidine  Gluconate Cloth  6 each Topical Daily   cyanocobalamin   1,000 mcg Oral Daily   finasteride   5 mg Oral Daily   gabapentin   100 mg Oral TID   heparin   5,000 Units Subcutaneous Q8H   pravastatin   20 mg Oral q1800   sertraline   25 mg Oral QHS   sodium chloride  flush  3 mL Intravenous Q12H   tamsulosin   0.4 mg Oral Daily   thiamine   100 mg Oral Daily   Continuous Infusions:  sodium chloride  50 mL/hr at 07/22/23 0642   cefTRIAXone  (ROCEPHIN )  IV 2 g (07/22/23 0856)     Anti-infectives (From admission,  onward)    Start     Dose/Rate Route Frequency Ordered Stop   07/22/23 1000  cefTRIAXone  (ROCEPHIN ) 2 g in sodium chloride  0.9 % 100 mL IVPB        2 g 200 mL/hr over 30 Minutes Intravenous Every 24 hours 07/21/23 1855     07/21/23 1630  cefTRIAXone  (ROCEPHIN ) 1 g in sodium chloride  0.9 % 100 mL IVPB        1 g 200 mL/hr over 30 Minutes Intravenous  Once 07/21/23 1626 07/21/23 1824              Family Communication/Anticipated D/C date and plan/Code Status   DVT prophylaxis: heparin  injection 5,000 Units Start: 07/21/23 2200     Code Status: Full Code  Family Communication: None Disposition Plan: Plan to discharge home   Status is:  Inpatient Remains inpatient appropriate because: General weakness, acute UTI       Subjective:   Interval events noted.  He complains of cramps on the flanks especially on the right side.  No fever, chills, shortness of breath, chest pain, abdominal pain, vomiting or diarrhea.  Objective:    Vitals:   07/22/23 0253 07/22/23 0300 07/22/23 0330 07/22/23 0759  BP:  137/80 137/80 123/65  Pulse:  (!) 53 (!) 58 (!) 52  Resp:  17 18 19   Temp:  97.7 F (36.5 C)  97.8 F (36.6 C)  TempSrc:  Oral  Oral  SpO2:  97% 99% 96%  Weight: 105 kg     Height: 5' 9 (1.753 m)      No data found.   Intake/Output Summary (Last 24 hours) at 07/22/2023 1101 Last data filed at 07/22/2023 9357 Gross per 24 hour  Intake 397.08 ml  Output 370 ml  Net 27.08 ml   Filed Weights   07/22/23 0253  Weight: 105 kg    Exam:   GEN: NAD SKIN: Warm and dry EYES: No pallor or icterus ENT: MMM CV: RRR PULM: CTA B ABD: soft, obese, NT, +BS CNS: AAO x 3, non focal EXT: No edema or tenderness GU: No CVA tenderness.  Foley catheter draining amber urine.        Data Reviewed:   I have personally reviewed following labs and imaging studies:  Labs: Labs show the following:   Basic Metabolic Panel: Recent Labs  Lab 07/21/23 1310 07/21/23 1957 07/22/23 0338  NA 141  --  138  K 4.0  --  3.7  CL 108  --  110  CO2 24  --  23  GLUCOSE 78  --  113*  BUN 10  --  10  CREATININE 0.88 0.83 0.80  CALCIUM  9.4  --  8.6*  MG  --   --  1.6*  PHOS  --   --  3.1   GFR Estimated Creatinine Clearance: 80.6 mL/min (by C-G formula based on SCr of 0.8 mg/dL). Liver Function Tests: Recent Labs  Lab 07/21/23 1310 07/22/23 0338  AST 25 23  ALT 25 22  ALKPHOS 41 36*  BILITOT 0.5 0.6  PROT 6.6 6.2*  ALBUMIN 3.5 3.1*   No results for input(s): LIPASE, AMYLASE in the last 168 hours. No results for input(s): AMMONIA in the last 168 hours. Coagulation profile No results for input(s): INR,  PROTIME in the last 168 hours.  CBC: Recent Labs  Lab 07/21/23 1310 07/21/23 1957 07/22/23 0338  WBC 8.7 7.9 7.2  NEUTROABS 5.2  --   --   HGB 13.1  12.9* 12.5*  HCT 41.2 41.3 39.0  MCV 86.9 86.9 86.5  PLT 167 168 156   Cardiac Enzymes: Recent Labs  Lab 07/21/23 1310  CKTOTAL 197   BNP (last 3 results) No results for input(s): PROBNP in the last 8760 hours. CBG: Recent Labs  Lab 07/21/23 2145  GLUCAP 105*   D-Dimer: No results for input(s): DDIMER in the last 72 hours. Hgb A1c: No results for input(s): HGBA1C in the last 72 hours. Lipid Profile: No results for input(s): CHOL, HDL, LDLCALC, TRIG, CHOLHDL, LDLDIRECT in the last 72 hours. Thyroid function studies: Recent Labs    07/21/23 1957  TSH 2.529   Anemia work up: No results for input(s): VITAMINB12, FOLATE, FERRITIN, TIBC, IRON , RETICCTPCT in the last 72 hours. Sepsis Labs: Recent Labs  Lab 07/21/23 1310 07/21/23 1957 07/22/23 0338  WBC 8.7 7.9 7.2    Microbiology Recent Results (from the past 240 hours)  Resp Panel by RT-PCR (Flu A&B, Covid) Anterior Nasal Swab     Status: None   Collection Time: 07/21/23  5:12 PM   Specimen: Anterior Nasal Swab  Result Value Ref Range Status   SARS Coronavirus 2 by RT PCR NEGATIVE NEGATIVE Final   Influenza A by PCR NEGATIVE NEGATIVE Final   Influenza B by PCR NEGATIVE NEGATIVE Final    Comment: (NOTE) The Xpert Xpress SARS-CoV-2/FLU/RSV plus assay is intended as an aid in the diagnosis of influenza from Nasopharyngeal swab specimens and should not be used as a sole basis for treatment. Nasal washings and aspirates are unacceptable for Xpert Xpress SARS-CoV-2/FLU/RSV testing.  Fact Sheet for Patients: BloggerCourse.com  Fact Sheet for Healthcare Providers: SeriousBroker.it  This test is not yet approved or cleared by the [place]  FDA and has been authorized for  detection and/or diagnosis of SARS-CoV-2 by FDA under an Emergency Use Authorization (EUA). This EUA will remain in effect (meaning this test can be used) for the duration of the COVID-19 declaration under Section 564(b)(1) of the Act, 21 U.S.C. section 360bbb-3(b)(1), unless the authorization is terminated or revoked.  Performed at [facility], [address], [place], [place] [address]     Procedures and diagnostic studies:  CT Angio Chest/Abd/Pel for Dissection W and/or W/WO Result Date: 07/21/2023 CLINICAL DATA:  Thoracoabdominal aortic aneurysm, pulmonary embolism, upper chest pain, unspecified abdominal pain. EXAM: CT ANGIOGRAPHY CHEST, ABDOMEN AND PELVIS TECHNIQUE: Non-contrast CT of the chest was initially obtained. Multidetector CT imaging through the chest, abdomen and pelvis was performed using the standard protocol during bolus administration of intravenous contrast. Multiplanar reconstructed images and MIPs were obtained and reviewed to evaluate the vascular anatomy. RADIATION DOSE REDUCTION: This exam was performed according to the departmental dose-optimization program which includes automated exposure control, adjustment of the mA and/or kV according to patient size and/or use of iterative reconstruction technique. CONTRAST:  OMNIPAQUE  IOHEXOL  350 MG/ML SOLN COMPARISON:  CT abdomen pelvis 11/30/2020 FINDINGS: CTA CHEST FINDINGS Cardiovascular: Moderate left anterior descending coronary artery calcification. Global cardiac size within normal limits. No pericardial effusion. Adequate opacification of the pulmonary arterial tree. No intraluminal filling defect identified to suggest acute or chronic pulmonary embolus. Central pulmonary arteries are of normal caliber. Thoracic aorta is normal in course and caliber. No intramural hematoma, dissection, or aneurysm. Mild atherosclerotic calcification within the thoracic aorta. Mediastinum/Nodes: No enlarged mediastinal, hilar, or  axillary lymph nodes. Thyroid gland, trachea, and esophagus demonstrate no significant findings. Small hiatal hernia Lungs/Pleura: Respiratory motion artifact. No confluent pulmonary infiltrate. No  pneumothorax or pleural effusion. No central obstructing lesion. Musculoskeletal: No chest wall abnormality. No acute or significant osseous findings. Review of the MIP images confirms the above findings. CTA ABDOMEN AND PELVIS FINDINGS VASCULAR Aorta: The abdominal aorta is normal in course and caliber. No intramural hematoma, dissection, or aneurysm. Mild atherosclerotic calcification Celiac: Patent without evidence of aneurysm, dissection, vasculitis or significant stenosis. SMA: Patent without evidence of aneurysm, dissection, vasculitis or significant stenosis. Renals: Both renal arteries are patent without evidence of aneurysm, dissection, vasculitis, fibromuscular dysplasia or significant stenosis. IMA: Accessory left lower pole renal artery. Inflow: Patent without evidence of aneurysm, dissection, vasculitis or significant stenosis. Veins: No obvious venous abnormality within the limitations of this arterial phase study. Review of the MIP images confirms the above findings. NON-VASCULAR Hepatobiliary: Cholelithiasis without superimposed pericholecystic inflammatory change. Liver unremarkable on this arterial phase examination. No intra or extrahepatic biliary ductal dilation. Pancreas: Unremarkable Spleen: Unremarkable Adrenals/Urinary Tract: The adrenal glands are unremarkable. The kidneys are normal. The bladder is thick walled, suggesting changes chronic bladder outlet obstruction. Foley catheter balloon is seen within a decompressed bladder lumen Stomach/Bowel: Moderate colonic stool burden. Mild ascending colonic diverticulosis. Stomach, small bowel, and large bowel are otherwise unremarkable. No evidence of obstruction or focal inflammation. Appendix is normal. No free intraperitoneal gas or fluid.  Lymphatic: No pathologic adenopathy within the abdomen and pelvis. Reproductive: Marked prostatic hypertrophy Other: Small fat containing left inguinal hernia. Musculoskeletal: No acute bone abnormality. No lytic or blastic bone lesion. Osseous structures are age appropriate. Review of the MIP images confirms the above findings. IMPRESSION: 1. No evidence of acute aortic syndrome. 2. Moderate left anterior descending coronary artery calcification. 3. No pulmonary embolism identified. 4. Small hiatal hernia. 5. Cholelithiasis. 6. Mild ascending colonic diverticulosis. 7. Marked prostatic hypertrophy with bladder wall thickening in keeping with changes of chronic bladder outlet obstruction. Foley catheter balloon is seen within a decompressed bladder lumen. 8. Small fat containing left inguinal hernia. Aortic Atherosclerosis (ICD10-I70.0). Electronically Signed   By: Dorethia Molt M.D.   On: 07/21/2023 21:19   DG Cervical Spine 2 or 3 views Result Date: 07/21/2023 CLINICAL DATA:  Fall, neck pain EXAM: CERVICAL SPINE - 2-3 VIEW COMPARISON:  None Available. FINDINGS: Cervical spine is visualized from the occiput through C5-6 on lateral examination. C6-T1 is obscured by overlying soft tissues. Mild straightening the visualized cervical spine. The examination is slightly limited by obliquity, however, no acute fracture or listhesis of the visualized cervical spine. Vertebral body heights are preserved. Mild disc space narrowing and endplate remodeling at C4-5 and C5-6 is present in keeping with changes of mild to moderate degenerative disc disease. Multilevel facet arthrosis is not well assessed on this examination. Prevertebral soft tissues are not thickened. IMPRESSION: 1. No acute fracture or listhesis of the visualized cervical spine. C6-T1 not visualized. Electronically Signed   By: Dorethia Molt M.D.   On: 07/21/2023 19:40               LOS: 1 day   Betania Dizon  [facility]   Pager on  [email or web address]. If 7PM-7AM, please contact night-coverage at [email or web address]     07/22/2023, 11:01 AM

## 2023-07-22 NOTE — Plan of Care (Signed)

## 2023-07-23 DIAGNOSIS — Y92009 Unspecified place in unspecified non-institutional (private) residence as the place of occurrence of the external cause: Secondary | ICD-10-CM | POA: Diagnosis not present

## 2023-07-23 DIAGNOSIS — N390 Urinary tract infection, site not specified: Secondary | ICD-10-CM | POA: Diagnosis not present

## 2023-07-23 DIAGNOSIS — W19XXXA Unspecified fall, initial encounter: Secondary | ICD-10-CM | POA: Diagnosis not present

## 2023-07-23 LAB — GLUCOSE, CAPILLARY
Glucose-Capillary: 133 mg/dL — ABNORMAL HIGH (ref 70–99)
Glucose-Capillary: 107 mg/dL — ABNORMAL HIGH (ref 70–99)
Glucose-Capillary: 152 mg/dL — ABNORMAL HIGH (ref 70–99)
Glucose-Capillary: 148 mg/dL — ABNORMAL HIGH (ref 70–99)

## 2023-07-23 LAB — MAGNESIUM: Magnesium: 2 mg/dL (ref 1.7–2.4)

## 2023-07-23 NOTE — Plan of Care (Signed)

## 2023-07-23 NOTE — Evaluation (Signed)
 Physical Therapy Evaluation Patient Details Name: Edgar TABORA Sr. MRN: [id number] DOB: 1935/09/13 Today's Date: 07/23/2023  History of Present Illness  88 yo male found to have UTI s/p fall with generalize weakness  PMH chronic urinary retention indwelling foley catheter, hx of falls, positive RPR latent syphilis s/p tx 2002  Clinical Impression  Pt is presenting below baseline level of functioning. Currently pt is Min A to supervision for bed mobility, Mod to Min A for sit to stand with RW and very limited gait distance at Min A for non-functional distance. Due to pt current functional status, home set up and available assistance at home recommending skilled physical therapy services < 3 hours/day in order to address strength, balance and functional mobility to decrease risk for falls, injury, immobility, skin break down and re-hospitalization.          If plan is discharge home, recommend the following: A lot of help with walking and/or transfers;Help with stairs or ramp for entrance;Assist for transportation;Assistance with cooking/housework   Can travel by private vehicle   No    Equipment Recommendations Rolling walker (2 wheels);BSC/3in1;Other (comment) (24/7 physical assist.)     Functional Status Assessment Patient has had a recent decline in their functional status and demonstrates the ability to make significant improvements in function in a reasonable and predictable amount of time.     Precautions / Restrictions Precautions Precautions: Fall Recall of Precautions/Restrictions: Intact Restrictions Weight Bearing Restrictions Per Provider Order: No      Mobility  Bed Mobility Overal bed mobility: Needs Assistance Bed Mobility: Supine to Sit, Sit to Supine     Supine to sit: Supervision Sit to supine: Min assist   General bed mobility comments: supervision with increased time to get EOB and Min A for LE to get back to bed.    Transfers Overall transfer level:  Needs assistance Equipment used: Rolling walker (2 wheels) Transfers: Sit to/from Stand Sit to Stand: Mod assist, Min assist, From elevated surface           General transfer comment: Mod A for intial momentum for sit to stand from EOB and MIn A for second attempt with heavy cues for correct hand placement and rocking for momentum.    Ambulation/Gait Ambulation/Gait assistance: Min assist Gait Distance (Feet): 4 Feet Assistive device: Rolling walker (2 wheels) Gait Pattern/deviations: Step-through pattern, Decreased step length - right, Decreased step length - left, Wide base of support, Trunk flexed Gait velocity: very slow Gait velocity interpretation: <1.31 ft/sec, indicative of household ambulator   General Gait Details: Pt has partial step through gait pattern with low foot clearance and flat foot intial contact with flexed posture and constant cues for body proximity to AD.     Balance Overall balance assessment: Needs assistance Sitting-balance support: No upper extremity supported, Feet supported Sitting balance-Leahy Scale: Fair     Standing balance support: Bilateral upper extremity supported, During functional activity, Reliant on assistive device for balance Standing balance-Leahy Scale: Poor Standing balance comment: Reliant on BUE suport         Pertinent Vitals/Pain Pain Assessment Pain Assessment: No/denies pain Pain Intervention(s): Monitored during session    Home Living Family/patient expects to be discharged to:: Private residence Living Arrangements: Children Available Help at Discharge: Family;Available 24 hours/day Type of Home: House Home Access: Stairs to enter Entrance Stairs-Rails: None Entrance Stairs-Number of Steps: 3   Home Layout: One level Home Equipment: Rollator (4 wheels);Other (comment);BSC/3in1;Shower seat;Adaptive equipment  Prior Function Prior Level of Function : Independent/Modified Independent;History of Falls (last  six months)             Mobility Comments: Use of rollator mostly, ADLs Comments: Use of reacher for LB ADLs, son manages meds and finances     Extremity/Trunk Assessment   Upper Extremity Assessment Upper Extremity Assessment: Generalized weakness;Defer to OT evaluation    Lower Extremity Assessment Lower Extremity Assessment: Generalized weakness    Cervical / Trunk Assessment Cervical / Trunk Assessment: Normal  Communication   Communication Communication: Impaired Factors Affecting Communication: Reduced clarity of speech    Cognition Arousal: Alert Behavior During Therapy: WFL for tasks assessed/performed     Following commands: Intact       Cueing Cueing Techniques: Verbal cues, Tactile cues     General Comments General comments (skin integrity, edema, etc.): Pt denies dizziness in standing.        Assessment/Plan    PT Assessment Patient needs continued PT services  PT Problem List Decreased strength;Decreased activity tolerance;Decreased balance;Decreased mobility;Decreased safety awareness       PT Treatment Interventions DME instruction;Balance training;Gait training;Stair training;Functional mobility training;Therapeutic activities;Therapeutic exercise;Patient/family education    PT Goals (Current goals can be found in the Care Plan section)  Acute Rehab PT Goals Patient Stated Goal: to move better and talk to his son about rehab PT Goal Formulation: With patient Time For Goal Achievement: 08/06/23 Potential to Achieve Goals: Fair    Frequency Min 1X/week        AM-PAC PT 6 Clicks Mobility  Outcome Measure Help needed turning from your back to your side while in a flat bed without using bedrails?: A Little Help needed moving from lying on your back to sitting on the side of a flat bed without using bedrails?: A Little Help needed moving to and from a bed to a chair (including a wheelchair)?: A Little Help needed standing up from a  chair using your arms (e.g., wheelchair or bedside chair)?: A Lot Help needed to walk in hospital room?: Total Help needed climbing 3-5 steps with a railing? : Total 6 Click Score: 13    End of Session Equipment Utilized During Treatment: Gait belt Activity Tolerance: Patient tolerated treatment well;Patient limited by fatigue Patient left: in bed;with call bell/phone within reach;with bed alarm set Nurse Communication: Mobility status PT Visit Diagnosis: Unsteadiness on feet (R26.81);Other abnormalities of gait and mobility (R26.89)    Time: 8859-8792 PT Time Calculation (min) (ACUTE ONLY): 27 min   Charges:   PT Evaluation $PT Eval Low Complexity: 1 Low PT Treatments $Therapeutic Activity: 8-22 mins PT General Charges $$ ACUTE PT VISIT: 1 Visit         Dorothyann Maier, DPT, CLT  Acute Rehabilitation Services Office: [phone] (Secure chat preferred)   Dorothyann VEAR Maier 07/23/2023, 12:20 PM

## 2023-07-23 NOTE — Progress Notes (Addendum)
 Progress Note    Edgar BLUEMEL Sr.  FMW:[id number] DOB: 1935-08-07  DOA: 07/21/2023 PCP: Darra Hamilton, PA-C      Brief Narrative:    Medical records reviewed and are as summarized below:  Edgar [name] is a 88 y.o. 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  Patient said he tripped over his Foley catheter and fell in the bathroom.  Reportedly, he had initially refused to go to the hospital but on reflection, he noted that he was too weak to move or get up so he decided to go to the ED for further management.  He said his Foley catheter was changed in July 14, 2023.  He was found to have abnormal urinalysis (cloudy urine with large leukocytes, 11-20 WBCs per hpf, 11-20 RBCs per hpf)    Assessment/Plan:   Principal Problem:   Fall at home, initial encounter Active Problems:   Acute UTI    Body mass index is 34.67 kg/m.  (Obesity)   S/p mechanical fall at home, general weakness: Orthostatic vital signs was negative.  PT  recommended discharge to SNF Neck and jaw pain: Improved. No acute fracture or listhesis of the visualized cervical spine and cervical spine x-ray .  Abdominal pain, acute complicated UTI with chronic indwelling Foley catheter in place: Abdominal pain has improved.  Urine culture showed Proteus mirabilis and Providencia stuartii.  Continue IV ceftriaxone .  Follow-up urine culture sensitivity report.   Hypomagnesemia: Improved.   Sinus bradycardia, first-degree AV block: Asymptomatic.  Heart rate has been in the 50s to 70s..  Monitor on telemetry.    Chronic urinary retention, BPH: Foley catheter was changed on 07/22/2023   History of TIA on aspirin    No acute abnormality on CT angio chest abdomen and  pelvis:  IMPRESSION: 1. No evidence of acute aortic syndrome. 2. Moderate left anterior descending coronary artery calcification. 3. No pulmonary embolism identified. 4. Small hiatal hernia. 5. Cholelithiasis. 6. Mild ascending colonic diverticulosis. 7. Marked prostatic hypertrophy with bladder wall thickening in keeping with changes of chronic bladder outlet obstruction. Foley catheter balloon is seen within a decompressed bladder lumen. 8. Small fat containing left inguinal hernia.    Diet Order             Diet Heart Room service appropriate? Yes with Assist; Fluid consistency: Thin  Diet effective now                            Consultants: None  Procedures: None    Medications:    aspirin   81 mg Oral Daily   Chlorhexidine  Gluconate Cloth  6 each Topical Daily   cyanocobalamin   1,000 mcg Oral Daily   finasteride   5 mg Oral Daily   gabapentin   100 mg Oral TID   heparin   5,000 Units Subcutaneous Q8H   pravastatin   20 mg Oral q1800   sertraline   25 mg Oral QHS   sodium chloride  flush  3 mL Intravenous Q12H   tamsulosin   0.4 mg Oral Daily   thiamine   100 mg Oral Daily   Continuous Infusions:  sodium chloride  50 mL/hr at 07/23/23 0508   cefTRIAXone  (ROCEPHIN )  IV 2 g (07/23/23 0952)     Anti-infectives (From  admission, onward)    Start     Dose/Rate Route Frequency Ordered Stop   07/22/23 1000  cefTRIAXone  (ROCEPHIN ) 2 g in sodium chloride  0.9 % 100 mL IVPB        2 g 200 mL/hr over 30 Minutes Intravenous Every 24 hours 07/21/23 1855     07/21/23 1630  cefTRIAXone  (ROCEPHIN ) 1 g in sodium chloride  0.9 % 100 mL IVPB        1 g 200 mL/hr over 30 Minutes Intravenous  Once 07/21/23 1626 07/21/23 1824              Family Communication/Anticipated D/C date and plan/Code Status   DVT prophylaxis: heparin  injection 5,000 Units Start: 07/21/23 2200     Code Status: Full Code  Family Communication: None Disposition Plan: Plan to  discharge home   Status is: Inpatient Remains inpatient appropriate because: General weakness, acute UTI       Subjective:   Interval events noted.  He complains of general weakness.  Otherwise, he feels better.    Objective:    Vitals:   07/23/23 0334 07/23/23 0500 07/23/23 0732 07/23/23 1108  BP: 121/60  138/64 (!) 155/69  Pulse: (!) 54  (!) 58 (!) 53  Resp: 18  16 18   Temp: (!) 97 F (36.1 C)  98 F (36.7 C) 97.9 F (36.6 C)  TempSrc: Oral  Oral Oral  SpO2: 98%  98% 99%  Weight:  106.5 kg    Height:       No data found.   Intake/Output Summary (Last 24 hours) at 07/23/2023 1541 Last data filed at 07/23/2023 0516 Gross per 24 hour  Intake 9.7 ml  Output 1500 ml  Net -1490.3 ml   Filed Weights   07/22/23 0253 07/23/23 0500  Weight: 105 kg 106.5 kg    Exam:   GEN: NAD SKIN: Warm and dry EYES: Anicteric ENT: MMM CV: RRR PULM: CTA B ABD: soft, ND, NT, +BS CNS: AAO x 3, non focal EXT: No edema or tenderness No CVA tenderness.  Foley catheter draining amber urine.       Data Reviewed:   I have personally reviewed following labs and imaging studies:  Labs: Labs show the following:   Basic Metabolic Panel: Recent Labs  Lab 07/21/23 1310 07/21/23 1957 07/22/23 0338 07/22/23 1313 07/23/23 0358  NA 141  --  138 141  --   K 4.0  --  3.7 3.9  --   CL 108  --  110 105  --   CO2 24  --  23 23  --   GLUCOSE 78  --  113* 134*  --   BUN 10  --  10 12  --   CREATININE 0.88 0.83 0.80 0.80  --   CALCIUM  9.4  --  8.6* 9.2  --   MG  --   --  1.6*  --  2.0  PHOS  --   --  3.1  --   --    GFR Estimated Creatinine Clearance: 79.7 mL/min (by C-G formula based on SCr of 0.8 mg/dL). Liver Function Tests: Recent Labs  Lab 07/21/23 1310 07/22/23 0338  AST 25 23  ALT 25 22  ALKPHOS 41 36*  BILITOT 0.5 0.6  PROT 6.6 6.2*  ALBUMIN 3.5 3.1*   No results for input(s): LIPASE, AMYLASE in the last 168 hours. No results for input(s): AMMONIA in  the last 168 hours. Coagulation profile No results for input(s): INR, PROTIME  in the last 168 hours.  CBC: Recent Labs  Lab 07/21/23 1310 07/21/23 1957 07/22/23 0338  WBC 8.7 7.9 7.2  NEUTROABS 5.2  --   --   HGB 13.1 12.9* 12.5*  HCT 41.2 41.3 39.0  MCV 86.9 86.9 86.5  PLT 167 168 156   Cardiac Enzymes: Recent Labs  Lab 07/21/23 1310  CKTOTAL 197   BNP (last 3 results) No results for input(s): PROBNP in the last 8760 hours. CBG: Recent Labs  Lab 07/21/23 2145 07/22/23 2105 07/23/23 0653 07/23/23 1110  GLUCAP 105* 145* 133* 107*   D-Dimer: No results for input(s): DDIMER in the last 72 hours. Hgb A1c: No results for input(s): HGBA1C in the last 72 hours. Lipid Profile: No results for input(s): CHOL, HDL, LDLCALC, TRIG, CHOLHDL, LDLDIRECT in the last 72 hours. Thyroid function studies: Recent Labs    07/21/23 1957  TSH 2.529   Anemia work up: No results for input(s): VITAMINB12, FOLATE, FERRITIN, TIBC, IRON , RETICCTPCT in the last 72 hours. Sepsis Labs: Recent Labs  Lab 07/21/23 1310 07/21/23 1957 07/22/23 0338  WBC 8.7 7.9 7.2    Microbiology Recent Results (from the past 240 hours)  Urine Culture     Status: Abnormal (Preliminary result)   Collection Time: 07/21/23  2:42 PM   Specimen: Urine, Random  Result Value Ref Range Status   Specimen Description URINE, RANDOM  Final   Special Requests NONE Reflexed from [id number]  Final   Culture (A)  Final    >=100,000 COLONIES/mL PROTEUS MIRABILIS 80,000 COLONIES/mL PROVIDENCIA STUARTII SUSCEPTIBILITIES TO FOLLOW Performed at [facility], [address], [place], [place] [address]    Report Status PENDING  Incomplete  Resp Panel by RT-PCR (Flu A&B, Covid) Anterior Nasal Swab     Status: None   Collection Time: 07/21/23  5:12 PM   Specimen: Anterior Nasal Swab  Result Value Ref Range Status   SARS Coronavirus 2 by RT PCR NEGATIVE NEGATIVE Final   Influenza A by  PCR NEGATIVE NEGATIVE Final   Influenza B by PCR NEGATIVE NEGATIVE Final    Comment: (NOTE) The Xpert Xpress SARS-CoV-2/FLU/RSV plus assay is intended as an aid in the diagnosis of influenza from Nasopharyngeal swab specimens and should not be used as a sole basis for treatment. Nasal washings and aspirates are unacceptable for Xpert Xpress SARS-CoV-2/FLU/RSV testing.  Fact Sheet for Patients: BloggerCourse.com  Fact Sheet for Healthcare Providers: SeriousBroker.it  This test is not yet approved or cleared by the [place]  FDA and has been authorized for detection and/or diagnosis of SARS-CoV-2 by FDA under an Emergency Use Authorization (EUA). This EUA will remain in effect (meaning this test can be used) for the duration of the COVID-19 declaration under Section 564(b)(1) of the Act, 21 U.S.C. section 360bbb-3(b)(1), unless the authorization is terminated or revoked.  Performed at [facility], [address] [place], [place] [address]     Procedures and diagnostic studies:  CT Angio Chest/Abd/Pel for Dissection W and/or W/WO Result Date: 07/21/2023 CLINICAL DATA:  Thoracoabdominal aortic aneurysm, pulmonary embolism, upper chest pain, unspecified abdominal pain. EXAM: CT ANGIOGRAPHY CHEST, ABDOMEN AND PELVIS TECHNIQUE: Non-contrast CT of the chest was initially obtained. Multidetector CT imaging through the chest, abdomen and pelvis was performed using the standard protocol during bolus administration of intravenous contrast. Multiplanar reconstructed images and MIPs were obtained and reviewed to evaluate the vascular anatomy. RADIATION DOSE REDUCTION: This exam was performed according to the departmental dose-optimization program which includes  automated exposure control, adjustment of the mA and/or kV according to patient size and/or use of iterative reconstruction technique. CONTRAST:  OMNIPAQUE  IOHEXOL  350 MG/ML  SOLN COMPARISON:  CT abdomen pelvis 11/30/2020 FINDINGS: CTA CHEST FINDINGS Cardiovascular: Moderate left anterior descending coronary artery calcification. Global cardiac size within normal limits. No pericardial effusion. Adequate opacification of the pulmonary arterial tree. No intraluminal filling defect identified to suggest acute or chronic pulmonary embolus. Central pulmonary arteries are of normal caliber. Thoracic aorta is normal in course and caliber. No intramural hematoma, dissection, or aneurysm. Mild atherosclerotic calcification within the thoracic aorta. Mediastinum/Nodes: No enlarged mediastinal, hilar, or axillary lymph nodes. Thyroid gland, trachea, and esophagus demonstrate no significant findings. Small hiatal hernia Lungs/Pleura: Respiratory motion artifact. No confluent pulmonary infiltrate. No pneumothorax or pleural effusion. No central obstructing lesion. Musculoskeletal: No chest wall abnormality. No acute or significant osseous findings. Review of the MIP images confirms the above findings. CTA ABDOMEN AND PELVIS FINDINGS VASCULAR Aorta: The abdominal aorta is normal in course and caliber. No intramural hematoma, dissection, or aneurysm. Mild atherosclerotic calcification Celiac: Patent without evidence of aneurysm, dissection, vasculitis or significant stenosis. SMA: Patent without evidence of aneurysm, dissection, vasculitis or significant stenosis. Renals: Both renal arteries are patent without evidence of aneurysm, dissection, vasculitis, fibromuscular dysplasia or significant stenosis. IMA: Accessory left lower pole renal artery. Inflow: Patent without evidence of aneurysm, dissection, vasculitis or significant stenosis. Veins: No obvious venous abnormality within the limitations of this arterial phase study. Review of the MIP images confirms the above findings. NON-VASCULAR Hepatobiliary: Cholelithiasis without superimposed pericholecystic inflammatory change. Liver unremarkable on  this arterial phase examination. No intra or extrahepatic biliary ductal dilation. Pancreas: Unremarkable Spleen: Unremarkable Adrenals/Urinary Tract: The adrenal glands are unremarkable. The kidneys are normal. The bladder is thick walled, suggesting changes chronic bladder outlet obstruction. Foley catheter balloon is seen within a decompressed bladder lumen Stomach/Bowel: Moderate colonic stool burden. Mild ascending colonic diverticulosis. Stomach, small bowel, and large bowel are otherwise unremarkable. No evidence of obstruction or focal inflammation. Appendix is normal. No free intraperitoneal gas or fluid. Lymphatic: No pathologic adenopathy within the abdomen and pelvis. Reproductive: Marked prostatic hypertrophy Other: Small fat containing left inguinal hernia. Musculoskeletal: No acute bone abnormality. No lytic or blastic bone lesion. Osseous structures are age appropriate. Review of the MIP images confirms the above findings. IMPRESSION: 1. No evidence of acute aortic syndrome. 2. Moderate left anterior descending coronary artery calcification. 3. No pulmonary embolism identified. 4. Small hiatal hernia. 5. Cholelithiasis. 6. Mild ascending colonic diverticulosis. 7. Marked prostatic hypertrophy with bladder wall thickening in keeping with changes of chronic bladder outlet obstruction. Foley catheter balloon is seen within a decompressed bladder lumen. 8. Small fat containing left inguinal hernia. Aortic Atherosclerosis (ICD10-I70.0). Electronically Signed   By: Dorethia Molt M.D.   On: 07/21/2023 21:19   DG Cervical Spine 2 or 3 views Result Date: 07/21/2023 CLINICAL DATA:  Fall, neck pain EXAM: CERVICAL SPINE - 2-3 VIEW COMPARISON:  None Available. FINDINGS: Cervical spine is visualized from the occiput through C5-6 on lateral examination. C6-T1 is obscured by overlying soft tissues. Mild straightening the visualized cervical spine. The examination is slightly limited by obliquity, however, no  acute fracture or listhesis of the visualized cervical spine. Vertebral body heights are preserved. Mild disc space narrowing and endplate remodeling at C4-5 and C5-6 is present in keeping with changes of mild to moderate degenerative disc disease. Multilevel facet arthrosis is not well assessed on this examination. Prevertebral soft tissues  are not thickened. IMPRESSION: 1. No acute fracture or listhesis of the visualized cervical spine. C6-T1 not visualized. Electronically Signed   By: Dorethia Molt M.D.   On: 07/21/2023 19:40               LOS: 2 days   Kaitlynne Wenz  [facility]   Pager on [email or web address]. If 7PM-7AM, please contact night-coverage at [email or web address]     07/23/2023, 3:41 PM

## 2023-07-24 DIAGNOSIS — W19XXXA Unspecified fall, initial encounter: Secondary | ICD-10-CM | POA: Diagnosis not present

## 2023-07-24 DIAGNOSIS — Y92009 Unspecified place in unspecified non-institutional (private) residence as the place of occurrence of the external cause: Secondary | ICD-10-CM | POA: Diagnosis not present

## 2023-07-24 DIAGNOSIS — N390 Urinary tract infection, site not specified: Secondary | ICD-10-CM | POA: Diagnosis not present

## 2023-07-24 LAB — HEMOGLOBIN A1C
Hgb A1c MFr Bld: 6.8 % — ABNORMAL HIGH (ref 4.8–5.6)
Mean Plasma Glucose: 148 mg/dL

## 2023-07-24 LAB — GLUCOSE, CAPILLARY
Glucose-Capillary: 135 mg/dL — ABNORMAL HIGH (ref 70–99)
Glucose-Capillary: 123 mg/dL — ABNORMAL HIGH (ref 70–99)
Glucose-Capillary: 126 mg/dL — ABNORMAL HIGH (ref 70–99)
Glucose-Capillary: 147 mg/dL — ABNORMAL HIGH (ref 70–99)

## 2023-07-24 LAB — URINE CULTURE: Culture: >=100000 — AB

## 2023-07-24 NOTE — Care Management Important Message (Signed)
 Important Message  Patient Details  Name: Edgar HEADEN Sr. MRN: [id number] Date of Birth: 1935-08-13   Important Message Given:  Yes - Medicare IM     Claretta Deed 07/24/2023, 3:45 PM

## 2023-07-24 NOTE — TOC Initial Note (Signed)
 Transition of Care (TOC) - Initial/Assessment Note    Patient Details  Name: Edgar RECENDEZ Sr. MRN: [id number] Date of Birth: 1935-10-09  Transition [facility]) CM/SW Contact:    Edgar LOISE Louder, LCSW Phone Number: 07/24/2023, 5:25 PM  Clinical Narrative:                  CSWW met with patient. CSW introduced self and explained role. CSW explained therapy recommendation for short term rehab at [facility] Patient states he is agrees with recommendation but requested CSW contact his son, Edgar Mckay to discuss.   CSW spoke with patient's son Sho- he too agrees with recommendation and wants to pursue SNF. No preferred SNF at this time. CSW explained the SNF process. CSW will provide bed offers once available.   TOC will continue to follow and assist with discharge planning.  Edgar Mckay, MSW, LCSW Clinical Social Worker    Expected Discharge Plan: Skilled Nursing Facility Barriers to Discharge: Continued Medical Work up   Patient Goals and CMS Choice            Expected Discharge Plan and Services In-house Referral: Clinical Social Work                                            Prior Living Arrangements/Services   Lives with:: Self, Adult Children Patient language and need for interpreter reviewed:: No        Need for Family Participation in Patient Care: Yes (Comment) Care giver support system in place?: Yes (comment)   Criminal Activity/Legal Involvement Pertinent to Current Situation/Hospitalization: No - Comment as needed  Activities of Edgar Mckay Living   ADL Screening (condition at time of admission) Independently performs ADLs?: No Does the patient have a NEW difficulty with bathing/dressing/toileting/self-feeding that is expected to last >3 days?: Yes (Initiates electronic notice to provider for possible OT consult) Does the patient have a NEW difficulty with getting in/out of bed, walking, or climbing stairs that is expected to last >3 days?: Yes  (Initiates electronic notice to provider for possible PT consult) Does the patient have a NEW difficulty with communication that is expected to last >3 days?: Yes (Initiates electronic notice to provider for possible SLP consult) Is the patient deaf or have difficulty hearing?: Yes Does the patient have difficulty seeing, even when wearing glasses/contacts?: Yes Does the patient have difficulty concentrating, remembering, or making decisions?: Yes  Permission Sought/Granted Permission sought to share information with : Facility Medical sales representative, Family Supports Permission granted to share information with : Yes, Verbal Permission Granted  Share Information with NAME: Edgar Mckay  Permission granted to share info w AGENCY: SNF  Permission granted to share info w Relationship: son  Permission granted to share info w Contact Information: [phone]  Emotional Assessment Appearance:: Appears stated age     Orientation: : Oriented to Self, Oriented to Place, Oriented to  Time, Oriented to Situation Alcohol / Substance Use: Not Applicable Psych Involvement: No (comment)  Admission diagnosis:  Lower urinary tract infectious disease [N39.0] Acute cystitis without hematuria [N30.00] Generalized weakness [R53.1] Fall at home, initial encounter [W19.CHERENE, Y92.009] Patient Active Problem List   Diagnosis Date Noted   Acute UTI 07/22/2023   Fall at home, initial encounter 07/21/2023   TIA (transient ischemic attack) 12/07/2020   Urinary retention 12/05/2020   Muscle weakness (generalized) 12/05/2020   Unsteadiness on  feet 12/05/2020   Frailty 12/05/2020   Acute cystitis with hematuria    Poor social situation    Obstruction of Foley catheter (HCC) 11/30/2020   PCP:  Darra Hamilton, PA-C Pharmacy:   [facility] - [facility] [address] [place] [address] Phone: [phone] Fax: [phone]  [id number] [place], [place] - [address] AT [facility] [address] [facility] [address] & [address] [place] [address] Phone: [phone] Fax: [phone]  [facility] [address] [place] [address] Phone: [phone] Fax: [phone]     Social Drivers of Health (SDOH) Social History: SDOH Screenings   Food Insecurity: No Food Insecurity (07/22/2023)  Housing: Low Risk  (07/22/2023)  Transportation Needs: No Transportation Needs (07/22/2023)  Utilities: Not At Risk (07/22/2023)  Depression (PHQ2-9): Low Risk  (03/25/2021)  Social Connections: Socially Isolated (07/22/2023)  Tobacco Use: High Risk (07/21/2023)   SDOH Interventions:     Readmission Risk Interventions     No data to display

## 2023-07-24 NOTE — Progress Notes (Signed)
 PROGRESS NOTE    Edgar Mckay.  FMW:[id number]  DOB: 1935-07-16  DOA: 07/21/2023 PCP: Edgar Hamilton, PA-C Outpatient Specialists:   Hospital course:  88 year old man with chronic indwelling Foley secondary to urinary retention and history of falls presented with recurrent fall and generalized weakness.  Workup in ED was notable for abnormal UA and fever of 99.1 although patient had no leukocytosis.  Subjective:  Patient has no complaints notes with this thing, it is always 1 thing or the other.  Notes he lives with his son and at baseline can go out 3-4 times a day.   Objective: Vitals:   07/24/23 0456 07/24/23 0808 07/24/23 1136 07/24/23 1614  BP: (!) 128/56 108/66 (!) 147/68 138/68  Pulse: (!) 59 (!) 40 68 (!) 57  Resp: 17 20 (!) 21 16  Temp: 98.5 F (36.9 C) 98 F (36.7 C) 98 F (36.7 C) 98.1 F (36.7 C)  TempSrc: Oral Oral Oral Oral  SpO2: 97% 100% 94% 95%  Weight:      Height:        Intake/Output Summary (Last 24 hours) at 07/24/2023 1749 Last data filed at 07/24/2023 1410 Gross per 24 hour  Intake 3 ml  Output 1300 ml  Net -1297 ml   Filed Weights   07/22/23 0253 07/23/23 0500  Weight: 105 kg 106.5 kg     Exam:  General: Elderly gentleman lying in bed in NAD Eyes: sclera anicteric, conjuctiva mild injection bilaterally CVS: S1-S2, regular  Respiratory:  decreased air entry bilaterally secondary to decreased inspiratory effort, rales at bases  GI: NABS, soft, NT  LE: Warm and well-perfused  Data Reviewed:  Basic Metabolic Panel: Recent Labs  Lab 07/21/23 1310 07/21/23 1957 07/22/23 0338 07/22/23 1313 07/23/23 0358  NA 141  --  138 141  --   K 4.0  --  3.7 3.9  --   CL 108  --  110 105  --   CO2 24  --  23 23  --   GLUCOSE 78  --  113* 134*  --   BUN 10  --  10 12  --   CREATININE 0.88 0.83 0.80 0.80  --   CALCIUM  9.4  --  8.6* 9.2  --   MG  --   --  1.6*  --  2.0  PHOS  --   --  3.1  --   --     CBC: Recent Labs  Lab  07/21/23 1310 07/21/23 1957 07/22/23 0338  WBC 8.7 7.9 7.2  NEUTROABS 5.2  --   --   HGB 13.1 12.9* 12.5*  HCT 41.2 41.3 39.0  MCV 86.9 86.9 86.5  PLT 167 168 156     Scheduled Meds:  aspirin   81 mg Oral Daily   Chlorhexidine  Gluconate Cloth  6 each Topical Daily   cyanocobalamin   1,000 mcg Oral Daily   finasteride   5 mg Oral Daily   gabapentin   100 mg Oral TID   heparin   5,000 Units Subcutaneous Q8H   pravastatin   20 mg Oral q1800   sertraline   25 mg Oral QHS   sodium chloride  flush  3 mL Intravenous Q12H   tamsulosin   0.4 mg Oral Daily   thiamine   100 mg Oral Daily   Continuous Infusions:  cefTRIAXone  (ROCEPHIN )  IV 2 g (07/24/23 1040)     Assessment & Plan:   Complicated UTI Chronic indwelling Foley Abdominal pain--resolved Urine culture is growing out Proteus mirabilis and  Providencia stuartii I Unfortunately blood cultures were never sent Today is day #3 of ceftriaxone  Given Tmax of 99.1 today, will continue IV ceftriaxone  for 1 more day and then switch to oral Bactrim  if patient remains afebrile. Continue tamsulosin  and finasteride  Patient will need to have his Foley changed prior to discharge  Anxiety and depression Continue sertraline   History of TIA Continue aspirin  and pravastatin   Disposition PT recommending SNF placement  DVT prophylaxis: SCD Code Status: Full Family Communication: None today     Studies: No results found.  Principal Problem:   Fall at home, initial encounter Active Problems:   Acute UTI     Edgar Mckay, [facility]  If 7PM-7AM, please contact night-coverage [email or web address]   LOS: 3 days

## 2023-07-25 DIAGNOSIS — W19XXXA Unspecified fall, initial encounter: Secondary | ICD-10-CM | POA: Diagnosis not present

## 2023-07-25 DIAGNOSIS — N390 Urinary tract infection, site not specified: Secondary | ICD-10-CM | POA: Diagnosis not present

## 2023-07-25 DIAGNOSIS — Y92009 Unspecified place in unspecified non-institutional (private) residence as the place of occurrence of the external cause: Secondary | ICD-10-CM | POA: Diagnosis not present

## 2023-07-25 LAB — GLUCOSE, CAPILLARY
Glucose-Capillary: 111 mg/dL — ABNORMAL HIGH (ref 70–99)
Glucose-Capillary: 141 mg/dL — ABNORMAL HIGH (ref 70–99)
Glucose-Capillary: 207 mg/dL — ABNORMAL HIGH (ref 70–99)

## 2023-07-25 MED ORDER — SULFAMETHOXAZOLE-TRIMETHOPRIM 800-160 MG PO TABS
1.0000 | ORAL_TABLET | Freq: Two times a day (BID) | ORAL | Status: DC
  Administered 2023-07-26 – 2023-07-27 (×3): 1 via ORAL
  Filled 2023-07-25 (×4): qty 1

## 2023-07-25 NOTE — TOC Progression Note (Signed)
 Transition of Care (TOC) - Progression Note    Patient Details  Name: Edgar SOTER Sr. MRN: [id number] Date of Birth: 20-Aug-1935  Transition [facility]) CM/SW Contact  Montie LOISE Louder, [place] Phone Number: 07/25/2023, 3:19 PM  Clinical Narrative:     Called patient 's son, Edgar Mckay- informed of bed offers. He chose [place]  CSW sent message to [place]- waiting on response   Montie Louder, MSW, LCSW Clinical Social Worker    Expected Discharge Plan: Skilled Nursing Facility Barriers to Discharge: Continued Medical Work up  Expected Discharge Plan and Services In-house Referral: Clinical Social Work                                             Social Determinants of Health (SDOH) Interventions SDOH Screenings   Food Insecurity: No Food Insecurity (07/22/2023)  Housing: Low Risk  (07/22/2023)  Transportation Needs: No Transportation Needs (07/22/2023)  Utilities: Not At Risk (07/22/2023)  Depression (PHQ2-9): Low Risk  (03/25/2021)  Social Connections: Socially Isolated (07/22/2023)  Tobacco Use: High Risk (07/21/2023)    Readmission Risk Interventions     No data to display

## 2023-07-25 NOTE — Progress Notes (Signed)
 PROGRESS NOTE    Edgar SEBO Sr.  FMW:[id number] DOB: Sep 25, 1935 DOA: 07/21/2023 PCP: Darra Hamilton, PA-C    Brief Narrative:  88 year old man with chronic indwelling Foley secondary to urinary retention and history of falls presented with recurrent fall and generalized weakness. Workup in ED was notable for abnormal UA and fever of 99.1 although patient had no leukocytosis. He was treated as UTI . Now waiting for SNF  Subjective: Patient seen and examined.  No overnight events.  He just looks frustrated.  Apparently, patient admitted with UTI associated with Foley catheter and catheter has not been changed yet.  Cultures are negative. Assessment & Plan:   Complicated UTI secondary to chronic indwelling Foley catheter: Urine culture with Proteus and procidentia, blood cultures were not collected. Patient is on day 4 of ceftriaxone  today.  Clinically improving.  Changing to Bactrim  to complete total 7 days of therapy. Continue Flomax  and finasteride . Change Foley catheter today.  Anxiety depression: On sertraline .  Continue.  History of TIA: On aspirin  and statin.  Continue.   DVT prophylaxis: heparin  injection 5,000 Units Start: 07/21/23 2200   Code Status: Full code Family Communication: None at the bedside Disposition Plan: Status is: Inpatient Remains inpatient appropriate because: Medically stable.  Needs SNF bed.     Consultants:  None  Procedures:  None  Antimicrobials:  Rocephin  7/12--7/15 Bactrim  7/16---     Objective: Vitals:   07/24/23 2336 07/25/23 0347 07/25/23 0747 07/25/23 1156  BP: (!) 145/63 (!) 141/67 133/84 127/62  Pulse: 65 66 62 64  Resp: 18 14 18 16   Temp: 98.1 F (36.7 C) 98.9 F (37.2 C) 97.7 F (36.5 C) 98.3 F (36.8 C)  TempSrc: Oral Oral Oral Oral  SpO2: 95% 95% 93% 98%  Weight:      Height:        Intake/Output Summary (Last 24 hours) at 07/25/2023 1349 Last data filed at 07/25/2023 0855 Gross per 24 hour  Intake --   Output 1600 ml  Net -1600 ml   Filed Weights   07/22/23 0253 07/23/23 0500  Weight: 105 kg 106.5 kg    Examination:  General exam: Appears calm , slightly anxious. Respiratory system: Clear to auscultation. Respiratory effort normal. Cardiovascular system: S1 & S2 heard, RRR.  Gastrointestinal system: Soft and nontender.  Foley catheter with clear urine. Central nervous system: Alert and oriented. No focal neurological deficits.  Gross generalized weakness.    Data Reviewed: I have personally reviewed following labs and imaging studies  CBC: Recent Labs  Lab 07/21/23 1310 07/21/23 1957 07/22/23 0338  WBC 8.7 7.9 7.2  NEUTROABS 5.2  --   --   HGB 13.1 12.9* 12.5*  HCT 41.2 41.3 39.0  MCV 86.9 86.9 86.5  PLT 167 168 156   Basic Metabolic Panel: Recent Labs  Lab 07/21/23 1310 07/21/23 1957 07/22/23 0338 07/22/23 1313 07/23/23 0358  NA 141  --  138 141  --   K 4.0  --  3.7 3.9  --   CL 108  --  110 105  --   CO2 24  --  23 23  --   GLUCOSE 78  --  113* 134*  --   BUN 10  --  10 12  --   CREATININE 0.88 0.83 0.80 0.80  --   CALCIUM  9.4  --  8.6* 9.2  --   MG  --   --  1.6*  --  2.0  PHOS  --   --  3.1  --   --    GFR: Estimated Creatinine Clearance: 79.7 mL/min (by C-G formula based on SCr of 0.8 mg/dL). Liver Function Tests: Recent Labs  Lab 07/21/23 1310 07/22/23 0338  AST 25 23  ALT 25 22  ALKPHOS 41 36*  BILITOT 0.5 0.6  PROT 6.6 6.2*  ALBUMIN 3.5 3.1*   No results for input(s): LIPASE, AMYLASE in the last 168 hours. No results for input(s): AMMONIA in the last 168 hours. Coagulation Profile: No results for input(s): INR, PROTIME in the last 168 hours. Cardiac Enzymes: Recent Labs  Lab 07/21/23 1310  CKTOTAL 197   BNP (last 3 results) No results for input(s): PROBNP in the last 8760 hours. HbA1C: No results for input(s): HGBA1C in the last 72 hours. CBG: Recent Labs  Lab 07/24/23 1143 07/24/23 1612 07/24/23 2109  07/25/23 0611 07/25/23 1206  GLUCAP 123* 126* 147* 111* 141*   Lipid Profile: No results for input(s): CHOL, HDL, LDLCALC, TRIG, CHOLHDL, LDLDIRECT in the last 72 hours. Thyroid Function Tests: No results for input(s): TSH, T4TOTAL, FREET4, T3FREE, THYROIDAB in the last 72 hours. Anemia Panel: No results for input(s): VITAMINB12, FOLATE, FERRITIN, TIBC, IRON , RETICCTPCT in the last 72 hours. Sepsis Labs: No results for input(s): PROCALCITON, LATICACIDVEN in the last 168 hours.  Recent Results (from the past 240 hours)  Urine Culture     Status: Abnormal   Collection Time: 07/21/23  2:42 PM   Specimen: Urine, Random  Result Value Ref Range Status   Specimen Description URINE, RANDOM  Final   Special Requests   Final    NONE Reflexed from [id number] Performed at [facility], [address], [place], [place] [address]    Culture (A)  Final    >=100,000 COLONIES/mL PROTEUS MIRABILIS 80,000 COLONIES/mL PROVIDENCIA STUARTII    Report Status 07/24/2023 FINAL  Final   Organism ID, Bacteria PROTEUS MIRABILIS (A)  Final   Organism ID, Bacteria PROVIDENCIA STUARTII (A)  Final      Susceptibility   Proteus mirabilis - MIC*    AMPICILLIN <=2 SENSITIVE Sensitive     CEFAZOLIN <=4 SENSITIVE Sensitive     CEFEPIME <=0.12 SENSITIVE Sensitive     CEFTRIAXONE  <=0.25 SENSITIVE Sensitive     CIPROFLOXACIN  0.5 INTERMEDIATE Intermediate     GENTAMICIN <=1 SENSITIVE Sensitive     IMIPENEM 8 INTERMEDIATE Intermediate     NITROFURANTOIN  128 RESISTANT Resistant     TRIMETH /SULFA  <=20 SENSITIVE Sensitive     AMPICILLIN/SULBACTAM <=2 SENSITIVE Sensitive     PIP/TAZO <=4 SENSITIVE Sensitive ug/mL    * >=100,000 COLONIES/mL PROTEUS MIRABILIS   Providencia stuartii - MIC*    AMPICILLIN RESISTANT Resistant     CEFEPIME <=0.12 SENSITIVE Sensitive     CEFTRIAXONE  <=0.25 SENSITIVE Sensitive     CIPROFLOXACIN  <=0.25 SENSITIVE Sensitive     GENTAMICIN RESISTANT  Resistant     IMIPENEM 1 SENSITIVE Sensitive     NITROFURANTOIN  256 RESISTANT Resistant     TRIMETH /SULFA  <=20 SENSITIVE Sensitive     AMPICILLIN/SULBACTAM 16 INTERMEDIATE Intermediate     PIP/TAZO <=4 SENSITIVE Sensitive ug/mL    * 80,000 COLONIES/mL PROVIDENCIA STUARTII  Resp Panel by RT-PCR (Flu A&B, Covid) Anterior Nasal Swab     Status: None   Collection Time: 07/21/23  5:12 PM   Specimen: Anterior Nasal Swab  Result Value Ref Range Status   SARS Coronavirus 2 by RT PCR NEGATIVE NEGATIVE Final   Influenza A by PCR NEGATIVE NEGATIVE Final  Influenza B by PCR NEGATIVE NEGATIVE Final    Comment: (NOTE) The Xpert Xpress SARS-CoV-2/FLU/RSV plus assay is intended as an aid in the diagnosis of influenza from Nasopharyngeal swab specimens and should not be used as a sole basis for treatment. Nasal washings and aspirates are unacceptable for Xpert Xpress SARS-CoV-2/FLU/RSV testing.  Fact Sheet for Patients: BloggerCourse.com  Fact Sheet for Healthcare Providers: SeriousBroker.it  This test is not yet approved or cleared by the [place]  FDA and has been authorized for detection and/or diagnosis of SARS-CoV-2 by FDA under an Emergency Use Authorization (EUA). This EUA will remain in effect (meaning this test can be used) for the duration of the COVID-19 declaration under Section 564(b)(1) of the Act, 21 U.S.C. section 360bbb-3(b)(1), unless the authorization is terminated or revoked.  Performed at [facility], [address], [place], [place] [address]          Radiology Studies: No results found.      Scheduled Meds:  aspirin   81 mg Oral Daily   Chlorhexidine  Gluconate Cloth  6 each Topical Daily   cyanocobalamin   1,000 mcg Oral Daily   finasteride   5 mg Oral Daily   gabapentin   100 mg Oral TID   heparin   5,000 Units Subcutaneous Q8H   pravastatin   20 mg Oral q1800   sertraline   25 mg Oral QHS    sodium chloride  flush  3 mL Intravenous Q12H   [START ON 07/26/2023] sulfamethoxazole -trimethoprim   1 tablet Oral Q12H   tamsulosin   0.4 mg Oral Daily   thiamine   100 mg Oral Daily   Continuous Infusions:   LOS: 4 days    Time spent: 40 minutes    Renato Applebaum, MD [facility]

## 2023-07-25 NOTE — NC FL2 (Signed)
 [place]  MEDICAID FL2 LEVEL OF CARE FORM     IDENTIFICATION  Patient Name: Edgar Mckay. Birthdate: 05-Jul-1935 Sex: male Admission Date (Current Location): 07/21/2023  [facility] and [place] Number:  Producer, television/film/video and Address:  [facility], [address], [place], [place] [address]      Provider Number: [id number]  Attending Physician Name and Address:  Raenelle Coria, MD  Relative Name and Phone Number:       Current Level of Care: Hospital Recommended Level of Care: Skilled Nursing Facility Prior Approval Number:    Date Approved/Denied:   PASRR Number: [id number]  Discharge Plan:      Current Diagnoses: Patient Active Problem List   Diagnosis Date Noted   Acute UTI 07/22/2023   Fall at home, initial encounter 07/21/2023   TIA (transient ischemic attack) 12/07/2020   Urinary retention 12/05/2020   Muscle weakness (generalized) 12/05/2020   Unsteadiness on feet 12/05/2020   Frailty 12/05/2020   Acute cystitis with hematuria    Poor social situation    Obstruction of Foley catheter (HCC) 11/30/2020    Orientation RESPIRATION BLADDER Height & Weight     Self, Time, Situation, Place  Normal External catheter, Continent Weight: 234 lb 12.6 oz (106.5 kg) Height:  5' 9 (175.3 cm)  BEHAVIORAL SYMPTOMS/MOOD NEUROLOGICAL BOWEL NUTRITION STATUS      Continent Diet (please see discharge summary)  AMBULATORY STATUS COMMUNICATION OF NEEDS Skin   Limited Assist Verbally                         Personal Care Assistance Level of Assistance  Bathing, Feeding, Dressing Bathing Assistance: Limited assistance Feeding assistance: Independent Dressing Assistance: Limited assistance     Functional Limitations Info  Sight, Hearing, Speech Sight Info: Impaired Hearing Info: Impaired Speech Info: Adequate    SPECIAL CARE FACTORS FREQUENCY  PT (By licensed PT), OT (By licensed OT)     PT Frequency: 5x per week OT Frequency: 5x  per week            Contractures Contractures Info: Not present    Additional Factors Info  Code Status, Allergies Code Status Info: FULL Allergies Info: NKA           Current Medications (07/25/2023):  This is the current hospital active medication list Current Facility-Administered Medications  Medication Dose Route Frequency Provider Last Rate Last Admin   acetaminophen  (TYLENOL ) tablet 650 mg  650 mg Oral Q6H PRN Patel, Ekta V, MD       Or   acetaminophen  (TYLENOL ) suppository 650 mg  650 mg Rectal Q6H PRN Patel, Ekta V, MD       aspirin  chewable tablet 81 mg  81 mg Oral Daily Patel, Ekta V, MD   81 mg at 07/25/23 9078   Chlorhexidine  Gluconate Cloth 2 % PADS 6 each  6 each Topical Daily Tobie Mario GAILS, MD   6 each at 07/25/23 1000   cyanocobalamin  (VITAMIN B12) tablet 1,000 mcg  1,000 mcg Oral Daily Patel, Ekta V, MD   1,000 mcg at 07/25/23 0920   finasteride  (PROSCAR ) tablet 5 mg  5 mg Oral Daily Patel, Ekta V, MD   5 mg at 07/25/23 0920   gabapentin  (NEURONTIN ) capsule 100 mg  100 mg Oral TID Patel, Ekta V, MD   100 mg at 07/25/23 0921   heparin  injection 5,000 Units  5,000 Units Subcutaneous Q8H Tobie Mario GAILS, MD  5,000 Units at 07/25/23 0602   hydrALAZINE  (APRESOLINE ) injection 5 mg  5 mg Intravenous Q4H PRN Patel, Ekta V, MD       HYDROcodone -acetaminophen  (NORCO/VICODIN) 5-325 MG per tablet 2 tablet  2 tablet Oral Q6H PRN Patel, Ekta V, MD   2 tablet at 07/25/23 0921   morphine  (PF) 2 MG/ML injection 2 mg  2 mg Intravenous Q4H PRN Patel, Ekta V, MD       Oral care mouth rinse  15 mL Mouth Rinse PRN Tobie Mario GAILS, MD       pravastatin  (PRAVACHOL ) tablet 20 mg  20 mg Oral q1800 Tobie Mario V, MD   20 mg at 07/24/23 1640   sertraline  (ZOLOFT ) tablet 25 mg  25 mg Oral QHS Patel, Ekta V, MD   25 mg at 07/24/23 2049   sodium chloride  flush (NS) 0.9 % injection 3 mL  3 mL Intravenous Q12H Tobie Mario V, MD   3 mL at 07/25/23 0921   [START ON 07/26/2023]  sulfamethoxazole -trimethoprim  (BACTRIM  DS) 800-160 MG per tablet 1 tablet  1 tablet Oral Q12H Pham, Minh Q, RPH-CPP       tamsulosin  (FLOMAX ) capsule 0.4 mg  0.4 mg Oral Daily Patel, Ekta V, MD   0.4 mg at 07/25/23 0920   thiamine  (VITAMIN B1) tablet 100 mg  100 mg Oral Daily Patel, Ekta V, MD   100 mg at 07/25/23 0920     Discharge Medications: Please see discharge summary for a list of discharge medications.  Relevant Imaging Results:  Relevant Lab Results:   Additional Information [id number]  Montie LOISE Louder, LCSW

## 2023-07-25 NOTE — Progress Notes (Addendum)
 Foley changed at 1540 as per the order, had urine returned during the procedure but urine output after couple hours of foley insertion is only approximately 10ml, bladder scan showed only 50ml, informed to the MD.  Encouraged the patient for fluid intake.

## 2023-07-25 NOTE — Progress Notes (Signed)
 Ok to optimized ceftriaxone  to septra  to complete 7d per Dr. Raenelle.  Sergio Batch, PharmD, BCIDP, AAHIVP, CPP Infectious Disease Pharmacist 07/25/2023 8:54 AM

## 2023-07-25 NOTE — Progress Notes (Signed)
 Mobility Specialist Progress Note:    07/25/23 1042  Mobility  Activity Transferred from bed to chair  Level of Assistance Minimal assist, patient does 75% or more  Assistive Device Front wheel walker  Distance Ambulated (ft) 4 ft  Activity Response Tolerated well  Mobility Referral Yes  Mobility visit 1 Mobility  Mobility Specialist Start Time (ACUTE ONLY) 1042  Mobility Specialist Stop Time (ACUTE ONLY) 1052  Mobility Specialist Time Calculation (min) (ACUTE ONLY) 10 min   Pt received in bed, agreeable to mobility session after slight encouragement. MinA+2 to stand with RW for safety. Tolerated well, asx throughout. Sitting up in chair with alarm on, call bell in reach and all needs met. RN notified.   Soliyana Mcchristian Mobility Specialist Please contact via Special educational needs teacher or  Rehab office at [phone]

## 2023-07-25 NOTE — Plan of Care (Signed)
  Problem: Skin Integrity: Goal: Risk for impaired skin integrity will decrease Outcome: Progressing   Problem: Education: Goal: Knowledge of General Education information will improve Description: Including pain rating scale, medication(s)/side effects and non-pharmacologic comfort measures Outcome: Progressing   Problem: Clinical Measurements: Goal: Ability to maintain clinical measurements within normal limits will improve Outcome: Progressing Goal: Will remain free from infection Outcome: Progressing Goal: Diagnostic test results will improve Outcome: Progressing Goal: Respiratory complications will improve Outcome: Progressing Goal: Cardiovascular complication will be avoided Outcome: Progressing   Problem: Pain Managment: Goal: General experience of comfort will improve and/or be controlled Outcome: Progressing   Problem: Safety: Goal: Ability to remain free from injury will improve Outcome: Progressing   Problem: Skin Integrity: Goal: Risk for impaired skin integrity will decrease Outcome: Progressing

## 2023-07-26 DIAGNOSIS — W19XXXA Unspecified fall, initial encounter: Secondary | ICD-10-CM | POA: Diagnosis not present

## 2023-07-26 DIAGNOSIS — N390 Urinary tract infection, site not specified: Secondary | ICD-10-CM | POA: Diagnosis not present

## 2023-07-26 DIAGNOSIS — Y92009 Unspecified place in unspecified non-institutional (private) residence as the place of occurrence of the external cause: Secondary | ICD-10-CM | POA: Diagnosis not present

## 2023-07-26 LAB — GLUCOSE, CAPILLARY
Glucose-Capillary: 122 mg/dL — ABNORMAL HIGH (ref 70–99)
Glucose-Capillary: 126 mg/dL — ABNORMAL HIGH (ref 70–99)
Glucose-Capillary: 114 mg/dL — ABNORMAL HIGH (ref 70–99)
Glucose-Capillary: 165 mg/dL — ABNORMAL HIGH (ref 70–99)

## 2023-07-26 NOTE — TOC Progression Note (Signed)
 Transition of Care (TOC) - Progression Note    Patient Details  Name: TREVIONNE ADVANI Sr. MRN: [id number] Date of Birth: [birth year]  [facility]) CM/SW Contact  Montie LOISE Louder, [place] Phone Number: 07/26/2023, 3:34 PM  Clinical Narrative:     Mr Santucci is approved 7/16 - 7/18 Plan Auth PI#[id number] - per MD, anticipate d/c tomorrow to [facility]   Montie Louder, MSW, LCSW Clinical Social Worker    Expected Discharge Plan: Skilled Nursing Facility Barriers to Discharge: Continued Medical Work up  Expected Discharge Plan and Services In-house Referral: Clinical Social Work                                             Social Determinants of Health (SDOH) Interventions SDOH Screenings   Food Insecurity: No Food Insecurity (07/22/2023)  Housing: Low Risk  (07/22/2023)  Transportation Needs: No Transportation Needs (07/22/2023)  Utilities: Not At Risk (07/22/2023)  Depression (PHQ2-9): Low Risk  (03/25/2021)  Social Connections: Socially Isolated (07/22/2023)  Tobacco Use: High Risk (07/21/2023)    Readmission Risk Interventions     No data to display

## 2023-07-26 NOTE — TOC Progression Note (Signed)
 Transition of Care (TOC) - Progression Note    Patient Details  Name: Edgar COMMON Sr. MRN: [id number] Date of Birth: [birth year]  Transition of Care [facility]) CM/SW Contact  Montie LOISE Louder, [place] Phone Number: 07/26/2023, 2:24 PM  Clinical Narrative:     Insurance shara pending w/ [employer] # Mayme shara PI#[id number]   Expected Discharge Plan: Skilled Nursing Facility Barriers to Discharge: Continued Medical Work up  Expected Discharge Plan and Services In-house Referral: Clinical Social Work                                             Social Determinants of Health (SDOH) Interventions SDOH Screenings   Food Insecurity: No Food Insecurity (07/22/2023)  Housing: Low Risk  (07/22/2023)  Transportation Needs: No Transportation Needs (07/22/2023)  Utilities: Not At Risk (07/22/2023)  Depression (PHQ2-9): Low Risk  (03/25/2021)  Social Connections: Socially Isolated (07/22/2023)  Tobacco Use: High Risk (07/21/2023)    Readmission Risk Interventions     No data to display

## 2023-07-26 NOTE — Evaluation (Signed)
 Speech Language Pathology Evaluation Patient Details Name: Edgar FERRUFINO Sr. MRN: [id number] DOB: 1935/01/11 Today's Date: 07/26/2023 Time: 9159-9099 SLP Time Calculation (min) (ACUTE ONLY): 20 min  Problem List:  Patient Active Problem List   Diagnosis Date Noted   Acute UTI 07/22/2023   Fall at home, initial encounter 07/21/2023   TIA (transient ischemic attack) 12/07/2020   Urinary retention 12/05/2020   Muscle weakness (generalized) 12/05/2020   Unsteadiness on feet 12/05/2020   Frailty 12/05/2020   Acute cystitis with hematuria    Poor social situation    Obstruction of Foley catheter (HCC) 11/30/2020   Past Medical History:  Past Medical History:  Diagnosis Date   Urinary retention    Past Surgical History:  Past Surgical History:  Procedure Laterality Date   PROSTATE BIOPSY  1990   HPI:  88 yo male found to have UTI s/p fall with generalize weakness  PMH chronic urinary retention indwelling foley catheter, hx of falls, positive RPR latent syphilis s/p tx 2002   Assessment / Plan / Recommendation Clinical Impression  Patient was evaluated via the Cognistat to assess cognitive linguistic functioning. Patient reports living with son and daughter in law prior to admission with baseline cognitive deficits. Son previously took care of finances and medications. Patient scored WFL on all subtests of evaluation (attention, problem solving, awareness) despite severe short term memory impairments noted through performance on delayed recall. Functional expressive/receptive language. Oral mechanism exam WFL. Recommend ST follow up at next venue of care; SNF <3 hours per day.    SLP Assessment  SLP Recommendation/Assessment: All further Speech Language Pathology needs can be addressed in the next venue of care SLP Visit Diagnosis: Cognitive communication deficit (R41.841)     Assistance Recommended at Discharge  Intermittent Supervision/Assistance  Functional Status Assessment  Patient has had a recent decline in their functional status and demonstrates the ability to make significant improvements in function in a reasonable and predictable amount of time.     SLP Evaluation Cognition  Overall Cognitive Status: No family/caregiver present to determine baseline cognitive functioning Arousal/Alertness: Awake/alert Orientation Level: Oriented X4 Year: 2026 Month: July Day of Week: Incorrect Attention: Sustained Sustained Attention: Appears intact Memory: Impaired Memory Impairment: Decreased short term memory Decreased Short Term Memory: Verbal basic;Functional basic Awareness: Appears intact Problem Solving: Appears intact       Comprehension  Auditory Comprehension Overall Auditory Comprehension: Appears within functional limits for tasks assessed Yes/No Questions: Within Functional Limits Commands: Within Functional Limits    Expression Expression Primary Mode of Expression: Verbal Verbal Expression Overall Verbal Expression: Appears within functional limits for tasks assessed   Oral / Motor  Oral Motor/Sensory Function Overall Oral Motor/Sensory Function: Within functional limits Motor Speech Overall Motor Speech: Other (comment) (articulation likely at b/l) Respiration: Within functional limits Phonation: Normal Resonance: Within functional limits Intelligibility: Intelligibility reduced Motor Planning: Within functional limits Motor Speech Errors: Not applicable            Michael Walrath M.A., CCC-SLP 07/26/2023, 9:17 AM

## 2023-07-26 NOTE — Progress Notes (Signed)
 Occupational Therapy Treatment Patient Details Name: Edgar SCHAPPERT Sr. MRN: [id number] DOB: [birth year] Today's Date: 07/26/2023   History of present illness 88 yo male found to have UTI s/p fall with generalize weakness  PMH chronic urinary retention indwelling foley catheter, hx of falls, positive RPR latent syphilis s/p tx 2002   OT comments  Pt presented in chair from PT session. Pt agreeable to complete light ADLS while witting in the chair. Pt completed oral care post set up, application with deodorant with difficulty due to limited AROM, UE dressing with min assist and max-total for hygiene of B feet. Patient will benefit from continued inpatient follow up therapy, <3 hours/day.      If plan is discharge home, recommend the following:  A lot of help with walking and/or transfers;A lot of help with bathing/dressing/bathroom;Direct supervision/assist for medications management   Equipment Recommendations  Other (comment) (TBD)    Recommendations for Other Services      Precautions / Restrictions Precautions Precautions: Fall Recall of Precautions/Restrictions: Intact Restrictions Weight Bearing Restrictions Per Provider Order: No       Mobility Bed Mobility               General bed mobility comments: pt presented in chair from PT session    Transfers                   General transfer comment: NT due to just finishing with PT     Balance Overall balance assessment: Needs assistance Sitting-balance support: Feet supported Sitting balance-Leahy Scale: Fair Sitting balance - Comments: attempted to work on unsupported in recliner                                   ADL either performed or assessed with clinical judgement   ADL Overall ADL's : Needs assistance/impaired Eating/Feeding: Set up;Sitting   Grooming: Set up;Sitting   Upper Body Bathing: Minimal assistance;Sitting   Lower Body Bathing: Maximal assistance;Sitting/lateral  leans   Upper Body Dressing : Minimal assistance;Sitting   Lower Body Dressing: Maximal assistance;Sitting/lateral leans                      Extremity/Trunk Assessment Upper Extremity Assessment Upper Extremity Assessment: Generalized weakness;Right hand dominant;RUE deficits/detail;LUE deficits/detail RUE Deficits / Details: Pt has decrease in AROM in BUE to about 50 degrees but no pain reproted RUE Sensation: WNL RUE Coordination: decreased gross motor LUE Deficits / Details: Pt has decrease in AROM in BUE to about 50 degrees but no pain reported   Lower Extremity Assessment Lower Extremity Assessment: Defer to PT evaluation        Vision   Additional Comments: pt reports change in vision oer years and feeling itch in eyes   Perception Perception Perception: Within Functional Limits   Praxis Praxis Praxis: [facility]   Communication Communication Communication: Impaired Factors Affecting Communication: Reduced clarity of speech   Cognition Arousal: Alert Behavior During Therapy: WFL for tasks assessed/performed Cognition: No apparent impairments             OT - Cognition Comments: Pt self reporting stm deficits but overall WFL                 Following commands: Intact        Cueing   Cueing Techniques: Verbal cues, Tactile cues  Exercises      Shoulder Instructions  General Comments HR WNL throughout session.    Pertinent Vitals/ Pain       Pain Assessment Pain Assessment: Faces Faces Pain Scale: Hurts a little bit Pain Location: general aching Pain Descriptors / Indicators: Discomfort Pain Intervention(s): Limited activity within patient's tolerance, Monitored during session  Home Living     Available Help at Discharge: Family;Available 24 hours/day Type of Home: House                              Lives With: Family    Prior Functioning/Environment              Frequency  Min 2X/week         Progress Toward Goals  OT Goals(current goals can now be found in the care plan section)  Progress towards OT goals: Progressing toward goals  Acute Rehab OT Goals Patient Stated Goal: to get better OT Goal Formulation: With patient Time For Goal Achievement: 08/05/23 Potential to Achieve Goals: Good ADL Goals Pt Will Perform Lower Body Dressing: with contact guard assist;sit to/from stand Pt Will Transfer to Toilet: with contact guard assist;stand pivot transfer;bedside commode Pt Will Perform Toileting - Clothing Manipulation and hygiene: with contact guard assist;sit to/from stand  Plan      Co-evaluation                 AM-PAC OT 6 Clicks Daily Activity     Outcome Measure   Help from another person eating meals?: None Help from another person taking care of personal grooming?: A Little Help from another person toileting, which includes using toliet, bedpan, or urinal?: A Lot Help from another person bathing (including washing, rinsing, drying)?: A Lot Help from another person to put on and taking off regular upper body clothing?: A Little Help from another person to put on and taking off regular lower body clothing?: A Lot 6 Click Score: 16    End of Session Equipment Utilized During Treatment: Gait belt;Rolling walker (2 wheels)  OT Visit Diagnosis: Unsteadiness on feet (R26.81);Other abnormalities of gait and mobility (R26.89);Muscle weakness (generalized) (M62.81)   Activity Tolerance Patient tolerated treatment well   Patient Left in chair;with call bell/phone within reach   Nurse Communication Mobility status        Time: 0940-1007 OT Time Calculation (min): 27 min  Charges: OT General Charges $OT Visit: 1 Visit OT Treatments $Self Care/Home Management : 23-37 mins  Warrick POUR OTR/L  Acute Rehab Services  [phone] office number   Warrick Berber 07/26/2023, 10:18 AM

## 2023-07-26 NOTE — Progress Notes (Signed)
 Physical Therapy Treatment Patient Details Name: Edgar HEGEMAN Sr. MRN: [id number] DOB: [birth year] Today's Date: 07/26/2023   History of Present Illness 88 yo male found to have UTI s/p fall with generalize weakness  PMH chronic urinary retention indwelling foley catheter, hx of falls, positive RPR latent syphilis s/p tx 2002    PT Comments  Pt is slowly progressing towards goals. Attempted to get pt to ambulate further today with pt experiencing difficulty clearing the RLE from the floor reporting pain in the L low back with WB through LLE. Once chair next to pt after 3 attempts for gait pt was able to clear the RLE from the floor to take steps from EOB to chair; possibly pt is limited due to fear of falling and would benefit from chair follow for further gait. Mod to Min A for sit to stand and Min A for transfers. Pt was Min A for bed mobility. Due to pt current functional status, home set up and available assistance at home recommending skilled physical therapy services < 3 hours/day in order to address strength, balance and functional mobility to decrease risk for falls, injury, immobility, skin break down and re-hospitalization.      If plan is discharge home, recommend the following: A lot of help with walking and/or transfers;Help with stairs or ramp for entrance;Assist for transportation;Assistance with cooking/housework   Can travel by private vehicle     No  Equipment Recommendations  Rolling walker (2 wheels);BSC/3in1;Other (comment) (24/7 physical assist)       Precautions / Restrictions Precautions Precautions: Fall Recall of Precautions/Restrictions: Intact Restrictions Weight Bearing Restrictions Per Provider Order: No     Mobility  Bed Mobility Overal bed mobility: Needs Assistance Bed Mobility: Supine to Sit     Supine to sit: Min assist, Used rails, HOB elevated     General bed mobility comments: Heavy use of bed rails and mIn A at the trunk and LE to get to mid  line.    Transfers Overall transfer level: Needs assistance Equipment used: Rolling walker (2 wheels) Transfers: Sit to/from Stand Sit to Stand: Mod assist, Min assist, From elevated surface           General transfer comment: Mod A for intial momentum for sit to stand from EOB and MIn A for attempts 2-4 with occasional cues for correct hand placement    Ambulation/Gait Ambulation/Gait assistance: Min assist Gait Distance (Feet): 4 Feet Assistive device: Rolling walker (2 wheels) Gait Pattern/deviations: Step-through pattern, Decreased step length - right, Decreased step length - left, Wide base of support, Trunk flexed Gait velocity: very slow Gait velocity interpretation: <1.31 ft/sec, indicative of household ambulator   General Gait Details: Pt has partial step through gait pattern with low foot clearance and flat foot intial contact with flexed posture and constant cues for body proximity to AD. Initially attempted to get pt to ambulate around the bed. Pt R foot was not clearing the floor consistently for 3 tries despite heavy cues for wgt shifting. Once chair next to pt pt lift the RLE to step; possibly anxiety related due to fear of falling.      Balance Overall balance assessment: Needs assistance Sitting-balance support: No upper extremity supported, Feet supported Sitting balance-Leahy Scale: Fair     Standing balance support: Bilateral upper extremity supported, During functional activity, Reliant on assistive device for balance Standing balance-Leahy Scale: Poor Standing balance comment: Reliant on BUE suport        Communication Communication Communication:  Impaired Factors Affecting Communication: Reduced clarity of speech  Cognition Arousal: Alert Behavior During Therapy: WFL for tasks assessed/performed   PT - Cognitive impairments: No apparent impairments     Following commands: Intact      Cueing Cueing Techniques: Verbal cues, Tactile cues      General Comments General comments (skin integrity, edema, etc.): HR WNL throughout session.      Pertinent Vitals/Pain Pain Assessment Pain Assessment: No/denies pain    Home Living     Available Help at Discharge: Family;Available 24 hours/day Type of Home: House         PT Goals (current goals can now be found in the care plan section) Acute Rehab PT Goals Patient Stated Goal: to move better and talk to his son about rehab PT Goal Formulation: With patient Time For Goal Achievement: 08/06/23 Potential to Achieve Goals: Fair Progress towards PT goals: Progressing toward goals    Frequency    Min 1X/week      PT Plan  Continue with current POC        AM-PAC PT 6 Clicks Mobility   Outcome Measure  Help needed turning from your back to your side while in a flat bed without using bedrails?: A Little Help needed moving from lying on your back to sitting on the side of a flat bed without using bedrails?: A Little Help needed moving to and from a bed to a chair (including a wheelchair)?: A Little Help needed standing up from a chair using your arms (e.g., wheelchair or bedside chair)?: A Lot Help needed to walk in hospital room?: Total Help needed climbing 3-5 steps with a railing? : Total 6 Click Score: 13    End of Session Equipment Utilized During Treatment: Gait belt Activity Tolerance: Patient tolerated treatment well;Patient limited by fatigue Patient left: in chair;with chair alarm set;Other (comment) (OT in the room) Nurse Communication: Mobility status PT Visit Diagnosis: Unsteadiness on feet (R26.81);Other abnormalities of gait and mobility (R26.89)     Time: 9080-9060 PT Time Calculation (min) (ACUTE ONLY): 20 min  Charges:    $Therapeutic Activity: 8-22 mins PT General Charges $$ ACUTE PT VISIT: 1 Visit                    Dorothyann Maier, DPT, CLT  Acute Rehabilitation Services Office: [phone] (Secure chat preferred)    Dorothyann VEAR Maier 07/26/2023, 9:47 AM

## 2023-07-26 NOTE — Progress Notes (Signed)
 PROGRESS NOTE    Edgar BREEDEN Sr.  FMW:[id number] DOB: November 20, 1935 DOA: 07/21/2023 PCP: Darra Hamilton, PA-C    Brief Narrative:  88 year old man with chronic indwelling Foley secondary to urinary retention and history of falls presented with recurrent fall and generalized weakness. Workup in ED was notable for abnormal UA and fever of 99.1 although patient had no leukocytosis. He was treated as UTI . Now waiting for SNF  Subjective: Patient seen and examined.  Catheter exchanged.  No overnight events.  He is weak but denies any other acute complaints.. Assessment & Plan:   Complicated UTI secondary to chronic indwelling Foley catheter: Urine culture with Proteus and procidentia, blood cultures were not collected. Patient treated with 4 days of ceftriaxone , changing to Bactrim  to complete 7 days of therapy.   Foley catheter was changed 7/15.   Continue Flomax  and finasteride . Change Foley catheter today.  Anxiety depression: On sertraline .  Continue.  History of TIA: On aspirin  and statin.  Continue.  Profound physical debility: Per PT OT and skilled nursing facility.   DVT prophylaxis: heparin  injection 5,000 Units Start: 07/21/23 2200   Code Status: Full code Family Communication: None at the bedside Disposition Plan: Status is: Inpatient Remains inpatient appropriate because: Medically stable.  Needs SNF bed.     Consultants:  None  Procedures:  None  Antimicrobials:  Rocephin  7/12--7/15 Bactrim  7/16---     Objective: Vitals:   07/25/23 1923 07/25/23 2300 07/26/23 0542 07/26/23 0803  BP: (!) 129/59 128/64 (!) 134/58 (!) 122/59  Pulse:  (!) 55  73  Resp: 11 13 18 15   Temp: 97.8 F (36.6 C) 98 F (36.7 C) 98.4 F (36.9 C) 97.7 F (36.5 C)  TempSrc: Oral Oral Oral Oral  SpO2:  98% 98% 98%  Weight:   106.6 kg   Height:        Intake/Output Summary (Last 24 hours) at 07/26/2023 1134 Last data filed at 07/26/2023 0542 Gross per 24 hour  Intake 240 ml   Output 1400 ml  Net -1160 ml   Filed Weights   07/22/23 0253 07/23/23 0500 07/26/23 0542  Weight: 105 kg 106.5 kg 106.6 kg    Examination:  General exam: Appears calm and comfortable.  Debilitated.  Pleasant interaction. Respiratory system: Clear to auscultation. Respiratory effort normal. Cardiovascular system: S1 & S2 heard, RRR.  Gastrointestinal system: Soft and nontender.  Foley catheter with clear urine. Central nervous system: Alert and oriented. No focal neurological deficits.  Gross generalized weakness.    Data Reviewed: I have personally reviewed following labs and imaging studies  CBC: Recent Labs  Lab 07/21/23 1310 07/21/23 1957 07/22/23 0338  WBC 8.7 7.9 7.2  NEUTROABS 5.2  --   --   HGB 13.1 12.9* 12.5*  HCT 41.2 41.3 39.0  MCV 86.9 86.9 86.5  PLT 167 168 156   Basic Metabolic Panel: Recent Labs  Lab 07/21/23 1310 07/21/23 1957 07/22/23 0338 07/22/23 1313 07/23/23 0358  NA 141  --  138 141  --   K 4.0  --  3.7 3.9  --   CL 108  --  110 105  --   CO2 24  --  23 23  --   GLUCOSE 78  --  113* 134*  --   BUN 10  --  10 12  --   CREATININE 0.88 0.83 0.80 0.80  --   CALCIUM  9.4  --  8.6* 9.2  --   MG  --   --  1.6*  --  2.0  PHOS  --   --  3.1  --   --    GFR: Estimated Creatinine Clearance: 79.8 mL/min (by C-G formula based on SCr of 0.8 mg/dL). Liver Function Tests: Recent Labs  Lab 07/21/23 1310 07/22/23 0338  AST 25 23  ALT 25 22  ALKPHOS 41 36*  BILITOT 0.5 0.6  PROT 6.6 6.2*  ALBUMIN 3.5 3.1*   No results for input(s): LIPASE, AMYLASE in the last 168 hours. No results for input(s): AMMONIA in the last 168 hours. Coagulation Profile: No results for input(s): INR, PROTIME in the last 168 hours. Cardiac Enzymes: Recent Labs  Lab 07/21/23 1310  CKTOTAL 197   BNP (last 3 results) No results for input(s): PROBNP in the last 8760 hours. HbA1C: No results for input(s): HGBA1C in the last 72 hours. CBG: Recent Labs   Lab 07/24/23 2109 07/25/23 0611 07/25/23 1206 07/25/23 1643 07/26/23 0620  GLUCAP 147* 111* 141* 207* 122*   Lipid Profile: No results for input(s): CHOL, HDL, LDLCALC, TRIG, CHOLHDL, LDLDIRECT in the last 72 hours. Thyroid Function Tests: No results for input(s): TSH, T4TOTAL, FREET4, T3FREE, THYROIDAB in the last 72 hours. Anemia Panel: No results for input(s): VITAMINB12, FOLATE, FERRITIN, TIBC, IRON , RETICCTPCT in the last 72 hours. Sepsis Labs: No results for input(s): PROCALCITON, LATICACIDVEN in the last 168 hours.  Recent Results (from the past 240 hours)  Urine Culture     Status: Abnormal   Collection Time: 07/21/23  2:42 PM   Specimen: Urine, Random  Result Value Ref Range Status   Specimen Description URINE, RANDOM  Final   Special Requests   Final    NONE Reflexed from [id number] Performed at [facility], [address], [place], [place] [address]    Culture (A)  Final    >=100,000 COLONIES/mL PROTEUS MIRABILIS 80,000 COLONIES/mL PROVIDENCIA STUARTII    Report Status 07/24/2023 FINAL  Final   Organism ID, Bacteria PROTEUS MIRABILIS (A)  Final   Organism ID, Bacteria PROVIDENCIA STUARTII (A)  Final      Susceptibility   Proteus mirabilis - MIC*    AMPICILLIN <=2 SENSITIVE Sensitive     CEFAZOLIN <=4 SENSITIVE Sensitive     CEFEPIME <=0.12 SENSITIVE Sensitive     CEFTRIAXONE  <=0.25 SENSITIVE Sensitive     CIPROFLOXACIN  0.5 INTERMEDIATE Intermediate     GENTAMICIN <=1 SENSITIVE Sensitive     IMIPENEM 8 INTERMEDIATE Intermediate     NITROFURANTOIN  128 RESISTANT Resistant     TRIMETH /SULFA  <=20 SENSITIVE Sensitive     AMPICILLIN/SULBACTAM <=2 SENSITIVE Sensitive     PIP/TAZO <=4 SENSITIVE Sensitive ug/mL    * >=100,000 COLONIES/mL PROTEUS MIRABILIS   Providencia stuartii - MIC*    AMPICILLIN RESISTANT Resistant     CEFEPIME <=0.12 SENSITIVE Sensitive     CEFTRIAXONE  <=0.25 SENSITIVE Sensitive     CIPROFLOXACIN   <=0.25 SENSITIVE Sensitive     GENTAMICIN RESISTANT Resistant     IMIPENEM 1 SENSITIVE Sensitive     NITROFURANTOIN  256 RESISTANT Resistant     TRIMETH /SULFA  <=20 SENSITIVE Sensitive     AMPICILLIN/SULBACTAM 16 INTERMEDIATE Intermediate     PIP/TAZO <=4 SENSITIVE Sensitive ug/mL    * 80,000 COLONIES/mL PROVIDENCIA STUARTII  Resp Panel by RT-PCR (Flu A&B, Covid) Anterior Nasal Swab     Status: None   Collection Time: 07/21/23  5:12 PM   Specimen: Anterior Nasal Swab  Result Value Ref Range Status   SARS Coronavirus 2 by RT PCR NEGATIVE  NEGATIVE Final   Influenza A by PCR NEGATIVE NEGATIVE Final   Influenza B by PCR NEGATIVE NEGATIVE Final    Comment: (NOTE) The Xpert Xpress SARS-CoV-2/FLU/RSV plus assay is intended as an aid in the diagnosis of influenza from Nasopharyngeal swab specimens and should not be used as a sole basis for treatment. Nasal washings and aspirates are unacceptable for Xpert Xpress SARS-CoV-2/FLU/RSV testing.  Fact Sheet for Patients: BloggerCourse.com  Fact Sheet for Healthcare Providers: SeriousBroker.it  This test is not yet approved or cleared by the [place]  FDA and has been authorized for detection and/or diagnosis of SARS-CoV-2 by FDA under an Emergency Use Authorization (EUA). This EUA will remain in effect (meaning this test can be used) for the duration of the COVID-19 declaration under Section 564(b)(1) of the Act, 21 U.S.C. section 360bbb-3(b)(1), unless the authorization is terminated or revoked.  Performed at [facility], [address], [place], [place] [address]          Radiology Studies: No results found.      Scheduled Meds:  aspirin   81 mg Oral Daily   Chlorhexidine  Gluconate Cloth  6 each Topical Daily   cyanocobalamin   1,000 mcg Oral Daily   finasteride   5 mg Oral Daily   gabapentin   100 mg Oral TID   heparin   5,000 Units Subcutaneous Q8H   pravastatin   20  mg Oral q1800   sertraline   25 mg Oral QHS   sodium chloride  flush  3 mL Intravenous Q12H   sulfamethoxazole -trimethoprim   1 tablet Oral Q12H   tamsulosin   0.4 mg Oral Daily   thiamine   100 mg Oral Daily   Continuous Infusions:   LOS: 5 days    Time spent: 40 minutes    Renato Applebaum, MD [facility]

## 2023-07-26 NOTE — TOC Progression Note (Signed)
 Transition of Care (TOC) - Progression Note    Patient Details  Name: Edgar OESTREICHER Sr. MRN: [id number] Date of Birth: Dec 02, 1935  Transition of Care [facility]) CM/SW Contact  Edgar Mckay, [place] Phone Number: 07/26/2023, 10:30 AM  Clinical Narrative:     [facility] reports no bed availability. Spoke with patient's son, updated on [facility], his other choice is [facility].   Sent [employer], waiting on response.  Edgar Mckay, MSW, LCSW Clinical Social Worker    Expected Discharge Plan: Skilled Nursing Facility Barriers to Discharge: Continued Medical Work up  Expected Discharge Plan and Services In-house Referral: Clinical Social Work                                             Social Determinants of Health (SDOH) Interventions SDOH Screenings   Food Insecurity: No Food Insecurity (07/22/2023)  Housing: Low Risk  (07/22/2023)  Transportation Needs: No Transportation Needs (07/22/2023)  Utilities: Not At Risk (07/22/2023)  Depression (PHQ2-9): Low Risk  (03/25/2021)  Social Connections: Socially Isolated (07/22/2023)  Tobacco Use: High Risk (07/21/2023)    Readmission Risk Interventions     No data to display

## 2023-07-27 DIAGNOSIS — N390 Urinary tract infection, site not specified: Secondary | ICD-10-CM | POA: Diagnosis not present

## 2023-07-27 DIAGNOSIS — W19XXXA Unspecified fall, initial encounter: Secondary | ICD-10-CM | POA: Diagnosis not present

## 2023-07-27 DIAGNOSIS — Y92009 Unspecified place in unspecified non-institutional (private) residence as the place of occurrence of the external cause: Secondary | ICD-10-CM | POA: Diagnosis not present

## 2023-07-27 LAB — GLUCOSE, CAPILLARY: Glucose-Capillary: 122 mg/dL — ABNORMAL HIGH (ref 70–99)

## 2023-07-27 MED ORDER — SULFAMETHOXAZOLE-TRIMETHOPRIM 800-160 MG PO TABS
1.0000 | ORAL_TABLET | Freq: Two times a day (BID) | ORAL | Status: DC
Start: 2023-07-27 — End: 2023-07-27

## 2023-07-27 MED ORDER — SULFAMETHOXAZOLE-TRIMETHOPRIM 800-160 MG PO TABS: 1.0000 | ORAL_TABLET | Freq: Two times a day (BID) | ORAL | Status: AC

## 2023-07-27 MED ORDER — IBUPROFEN 200 MG PO TABS: 200.0000 mg | ORAL_TABLET | Freq: Four times a day (QID) | ORAL | 0 refills | Status: DC | PRN

## 2023-07-27 MED ORDER — POLYETHYLENE GLYCOL 3350 17 G PO PACK: 17.0000 g | PACK | Freq: Every day | ORAL | Status: DC

## 2023-07-27 MED ORDER — POLYETHYLENE GLYCOL 3350 17 G PO PACK
17.0000 g | PACK | Freq: Every day | ORAL | Status: DC
  Administered 2023-07-27: 17 g via ORAL
  Filled 2023-07-27: qty 1

## 2023-07-27 NOTE — TOC Transition Note (Signed)
 Transition of Care [facility]) - Discharge Note   Patient Details  Name: Edgar BONIFACE Sr. MRN: [id number] Date of Birth: [birth year]  Transition of Care [facility]) CM/SW Contact:  Edgar LOISE Louder, LCSW Phone Number: 07/27/2023, 11:22 AM   Clinical Narrative:     Patient will Discharge to: [facility] Discharge Date: 07/27/23 Family Notified:son  Transport By: [facility]  Per MD patient is ready for discharge. RN, patient, and facility notified of discharge. Discharge Summary sent to facility. RN given number for [phone], Room 505. Ambulance transport requested for patient.   Clinical Social Worker signing off.  Edgar Mckay, MSW, LCSW Clinical Social Worker    Final next level of care: Skilled Nursing Facility Barriers to Discharge: Continued Medical Work up   Patient Goals and CMS Choice            Discharge Placement              Patient chooses bed at: [facility] Patient to be transferred to facility by: ptar Name of family member notified: son Patient and family notified of of transfer: 07/27/23  Discharge Plan and Services Additional resources added to the After Visit Summary for   In-house Referral: Clinical Social Work                                   Social Drivers of Health (SDOH) Interventions SDOH Screenings   Food Insecurity: No Food Insecurity (07/22/2023)  Housing: Low Risk  (07/22/2023)  Transportation Needs: No Transportation Needs (07/22/2023)  Utilities: Not At Risk (07/22/2023)  Depression (PHQ2-9): Low Risk  (03/25/2021)  Social Connections: Socially Isolated (07/22/2023)  Tobacco Use: High Risk (07/21/2023)     Readmission Risk Interventions     No data to display

## 2023-07-27 NOTE — Discharge Summary (Addendum)
 Physician Discharge Summary  Edgar Mckay Sr. FMW:[id number] DOB: [birth year] DOA: 07/21/2023  PCP: Darra Hamilton, PA-C  Admit date: 07/21/2023 Discharge date: 07/27/2023  Admitted From: Home Disposition: Skilled nursing facility  Recommendations for Outpatient Follow-up:  Follow up with PCP in 1-2 weeks Please obtain BMP/CBC in one week    Discharge Condition: Stable CODE STATUS: Full code Diet recommendation: Regular diet, nutritional supplements  Discharge summary: 88 year old man with chronic indwelling Foley secondary to urinary retention and history of falls presented with recurrent fall and generalized weakness. Workup in ED was notable for abnormal UA and fever of 99.1 although patient had no leukocytosis. He was treated as UTI .  Clinically improved.  Treated for following conditions.   Assessment & Plan:   Complicated UTI secondary to chronic indwelling Foley catheter: Urine culture with Proteus and procidentia, blood cultures were not collected. Patient treated with 4 days of ceftriaxone , changing to Bactrim  to complete 7 days of therapy.   One more day of Bactrim   Foley catheter was changed 7/15.  He has some leaking around the catheter, however there is no evidence of retention. Patient is no more taking Flomax  or finasteride .  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   Profound physical debility: Per PT OT and skilled nursing facility.  The patient is on multivitamins and B12 replacements.  History of anxiety, TIA: Patient is not on any maintenance medications.  This was reviewed with patient and family.  He is not taking any aspirin , statins or sertraline .  This is by his choice.  Only taking ibuprofen  as needed.  Stable to discharge to a skilled level of care today.  Discharge Diagnoses:  Principal Problem:   Fall at home, initial encounter Active Problems:   Acute UTI    Discharge  Instructions  Discharge Instructions     Diet general   Complete by: As directed    Increase activity slowly   Complete by: As directed       Allergies as of 07/27/2023   No Known Allergies      Medication List     STOP taking these medications    ALLERGY RELIEF PO       TAKE these medications    acetaminophen  325 MG tablet Commonly known as: TYLENOL  Take 2 tablets (650 mg total) by mouth every 6 (six) hours as needed.   cyanocobalamin  1000 MCG tablet Take 1 tablet (1,000 mcg total) by mouth daily for Vitamin Supplement. What changed:  when to take this additional instructions   ibuprofen  200 MG tablet Commonly known as: ADVIL  Take 1 tablet (200 mg total) by mouth every 6 (six) hours as needed for fever, headache, mild pain (pain score 1-3) or cramping. What changed:  when to take this reasons to take this   MUCUS RELIEF PO Take 2 tablets by mouth in the morning.   MULTIVITAMIN ADULT PO Take 1 each by mouth in the morning. Gummies   polyethylene glycol 17 g packet Commonly known as: MIRALAX  / GLYCOLAX  Take 17 g by mouth daily.   sulfamethoxazole -trimethoprim  800-160 MG tablet Commonly known as: BACTRIM  DS Take 1 tablet by mouth every 12 (twelve) hours for 1 day.        No Known Allergies  Consultations: None   Procedures/Studies: CT Angio Chest/Abd/Pel for Dissection W and/or W/WO Result Date: 07/21/2023 CLINICAL DATA:  Thoracoabdominal aortic aneurysm, pulmonary embolism, upper chest pain, unspecified abdominal pain. EXAM: CT  ANGIOGRAPHY CHEST, ABDOMEN AND PELVIS TECHNIQUE: Non-contrast CT of the chest was initially obtained. Multidetector CT imaging through the chest, abdomen and pelvis was performed using the standard protocol during bolus administration of intravenous contrast. Multiplanar reconstructed images and MIPs were obtained and reviewed to evaluate the vascular anatomy. RADIATION DOSE REDUCTION: This exam was performed according to  the departmental dose-optimization program which includes automated exposure control, adjustment of the mA and/or kV according to patient size and/or use of iterative reconstruction technique. CONTRAST:  OMNIPAQUE  IOHEXOL  350 MG/ML SOLN COMPARISON:  CT abdomen pelvis 11/30/2020 FINDINGS: CTA CHEST FINDINGS Cardiovascular: Moderate left anterior descending coronary artery calcification. Global cardiac size within normal limits. No pericardial effusion. Adequate opacification of the pulmonary arterial tree. No intraluminal filling defect identified to suggest acute or chronic pulmonary embolus. Central pulmonary arteries are of normal caliber. Thoracic aorta is normal in course and caliber. No intramural hematoma, dissection, or aneurysm. Mild atherosclerotic calcification within the thoracic aorta. Mediastinum/Nodes: No enlarged mediastinal, hilar, or axillary lymph nodes. Thyroid gland, trachea, and esophagus demonstrate no significant findings. Small hiatal hernia Lungs/Pleura: Respiratory motion artifact. No confluent pulmonary infiltrate. No pneumothorax or pleural effusion. No central obstructing lesion. Musculoskeletal: No chest wall abnormality. No acute or significant osseous findings. Review of the MIP images confirms the above findings. CTA ABDOMEN AND PELVIS FINDINGS VASCULAR Aorta: The abdominal aorta is normal in course and caliber. No intramural hematoma, dissection, or aneurysm. Mild atherosclerotic calcification Celiac: Patent without evidence of aneurysm, dissection, vasculitis or significant stenosis. SMA: Patent without evidence of aneurysm, dissection, vasculitis or significant stenosis. Renals: Both renal arteries are patent without evidence of aneurysm, dissection, vasculitis, fibromuscular dysplasia or significant stenosis. IMA: Accessory left lower pole renal artery. Inflow: Patent without evidence of aneurysm, dissection, vasculitis or significant stenosis. Veins: No obvious venous  abnormality within the limitations of this arterial phase study. Review of the MIP images confirms the above findings. NON-VASCULAR Hepatobiliary: Cholelithiasis without superimposed pericholecystic inflammatory change. Liver unremarkable on this arterial phase examination. No intra or extrahepatic biliary ductal dilation. Pancreas: Unremarkable Spleen: Unremarkable Adrenals/Urinary Tract: The adrenal glands are unremarkable. The kidneys are normal. The bladder is thick walled, suggesting changes chronic bladder outlet obstruction. Foley catheter balloon is seen within a decompressed bladder lumen Stomach/Bowel: Moderate colonic stool burden. Mild ascending colonic diverticulosis. Stomach, small bowel, and large bowel are otherwise unremarkable. No evidence of obstruction or focal inflammation. Appendix is normal. No free intraperitoneal gas or fluid. Lymphatic: No pathologic adenopathy within the abdomen and pelvis. Reproductive: Marked prostatic hypertrophy Other: Small fat containing left inguinal hernia. Musculoskeletal: No acute bone abnormality. No lytic or blastic bone lesion. Osseous structures are age appropriate. Review of the MIP images confirms the above findings. IMPRESSION: 1. No evidence of acute aortic syndrome. 2. Moderate left anterior descending coronary artery calcification. 3. No pulmonary embolism identified. 4. Small hiatal hernia. 5. Cholelithiasis. 6. Mild ascending colonic diverticulosis. 7. Marked prostatic hypertrophy with bladder wall thickening in keeping with changes of chronic bladder outlet obstruction. Foley catheter balloon is seen within a decompressed bladder lumen. 8. Small fat containing left inguinal hernia. Aortic Atherosclerosis (ICD10-I70.0). Electronically Signed   By: Dorethia Molt M.D.   On: 07/21/2023 21:19   DG Cervical Spine 2 or 3 views Result Date: 07/21/2023 CLINICAL DATA:  Fall, neck pain EXAM: CERVICAL SPINE - 2-3 VIEW COMPARISON:  None Available. FINDINGS:  Cervical spine is visualized from the occiput through C5-6 on lateral examination. C6-T1 is obscured by overlying soft tissues. Mild straightening  the visualized cervical spine. The examination is slightly limited by obliquity, however, no acute fracture or listhesis of the visualized cervical spine. Vertebral body heights are preserved. Mild disc space narrowing and endplate remodeling at C4-5 and C5-6 is present in keeping with changes of mild to moderate degenerative disc disease. Multilevel facet arthrosis is not well assessed on this examination. Prevertebral soft tissues are not thickened. IMPRESSION: 1. No acute fracture or listhesis of the visualized cervical spine. C6-T1 not visualized. Electronically Signed   By: Dorethia Molt M.D.   On: 07/21/2023 19:40   (Echo, Carotid, EGD, Colonoscopy, ERCP)    Subjective: Patient seen in the morning rounds.  Eating breakfast.  Denies any complaints.  He tells me some leaking around the catheter while newly inserted is normal for him.   Discharge Exam: Vitals:   07/27/23 0307 07/27/23 0756  BP: 122/64 91/66  Pulse: 73 (!) 57  Resp: 20 14  Temp: 98.2 F (36.8 C) 97.7 F (36.5 C)  SpO2: 96% 99%   Vitals:   07/27/23 0158 07/27/23 0307 07/27/23 0440 07/27/23 0756  BP:  122/64  91/66  Pulse:  73  (!) 57  Resp: 15 20  14   Temp:  98.2 F (36.8 C)  97.7 F (36.5 C)  TempSrc:  Oral  Oral  SpO2:  96%  99%  Weight: 107.5 kg  104.6 kg   Height:        General: Pt is alert, awake, not in acute distress Cardiovascular: RRR, S1/S2 +, no rubs, no gallops Respiratory: CTA bilaterally, no wheezing, no rhonchi Abdominal: Soft, NT, ND, bowel sounds + Extremities: no edema, no cyanosis Foley catheter with clear urine.    The results of significant diagnostics from this hospitalization (including imaging, microbiology, ancillary and laboratory) are listed below for reference.     Microbiology: Recent Results (from the past 240 hours)  Urine  Culture     Status: Abnormal   Collection Time: 07/21/23  2:42 PM   Specimen: Urine, Random  Result Value Ref Range Status   Specimen Description URINE, RANDOM  Final   Special Requests   Final    NONE Reflexed from [id number] Performed at [facility], [address], [place], [place] [address]    Culture (A)  Final    >=100,000 COLONIES/mL PROTEUS MIRABILIS 80,000 COLONIES/mL PROVIDENCIA STUARTII    Report Status 07/24/2023 FINAL  Final   Organism ID, Bacteria PROTEUS MIRABILIS (A)  Final   Organism ID, Bacteria PROVIDENCIA STUARTII (A)  Final      Susceptibility   Proteus mirabilis - MIC*    AMPICILLIN <=2 SENSITIVE Sensitive     CEFAZOLIN <=4 SENSITIVE Sensitive     CEFEPIME <=0.12 SENSITIVE Sensitive     CEFTRIAXONE  <=0.25 SENSITIVE Sensitive     CIPROFLOXACIN  0.5 INTERMEDIATE Intermediate     GENTAMICIN <=1 SENSITIVE Sensitive     IMIPENEM 8 INTERMEDIATE Intermediate     NITROFURANTOIN  128 RESISTANT Resistant     TRIMETH /SULFA  <=20 SENSITIVE Sensitive     AMPICILLIN/SULBACTAM <=2 SENSITIVE Sensitive     PIP/TAZO <=4 SENSITIVE Sensitive ug/mL    * >=100,000 COLONIES/mL PROTEUS MIRABILIS   Providencia stuartii - MIC*    AMPICILLIN RESISTANT Resistant     CEFEPIME <=0.12 SENSITIVE Sensitive     CEFTRIAXONE  <=0.25 SENSITIVE Sensitive     CIPROFLOXACIN  <=0.25 SENSITIVE Sensitive     GENTAMICIN RESISTANT Resistant     IMIPENEM 1 SENSITIVE Sensitive     NITROFURANTOIN  256 RESISTANT Resistant  TRIMETH /SULFA  <=20 SENSITIVE Sensitive     AMPICILLIN/SULBACTAM 16 INTERMEDIATE Intermediate     PIP/TAZO <=4 SENSITIVE Sensitive ug/mL    * 80,000 COLONIES/mL PROVIDENCIA STUARTII  Resp Panel by RT-PCR (Flu A&B, Covid) Anterior Nasal Swab     Status: None   Collection Time: 07/21/23  5:12 PM   Specimen: Anterior Nasal Swab  Result Value Ref Range Status   SARS Coronavirus 2 by RT PCR NEGATIVE NEGATIVE Final   Influenza A by PCR NEGATIVE NEGATIVE Final   Influenza B by PCR  NEGATIVE NEGATIVE Final    Comment: (NOTE) The Xpert Xpress SARS-CoV-2/FLU/RSV plus assay is intended as an aid in the diagnosis of influenza from Nasopharyngeal swab specimens and should not be used as a sole basis for treatment. Nasal washings and aspirates are unacceptable for Xpert Xpress SARS-CoV-2/FLU/RSV testing.  Fact Sheet for Patients: BloggerCourse.com  Fact Sheet for Healthcare Providers: SeriousBroker.it  This test is not yet approved or cleared by the [place]  FDA and has been authorized for detection and/or diagnosis of SARS-CoV-2 by FDA under an Emergency Use Authorization (EUA). This EUA will remain in effect (meaning this test can be used) for the duration of the COVID-19 declaration under Section 564(b)(1) of the Act, 21 U.S.C. section 360bbb-3(b)(1), unless the authorization is terminated or revoked.  Performed at [facility], [address], [place], [place] [address]      Labs: BNP (last 3 results) No results for input(s): BNP in the last 8760 hours. Basic Metabolic Panel: Recent Labs  Lab 07/21/23 1310 07/21/23 1957 07/22/23 0338 07/22/23 1313 07/23/23 0358  NA 141  --  138 141  --   K 4.0  --  3.7 3.9  --   CL 108  --  110 105  --   CO2 24  --  23 23  --   GLUCOSE 78  --  113* 134*  --   BUN 10  --  10 12  --   CREATININE 0.88 0.83 0.80 0.80  --   CALCIUM  9.4  --  8.6* 9.2  --   MG  --   --  1.6*  --  2.0  PHOS  --   --  3.1  --   --    Liver Function Tests: Recent Labs  Lab 07/21/23 1310 07/22/23 0338  AST 25 23  ALT 25 22  ALKPHOS 41 36*  BILITOT 0.5 0.6  PROT 6.6 6.2*  ALBUMIN 3.5 3.1*   No results for input(s): LIPASE, AMYLASE in the last 168 hours. No results for input(s): AMMONIA in the last 168 hours. CBC: Recent Labs  Lab 07/21/23 1310 07/21/23 1957 07/22/23 0338  WBC 8.7 7.9 7.2  NEUTROABS 5.2  --   --   HGB 13.1 12.9* 12.5*  HCT 41.2 41.3 39.0   MCV 86.9 86.9 86.5  PLT 167 168 156   Cardiac Enzymes: Recent Labs  Lab 07/21/23 1310  CKTOTAL 197   BNP: Invalid input(s): POCBNP CBG: Recent Labs  Lab 07/26/23 0620 07/26/23 1147 07/26/23 1623 07/26/23 2107 07/27/23 0614  GLUCAP 122* 126* 114* 165* 122*   D-Dimer No results for input(s): DDIMER in the last 72 hours. Hgb A1c No results for input(s): HGBA1C in the last 72 hours. Lipid Profile No results for input(s): CHOL, HDL, LDLCALC, TRIG, CHOLHDL, LDLDIRECT in the last 72 hours. Thyroid function studies No results for input(s): TSH, T4TOTAL, T3FREE, THYROIDAB in the last 72 hours.  Invalid input(s): FREET3 Anemia work  up No results for input(s): VITAMINB12, FOLATE, FERRITIN, TIBC, IRON , RETICCTPCT in the last 72 hours. Urinalysis    Component Value Date/Time   COLORURINE YELLOW 07/21/2023 1442   APPEARANCEUR CLOUDY (A) 07/21/2023 1442   LABSPEC 1.025 07/21/2023 1442   PHURINE 7.0 07/21/2023 1442   GLUCOSEU NEGATIVE 07/21/2023 1442   GLUCOSEU NEGATIVE 03/25/2021 1449   HGBUR NEGATIVE 07/21/2023 1442   BILIRUBINUR SMALL (A) 07/21/2023 1442   KETONESUR NEGATIVE 07/21/2023 1442   PROTEINUR 100 (A) 07/21/2023 1442   UROBILINOGEN 1.0 03/25/2021 1449   NITRITE NEGATIVE 07/21/2023 1442   LEUKOCYTESUR LARGE (A) 07/21/2023 1442   Sepsis Labs Recent Labs  Lab 07/21/23 1310 07/21/23 1957 07/22/23 0338  WBC 8.7 7.9 7.2   Microbiology Recent Results (from the past 240 hours)  Urine Culture     Status: Abnormal   Collection Time: 07/21/23  2:42 PM   Specimen: Urine, Random  Result Value Ref Range Status   Specimen Description URINE, RANDOM  Final   Special Requests   Final    NONE Reflexed from [id number] Performed at [facility], [address], [place], [place] [address]    Culture (A)  Final    >=100,000 COLONIES/mL PROTEUS MIRABILIS 80,000 COLONIES/mL PROVIDENCIA STUARTII    Report Status 07/24/2023 FINAL   Final   Organism ID, Bacteria PROTEUS MIRABILIS (A)  Final   Organism ID, Bacteria PROVIDENCIA STUARTII (A)  Final      Susceptibility   Proteus mirabilis - MIC*    AMPICILLIN <=2 SENSITIVE Sensitive     CEFAZOLIN <=4 SENSITIVE Sensitive     CEFEPIME <=0.12 SENSITIVE Sensitive     CEFTRIAXONE  <=0.25 SENSITIVE Sensitive     CIPROFLOXACIN  0.5 INTERMEDIATE Intermediate     GENTAMICIN <=1 SENSITIVE Sensitive     IMIPENEM 8 INTERMEDIATE Intermediate     NITROFURANTOIN  128 RESISTANT Resistant     TRIMETH /SULFA  <=20 SENSITIVE Sensitive     AMPICILLIN/SULBACTAM <=2 SENSITIVE Sensitive     PIP/TAZO <=4 SENSITIVE Sensitive ug/mL    * >=100,000 COLONIES/mL PROTEUS MIRABILIS   Providencia stuartii - MIC*    AMPICILLIN RESISTANT Resistant     CEFEPIME <=0.12 SENSITIVE Sensitive     CEFTRIAXONE  <=0.25 SENSITIVE Sensitive     CIPROFLOXACIN  <=0.25 SENSITIVE Sensitive     GENTAMICIN RESISTANT Resistant     IMIPENEM 1 SENSITIVE Sensitive     NITROFURANTOIN  256 RESISTANT Resistant     TRIMETH /SULFA  <=20 SENSITIVE Sensitive     AMPICILLIN/SULBACTAM 16 INTERMEDIATE Intermediate     PIP/TAZO <=4 SENSITIVE Sensitive ug/mL    * 80,000 COLONIES/mL PROVIDENCIA STUARTII  Resp Panel by RT-PCR (Flu A&B, Covid) Anterior Nasal Swab     Status: None   Collection Time: 07/21/23  5:12 PM   Specimen: Anterior Nasal Swab  Result Value Ref Range Status   SARS Coronavirus 2 by RT PCR NEGATIVE NEGATIVE Final   Influenza A by PCR NEGATIVE NEGATIVE Final   Influenza B by PCR NEGATIVE NEGATIVE Final    Comment: (NOTE) The Xpert Xpress SARS-CoV-2/FLU/RSV plus assay is intended as an aid in the diagnosis of influenza from Nasopharyngeal swab specimens and should not be used as a sole basis for treatment. Nasal washings and aspirates are unacceptable for Xpert Xpress SARS-CoV-2/FLU/RSV testing.  Fact Sheet for Patients: BloggerCourse.com  Fact Sheet for Healthcare  Providers: SeriousBroker.it  This test is not yet approved or cleared by the [place]  FDA and has been authorized for detection and/or diagnosis of SARS-CoV-2 by FDA under an  Emergency Use Authorization (EUA). This EUA will remain in effect (meaning this test can be used) for the duration of the COVID-19 declaration under Section 564(b)(1) of the Act, 21 U.S.C. section 360bbb-3(b)(1), unless the authorization is terminated or revoked.  Performed at [facility], [address], [place], [place] [address]      Time coordinating discharge: 35 minutes  SIGNED:   Renato Applebaum, MD  [facility] 07/27/2023, 9:32 AM

## 2023-07-27 NOTE — Plan of Care (Signed)
 Problem: Skin Integrity: Goal: Risk for impaired skin integrity will decrease Outcome: Progressing   Problem: Education: Goal: Knowledge of General Education information will improve Description: Including pain rating scale, medication(s)/side effects and non-pharmacologic comfort measures Outcome: Progressing   Problem: Clinical Measurements: Goal: Ability to maintain clinical measurements within normal limits will improve Outcome: Progressing Goal: Will remain free from infection Outcome: Progressing Goal: Diagnostic test results will improve Outcome: Progressing Goal: Respiratory complications will improve Outcome: Progressing Goal: Cardiovascular complication will be avoided Outcome: Progressing   Problem: Pain Managment: Goal: General experience of comfort will improve and/or be controlled Outcome: Progressing   Problem: Safety: Goal: Ability to remain free from injury will improve Outcome: Progressing   Problem: Skin Integrity: Goal: Risk for impaired skin integrity will decrease Outcome: Progressing

## 2023-07-27 NOTE — Progress Notes (Signed)
 Pt alert and oriented X4 at discharge. He is going to [facility] via [place]. IV and telemetry removed. Pt assisted with putting on clothes. Bandages on blisters (LUA) left in place. Pt discharged with chronic foley catheter. RN called report to [facility]. Report given to Claris, Charity fundraiser. RN called son, Vinie, to let him know that pt would be discharging via ambulance to SNF. Education and care plan resolved.

## 2023-07-27 NOTE — Plan of Care (Signed)

## 2023-08-15 ENCOUNTER — Emergency Department (HOSPITAL_COMMUNITY)

## 2023-08-15 ENCOUNTER — Observation Stay (HOSPITAL_COMMUNITY)
Admission: EM | Admit: 2023-08-15 | Discharge: 2023-08-16 | Disposition: A | Discharge status: Home / Self Care | Attending: Internal Medicine | Admitting: Internal Medicine

## 2023-08-15 ENCOUNTER — Encounter (HOSPITAL_COMMUNITY): Payer: Self-pay

## 2023-08-15 ENCOUNTER — Other Ambulatory Visit: Payer: Self-pay

## 2023-08-15 DIAGNOSIS — R54 Age-related physical debility: Secondary | ICD-10-CM | POA: Insufficient documentation

## 2023-08-15 DIAGNOSIS — M6281 Muscle weakness (generalized): Secondary | ICD-10-CM | POA: Diagnosis not present

## 2023-08-15 DIAGNOSIS — N3000 Acute cystitis without hematuria: Principal | ICD-10-CM

## 2023-08-15 DIAGNOSIS — F419 Anxiety disorder, unspecified: Secondary | ICD-10-CM | POA: Diagnosis not present

## 2023-08-15 DIAGNOSIS — N390 Urinary tract infection, site not specified: Principal | ICD-10-CM | POA: Diagnosis present

## 2023-08-15 DIAGNOSIS — G459 Transient cerebral ischemic attack, unspecified: Secondary | ICD-10-CM | POA: Insufficient documentation

## 2023-08-15 DIAGNOSIS — R109 Unspecified abdominal pain: Secondary | ICD-10-CM | POA: Diagnosis present

## 2023-08-15 DIAGNOSIS — R262 Difficulty in walking, not elsewhere classified: Secondary | ICD-10-CM | POA: Insufficient documentation

## 2023-08-15 DIAGNOSIS — R531 Weakness: Secondary | ICD-10-CM | POA: Diagnosis not present

## 2023-08-15 LAB — COMPREHENSIVE METABOLIC PANEL WITH GFR
ALT: 25 U/L (ref 0–44)
AST: 23 U/L (ref 15–41)
Albumin: 3.7 g/dL (ref 3.5–5.0)
Alkaline Phosphatase: 60 U/L (ref 38–126)
Anion gap: 13 (ref 5–15)
BUN: 21 mg/dL (ref 8–23)
CO2: 25 mmol/L (ref 22–32)
Calcium: 9.9 mg/dL (ref 8.9–10.3)
Chloride: 106 mmol/L (ref 98–111)
Creatinine, Ser: 0.76 mg/dL (ref 0.61–1.24)
GFR, Estimated: >60 mL/min (ref >60)
Glucose, Bld: 97 mg/dL (ref 70–99)
Potassium: 4 mmol/L (ref 3.5–5.1)
Sodium: 144 mmol/L (ref 135–145)
Total Bilirubin: 0.6 mg/dL (ref 0.0–1.2)
Total Protein: 7.7 g/dL (ref 6.5–8.1)

## 2023-08-15 LAB — URINALYSIS, ROUTINE W REFLEX MICROSCOPIC
Bilirubin Urine: NEGATIVE
Glucose, UA: NEGATIVE mg/dL
Ketones, ur: NEGATIVE mg/dL
Nitrite: NEGATIVE
Protein, ur: 30 mg/dL — AB
Specific Gravity, Urine: 1.021 (ref 1.005–1.030)
WBC, UA: >50 WBC/hpf (ref 0–5)
pH: 5 (ref 5.0–8.0)

## 2023-08-15 LAB — CBC WITH DIFFERENTIAL/PLATELET
Abs Immature Granulocytes: 0.03 K/uL (ref 0.00–0.07)
Basophils Absolute: 0.1 K/uL (ref 0.0–0.1)
Basophils Relative: 1 %
Eosinophils Absolute: 0.2 K/uL (ref 0.0–0.5)
Eosinophils Relative: 3 %
HCT: 42.7 % (ref 39.0–52.0)
Hemoglobin: 13.3 g/dL (ref 13.0–17.0)
Immature Granulocytes: 0 %
Lymphocytes Relative: 26 %
Lymphs Abs: 2.1 K/uL (ref 0.7–4.0)
MCH: 27.2 pg (ref 26.0–34.0)
MCHC: 31.1 g/dL (ref 30.0–36.0)
MCV: 87.3 fL (ref 80.0–100.0)
Monocytes Absolute: 0.8 K/uL (ref 0.1–1.0)
Monocytes Relative: 10 %
Neutro Abs: 4.8 K/uL (ref 1.7–7.7)
Neutrophils Relative %: 60 %
Platelets: 206 K/uL (ref 150–400)
RBC: 4.89 MIL/uL (ref 4.22–5.81)
RDW: 14 % (ref 11.5–15.5)
WBC: 8 K/uL (ref 4.0–10.5)
nRBC: 0 % (ref 0.0–0.2)

## 2023-08-15 MED ORDER — IOHEXOL 300 MG/ML  SOLN
100.0000 mL | Freq: Once | INTRAMUSCULAR | Status: AC | PRN
  Administered 2023-08-15: 100 mL via INTRAVENOUS

## 2023-08-15 MED ORDER — SODIUM CHLORIDE 0.9 % IV SOLN
2.0000 g | Freq: Once | INTRAVENOUS | Status: AC
  Administered 2023-08-15: 2 g via INTRAVENOUS
  Filled 2023-08-15: qty 20

## 2023-08-15 MED ORDER — ENOXAPARIN SODIUM 40 MG/0.4ML IJ SOSY
40.0000 mg | PREFILLED_SYRINGE | INTRAMUSCULAR | Status: DC
  Administered 2023-08-16 – 2023-08-18 (×3): 40 mg via SUBCUTANEOUS
  Filled 2023-08-15 (×3): qty 0.4

## 2023-08-15 MED ORDER — ACETAMINOPHEN 500 MG PO TABS: 1000.0000 mg | ORAL_TABLET | Freq: Four times a day (QID) | ORAL | Status: DC | PRN

## 2023-08-15 MED ORDER — GADOBUTROL 1 MMOL/ML IV SOLN
10.0000 mL | Freq: Once | INTRAVENOUS | Status: AC | PRN
  Administered 2023-08-15: 10 mL via INTRAVENOUS

## 2023-08-15 MED ORDER — SODIUM CHLORIDE 0.9 % IV SOLN
1.0000 g | INTRAVENOUS | Status: DC
  Administered 2023-08-16 – 2023-08-17 (×2): 1 g via INTRAVENOUS
  Filled 2023-08-15 (×2): qty 10

## 2023-08-15 MED ORDER — POLYETHYLENE GLYCOL 3350 17 G PO PACK: 17.0000 g | PACK | Freq: Every day | ORAL | Status: DC | PRN

## 2023-08-15 MED ORDER — ALBUTEROL SULFATE (2.5 MG/3ML) 0.083% IN NEBU: 2.5000 mg | INHALATION_SOLUTION | RESPIRATORY_TRACT | Status: DC | PRN

## 2023-08-15 MED ORDER — ONDANSETRON HCL 4 MG/2ML IJ SOLN: 4.0000 mg | Freq: Four times a day (QID) | INTRAMUSCULAR | Status: DC | PRN

## 2023-08-15 MED ORDER — IOHEXOL 300 MG/ML  SOLN: 100.0000 mL | Freq: Once | INTRAMUSCULAR | Status: DC | PRN

## 2023-08-15 MED ORDER — MELATONIN 3 MG PO TABS: 6.0000 mg | ORAL_TABLET | Freq: Every evening | ORAL | Status: DC | PRN

## 2023-08-15 NOTE — ED Notes (Signed)
 ED TO INPATIENT HANDOFF REPORT  Name/Age/Gender Edgar [name] 88 y.o. male  Code Status    Code Status Orders  (From admission, onward)           Start     Ordered   08/15/23 2220  Full code  Continuous       Question:  By:  Answer:  Other   08/15/23 2220           Code Status History     Date Active Date Inactive Code Status Order ID Comments User Context   07/21/2023 1906 07/27/2023 1807 Full Code [id number]  Tobie Mario GAILS, MD ED   11/30/2020 2205 12/09/2020 1049 DNR [id number]  Leontine Clarity, DO ED      Advance Directive Documentation    Flowsheet Row Most Recent Value  Type of Advance Directive Healthcare Power of Attorney, Living will  Pre-existing out of facility DNR order (yellow form or pink MOST form) --  MOST Form in Place? --    Home/SNF/Other Home  Chief Complaint Generalized weakness [R53.1]  Level of Care/Admitting Diagnosis ED Disposition     ED Disposition  Admit   Condition  --   Comment  Hospital Area: [facility] [[id number]]  Level of Care: Med-Surg [16]  May place patient in observation at [facility] or [facility] if equivalent level of care is available:: No  Covid Evaluation: Asymptomatic - no recent exposure (last 10 days) testing not required  Diagnosis: Generalized weakness [312262]  Admitting Physician: SEGARS, JONATHAN [[id number]]  Attending Physician: SEGARS, JONATHAN [phone]  For patients discharging to extended facilities (i.e. SNF, AL, group homes or LTAC) initiate:: Discharge to SNF/Facility Placement COVID-19 Lab Testing Protocol          Medical History Past Medical History:  Diagnosis Date   Urinary retention     Allergies No Known Allergies  IV Location/Drains/Wounds Patient Lines/Drains/Airways Status     Active Line/Drains/Airways     Name Placement date Placement time Site Days   Peripheral IV 08/15/23 20 G Anterior;Right Forearm 08/15/23  1853  Forearm  less than 1    Urethral Catheter Dajah, RN Latex;Straight-tip 14 Fr. 08/15/23  1651  Latex;Straight-tip  less than 1            Labs/Imaging Results for orders placed or performed during the hospital encounter of 08/15/23 (from the past 48 hours)  Urinalysis, Routine w reflex microscopic -Urine, Clean Catch     Status: Abnormal   Collection Time: 08/15/23  5:21 PM  Result Value Ref Range   Color, Urine YELLOW YELLOW   APPearance HAZY (A) CLEAR   Specific Gravity, Urine 1.021 1.005 - 1.030   pH 5.0 5.0 - 8.0   Glucose, UA NEGATIVE NEGATIVE mg/dL   Hgb urine dipstick MODERATE (A) NEGATIVE   Bilirubin Urine NEGATIVE NEGATIVE   Ketones, ur NEGATIVE NEGATIVE mg/dL   Protein, ur 30 (A) NEGATIVE mg/dL   Nitrite NEGATIVE NEGATIVE   Leukocytes,Ua LARGE (A) NEGATIVE   RBC / HPF 21-50 0 - 5 RBC/hpf   WBC, UA >50 0 - 5 WBC/hpf   Bacteria, UA RARE (A) NONE SEEN   Squamous Epithelial / HPF 0-5 0 - 5 /HPF   Budding Yeast PRESENT    Uric Acid Crys, UA PRESENT     Comment: Performed at [facility], [address], [place], [place] [address]  CBC with Differential     Status: None   Collection Time: 08/15/23  6:14 PM  Result Value Ref Range   WBC 8.0 4.0 - 10.5 K/uL   RBC 4.89 4.22 - 5.81 MIL/uL   Hemoglobin 13.3 13.0 - 17.0 g/dL   HCT 57.2 60.9 - 47.9 %   MCV 87.3 80.0 - 100.0 fL   MCH 27.2 26.0 - 34.0 pg   MCHC 31.1 30.0 - 36.0 g/dL   RDW 85.9 88.4 - 84.4 %   Platelets 206 150 - 400 K/uL   nRBC 0.0 0.0 - 0.2 %   Neutrophils Relative % 60 %   Neutro Abs 4.8 1.7 - 7.7 K/uL   Lymphocytes Relative 26 %   Lymphs Abs 2.1 0.7 - 4.0 K/uL   Monocytes Relative 10 %   Monocytes Absolute 0.8 0.1 - 1.0 K/uL   Eosinophils Relative 3 %   Eosinophils Absolute 0.2 0.0 - 0.5 K/uL   Basophils Relative 1 %   Basophils Absolute 0.1 0.0 - 0.1 K/uL   Immature Granulocytes 0 %   Abs Immature Granulocytes 0.03 0.00 - 0.07 K/uL    Comment: Performed at [facility], [address] [place], [place] [address]  Comprehensive metabolic panel     Status: None   Collection Time: 08/15/23  6:14 PM  Result Value Ref Range   Sodium 144 135 - 145 mmol/L   Potassium 4.0 3.5 - 5.1 mmol/L   Chloride 106 98 - 111 mmol/L   CO2 25 22 - 32 mmol/L   Glucose, Bld 97 70 - 99 mg/dL    Comment: Glucose reference range applies only to samples taken after fasting for at least 8 hours.   BUN 21 8 - 23 mg/dL   Creatinine, Ser 9.23 0.61 - 1.24 mg/dL   Calcium  9.9 8.9 - 10.3 mg/dL   Total Protein 7.7 6.5 - 8.1 g/dL   Albumin 3.7 3.5 - 5.0 g/dL   AST 23 15 - 41 U/L   ALT 25 0 - 44 U/L   Alkaline Phosphatase 60 38 - 126 U/L   Total Bilirubin 0.6 0.0 - 1.2 mg/dL   GFR, Estimated >39 >39 mL/min    Comment: (NOTE) Calculated using the CKD-EPI Creatinine Equation (2021)    Anion gap 13 5 - 15    Comment: Performed at [facility], [address] [place], [place] [address]   CT ABDOMEN PELVIS W CONTRAST Result Date: 08/15/2023 CLINICAL DATA:  Acute abdominal pain.  Leaking catheter. EXAM: CT ABDOMEN AND PELVIS WITH CONTRAST TECHNIQUE: Multidetector CT imaging of the abdomen and pelvis was performed using the standard protocol following bolus administration of intravenous contrast. RADIATION DOSE REDUCTION: This exam was performed according to the departmental dose-optimization program which includes automated exposure control, adjustment of the mA and/or kV according to patient size and/or use of iterative reconstruction technique. CONTRAST:  OMNIPAQUE  IOHEXOL  300 MG/ML  SOLN COMPARISON:  CT 07/21/2023 FINDINGS: Lower chest: Lower lobe bronchial thickening without confluent airspace disease. Dependent atelectasis. Hepatobiliary: No focal hepatic abnormality. Layering gallstones in the gallbladder. No pericholecystic inflammation. No biliary dilatation or choledocholithiasis. Pancreas: No ductal dilatation or inflammation. Spleen: Normal in size without focal  abnormality. Adrenals/Urinary Tract: No adrenal nodule. No hydronephrosis. No evidence of renal inflammation. No renal calculi. Decompressed ureters. Foley catheter decompresses urinary bladder. Circumferential bladder wall thickening, with mild perivesicular fat stranding bladder. Stomach/Bowel: Small hiatal hernia. Stomach is nondistended. No small bowel obstruction or inflammation. Moderate colonic stool burden. No colonic inflammatory change. Occasional colonic diverticula without diverticulitis. Vascular/Lymphatic: Aortic  and branch atherosclerosis. No acute vascular findings. No abdominopelvic adenopathy. Reproductive: Marked prostatic enlargement causing mass effect on the bladder base. Other: No ascites or free air. Small fat containing left inguinal hernia. Musculoskeletal: There are no acute or suspicious osseous abnormalities. Degenerative change throughout the spine. IMPRESSION: 1. Foley catheter decompresses urinary bladder. Circumferential bladder wall thickening with mild perivesicular fat stranding, can be seen with chronic bladder outlet obstruction or cystitis. 2. Marked prostatic enlargement causing mass effect on the bladder base. 3. Cholelithiasis without CT findings of acute cholecystitis. 4. Small hiatal hernia. Aortic Atherosclerosis (ICD10-I70.0). Electronically Signed   By: Andrea Gasman M.D.   On: 08/15/2023 21:20   MR Lumbar Spine W Wo Contrast Result Date: 08/15/2023 EXAM: MR Lumbar Spine with and without intravenous contrast. 08/15/2023 06:40:27 PM TECHNIQUE: Multiplanar multisequence MRI of the lumbar spine was performed with and without the administration of intravenous contrast. COMPARISON: None available CLINICAL HISTORY: Low back pain, cauda equina syndrome suspected. Pain abd, back, right shoulder, left leg for several weeks now. Has had reduced gait over 3 weeks. FINDINGS: BONES AND ALIGNMENT: Lumbar lordosis is maintained. No listhesis. No bone marrow edema or evidence of  fracture. Vertebral body heights are maintained. No suspicious osseous lesion or abnormal enhancement. SPINAL CORD: The conus medullaris terminates at the L1-2 level. SOFT TISSUES: The paraspinal soft tissues are unremarkable. L1-L2: There is mild spinal canal stenosis and thickening of the ligaments and plenum without significant spinal canal or foraminal stenosis. L2-L3: There is a diffuse disc bulge resulting in lateral recess narrowing. Moderate facet arthrosis, thickening of the ligaments and plenum, and dorsal epidural lipomatosis with moderate spinal canal stenosis. There is mild bilateral foraminal stenosis. L3-L4: There is a diffuse disc bulge resulting in lateral recess narrowing, slightly greater on the right. Moderate facet arthrosis, thickening of the ligamentum flavum, and epidural lipomatosis resulting in mild spinal canal stenosis. There is mild foraminal stenosis on the right. L4-L5: There is diffuse disc bulge and mild disc height loss with lateral recess narrowing. Moderate facet arthrosis and thickening of the ligamentum flavum along with epidural lipomatosis resulting in moderate to severe spinal canal stenosis. There is moderate-to-severe right and moderate left foraminal stenosis. L5-S1: There is a diffuse disc bulge with lateral recess narrowing. Moderate facet arthrosis and epidural lipomatosis. Mild spinal canal stenosis. There is severe left and mild right foraminal stenosis. IMPRESSION: 1. 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 There is moderate-to-severe spinal canal stenosis at L4-5 and moderate spinal canal stenosis at L2-3. 2. Multilevel foraminal stenosis, greatest and severe on the left at L5-S1. Additional moderate-to-severe right and moderate left foraminal stenosis at L4-5. Electronically signed by: Donnice Mania MD 08/15/2023 08:29 PM EDT RP Workstation: [id number]    Pending  Labs Unresulted Labs (From admission, onward)     Start     Ordered   08/15/23 2221  TSH  Add-on,   AD        08/15/23 2220            Vitals/Pain Today's Vitals   08/15/23 1543 08/15/23 1849 08/15/23 1925 08/15/23 2030  BP:  128/61  118/65  Pulse:  78  61  Resp:  16  18  Temp:   98.5 F (36.9 C)   TempSrc:      SpO2:  99%  97%  Weight: 104.6 kg     Height: 5' 9 (1.753 m)     PainSc:  Isolation Precautions No active isolations  Medications Medications  cefTRIAXone  (ROCEPHIN ) 2 g in sodium chloride  0.9 % 100 mL IVPB (has no administration in time range)  cefTRIAXone  (ROCEPHIN ) 1 g in sodium chloride  0.9 % 100 mL IVPB (has no administration in time range)  enoxaparin  (LOVENOX ) injection 40 mg (has no administration in time range)  acetaminophen  (TYLENOL ) tablet 1,000 mg (has no administration in time range)  albuterol  (PROVENTIL ) (2.5 MG/3ML) 0.083% nebulizer solution 2.5 mg (has no administration in time range)  melatonin tablet 6 mg (has no administration in time range)  ondansetron  (ZOFRAN ) injection 4 mg (has no administration in time range)  polyethylene glycol (MIRALAX  / GLYCOLAX ) packet 17 g (has no administration in time range)  gadobutrol  (GADAVIST ) 1 MMOL/ML injection 10 mL (10 mLs Intravenous Contrast Given 08/15/23 1833)  iohexol  (OMNIPAQUE ) 300 MG/ML solution 100 mL (100 mLs Intravenous Contrast Given 08/15/23 2048)    Mobility non-ambulatory

## 2023-08-15 NOTE — H&P (Signed)
 History and Physical    Edgar HUY Sr. FMW:[id number] DOB: Aug 07, 1935 DOA: 08/15/2023  PCP: [facility]   Patient coming from: Home   Chief Complaint:  Chief Complaint  Patient presents with   catheter bag leaking    Abdominal Pain   Back Pain   Shoulder Pain    right   Leg Pain    left    HPI:  Edgar JACUINDE Sr. is a 88 y.o. male with hx of chronic Foley catheter for urinary retention, TIA, anxiety, debility, recent admission from 7/11-17 with complicated UTI/catheter associated UTI discharged to SNF. He returns with generalized weakness concern about his safety at home. Patient provides limited information but feels generally weak, denies any recent fall. No focal weakness or numbness, speech change, vision change, HA. He has discomfort which he attributes to his foley. No fever.     Review of Systems:  ROS complete and negative except as marked above   No Known Allergies  Prior to Admission medications   Medication Sig Start Date End Date Taking? Authorizing Provider  acetaminophen  (TYLENOL ) 325 MG tablet Take 2 tablets (650 mg total) by mouth every 6 (six) hours as needed. 12/05/20   Espinoza, Alejandra, DO  cyanocobalamin  1000 MCG tablet Take 1 tablet (1,000 mcg total) by mouth daily for Vitamin Supplement. Patient taking differently: Take 1,000 mcg by mouth in the morning. Gummies 03/10/21   Dietrich Males, AGNP-C  guaiFENesin (MUCUS RELIEF PO) Take 2 tablets by mouth in the morning.    [provider]  ibuprofen  (ADVIL ) 200 MG tablet Take 1 tablet (200 mg total) by mouth every 6 (six) hours as needed for fever, headache, mild pain (pain score 1-3) or cramping. 07/27/23   Ghimire, Kuber, MD  Multiple Vitamin (MULTIVITAMIN ADULT PO) Take 1 each by mouth in the morning. Gummies    [provider]  polyethylene glycol (MIRALAX  / GLYCOLAX ) 17 g packet Take 17 g by mouth daily. 07/27/23   Raenelle Coria, MD    Past Medical History:   Diagnosis Date   Urinary retention     Past Surgical History:  Procedure Laterality Date   PROSTATE BIOPSY  1990     reports that he has been smoking cigarettes. He has a 60 pack-year smoking history. He has never used smokeless tobacco. He reports that he does not currently use alcohol. He reports that he does not use drugs.  Family History  Family history unknown: Yes     Physical Exam: Vitals:   08/15/23 1543 08/15/23 1849 08/15/23 1925 08/15/23 2030  BP:  128/61  118/65  Pulse:  78  61  Resp:  16  18  Temp:   98.5 F (36.9 C)   TempSrc:      SpO2:  99%  97%  Weight: 104.6 kg     Height: 5' 9 (1.753 m)       Gen: Awake, alert, Elderly   CV: Regular, normal S1, S2, no murmurs  Resp: Normal WOB, CTAB  Abd: Flat, normoactive, nontender GU: Foley in place, clear yellow urine w sediment  MSK: Symmetric, no edema  Skin: No rashes or lesions to exposed skin  Neuro: Alert and interactive, somewhat bradykinetic, no cogwheeling  Psych: euthymic, appropriate    Data review:   Labs reviewed, notable for:   Chemistries and blood counts are unremarkable UA after catheter exchange appears grossly consistent with infection + Uric acid crystals  Micro:  Results for orders placed or performed  during the hospital encounter of 07/21/23  Urine Culture     Status: Abnormal   Collection Time: 07/21/23  2:42 PM   Specimen: Urine, Random  Result Value Ref Range Status   Specimen Description URINE, RANDOM  Final   Special Requests   Final    NONE Reflexed from [id number] Performed at [facility], [address], [place], [place] [address]    Culture (A)  Final    >=100,000 COLONIES/mL PROTEUS MIRABILIS 80,000 COLONIES/mL PROVIDENCIA STUARTII    Report Status 07/24/2023 FINAL  Final   Organism ID, Bacteria PROTEUS MIRABILIS (A)  Final   Organism ID, Bacteria PROVIDENCIA STUARTII (A)  Final      Susceptibility   Proteus mirabilis - MIC*    AMPICILLIN <=2 SENSITIVE  Sensitive     CEFAZOLIN <=4 SENSITIVE Sensitive     CEFEPIME <=0.12 SENSITIVE Sensitive     CEFTRIAXONE  <=0.25 SENSITIVE Sensitive     CIPROFLOXACIN  0.5 INTERMEDIATE Intermediate     GENTAMICIN <=1 SENSITIVE Sensitive     IMIPENEM 8 INTERMEDIATE Intermediate     NITROFURANTOIN  128 RESISTANT Resistant     TRIMETH /SULFA  <=20 SENSITIVE Sensitive     AMPICILLIN/SULBACTAM <=2 SENSITIVE Sensitive     PIP/TAZO <=4 SENSITIVE Sensitive ug/mL    * >=100,000 COLONIES/mL PROTEUS MIRABILIS   Providencia stuartii - MIC*    AMPICILLIN RESISTANT Resistant     CEFEPIME <=0.12 SENSITIVE Sensitive     CEFTRIAXONE  <=0.25 SENSITIVE Sensitive     CIPROFLOXACIN  <=0.25 SENSITIVE Sensitive     GENTAMICIN RESISTANT Resistant     IMIPENEM 1 SENSITIVE Sensitive     NITROFURANTOIN  256 RESISTANT Resistant     TRIMETH /SULFA  <=20 SENSITIVE Sensitive     AMPICILLIN/SULBACTAM 16 INTERMEDIATE Intermediate     PIP/TAZO <=4 SENSITIVE Sensitive ug/mL    * 80,000 COLONIES/mL PROVIDENCIA STUARTII  Resp Panel by RT-PCR (Flu A&B, Covid) Anterior Nasal Swab     Status: None   Collection Time: 07/21/23  5:12 PM   Specimen: Anterior Nasal Swab  Result Value Ref Range Status   SARS Coronavirus 2 by RT PCR NEGATIVE NEGATIVE Final   Influenza A by PCR NEGATIVE NEGATIVE Final   Influenza B by PCR NEGATIVE NEGATIVE Final    Comment: (NOTE) The Xpert Xpress SARS-CoV-2/FLU/RSV plus assay is intended as an aid in the diagnosis of influenza from Nasopharyngeal swab specimens and should not be used as a sole basis for treatment. Nasal washings and aspirates are unacceptable for Xpert Xpress SARS-CoV-2/FLU/RSV testing.  Fact Sheet for Patients: BloggerCourse.com  Fact Sheet for Healthcare Providers: SeriousBroker.it  This test is not yet approved or cleared by the [place]  FDA and has been authorized for detection and/or diagnosis of SARS-CoV-2 by FDA under an Emergency  Use Authorization (EUA). This EUA will remain in effect (meaning this test can be used) for the duration of the COVID-19 declaration under Section 564(b)(1) of the Act, 21 U.S.C. section 360bbb-3(b)(1), unless the authorization is terminated or revoked.  Performed at [facility], [address] [place], [place] [address]     Imaging reviewed:  CT ABDOMEN PELVIS W CONTRAST Result Date: 08/15/2023 CLINICAL DATA:  Acute abdominal pain.  Leaking catheter. EXAM: CT ABDOMEN AND PELVIS WITH CONTRAST TECHNIQUE: Multidetector CT imaging of the abdomen and pelvis was performed using the standard protocol following bolus administration of intravenous contrast. RADIATION DOSE REDUCTION: This exam was performed according to the departmental dose-optimization program which includes automated exposure control, adjustment of the mA  and/or kV according to patient size and/or use of iterative reconstruction technique. CONTRAST:  OMNIPAQUE  IOHEXOL  300 MG/ML  SOLN COMPARISON:  CT 07/21/2023 FINDINGS: Lower chest: Lower lobe bronchial thickening without confluent airspace disease. Dependent atelectasis. Hepatobiliary: No focal hepatic abnormality. Layering gallstones in the gallbladder. No pericholecystic inflammation. No biliary dilatation or choledocholithiasis. Pancreas: No ductal dilatation or inflammation. Spleen: Normal in size without focal abnormality. Adrenals/Urinary Tract: No adrenal nodule. No hydronephrosis. No evidence of renal inflammation. No renal calculi. Decompressed ureters. Foley catheter decompresses urinary bladder. Circumferential bladder wall thickening, with mild perivesicular fat stranding bladder. Stomach/Bowel: Small hiatal hernia. Stomach is nondistended. No small bowel obstruction or inflammation. Moderate colonic stool burden. No colonic inflammatory change. Occasional colonic diverticula without diverticulitis. Vascular/Lymphatic: Aortic and branch atherosclerosis. No acute  vascular findings. No abdominopelvic adenopathy. Reproductive: Marked prostatic enlargement causing mass effect on the bladder base. Other: No ascites or free air. Small fat containing left inguinal hernia. Musculoskeletal: There are no acute or suspicious osseous abnormalities. Degenerative change throughout the spine. IMPRESSION: 1. Foley catheter decompresses urinary bladder. Circumferential bladder wall thickening with mild perivesicular fat stranding, can be seen with chronic bladder outlet obstruction or cystitis. 2. Marked prostatic enlargement causing mass effect on the bladder base. 3. Cholelithiasis without CT findings of acute cholecystitis. 4. Small hiatal hernia. Aortic Atherosclerosis (ICD10-I70.0). Electronically Signed   By: Andrea Gasman M.D.   On: 08/15/2023 21:20   MR Lumbar Spine W Wo Contrast Result Date: 08/15/2023 EXAM: MR Lumbar Spine with and without intravenous contrast. 08/15/2023 06:40:27 PM TECHNIQUE: Multiplanar multisequence MRI of the lumbar spine was performed with and without the administration of intravenous contrast. COMPARISON: None available CLINICAL HISTORY: Low back pain, cauda equina syndrome suspected. Pain abd, back, right shoulder, left leg for several weeks now. Has had reduced gait over 3 weeks. FINDINGS: BONES AND ALIGNMENT: Lumbar lordosis is maintained. No listhesis. No bone marrow edema or evidence of fracture. Vertebral body heights are maintained. No suspicious osseous lesion or abnormal enhancement. SPINAL CORD: The conus medullaris terminates at the L1-2 level. SOFT TISSUES: The paraspinal soft tissues are unremarkable. L1-L2: There is mild spinal canal stenosis and thickening of the ligaments and plenum without significant spinal canal or foraminal stenosis. L2-L3: There is a diffuse disc bulge resulting in lateral recess narrowing. Moderate facet arthrosis, thickening of the ligaments and plenum, and dorsal epidural lipomatosis with moderate spinal canal  stenosis. There is mild bilateral foraminal stenosis. L3-L4: There is a diffuse disc bulge resulting in lateral recess narrowing, slightly greater on the right. Moderate facet arthrosis, thickening of the ligamentum flavum, and epidural lipomatosis resulting in mild spinal canal stenosis. There is mild foraminal stenosis on the right. L4-L5: There is diffuse disc bulge and mild disc height loss with lateral recess narrowing. Moderate facet arthrosis and thickening of the ligamentum flavum along with epidural lipomatosis resulting in moderate to severe spinal canal stenosis. There is moderate-to-severe right and moderate left foraminal stenosis. L5-S1: There is a diffuse disc bulge with lateral recess narrowing. Moderate facet arthrosis and epidural lipomatosis. Mild spinal canal stenosis. There is severe left and mild right foraminal stenosis. IMPRESSION: 1. 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 There is moderate-to-severe spinal canal stenosis at L4-5 and moderate spinal canal stenosis at L2-3. 2. Multilevel foraminal stenosis, greatest and severe on the left at L5-S1. Additional moderate-to-severe right and moderate left foraminal stenosis at L4-5. Electronically signed by: Donnice Mania MD  08/15/2023 08:29 PM EDT RP Workstation: [id number]    ED Course:  Treated with ceftriaxone    Assessment/Plan:  88 y.o. male with hx  hx of chronic Foley catheter for urinary retention, TIA, anxiety, debility, recent admission from 7/11-17 with complicated UTI/catheter associated UTI discharged to SNF  Recurrent UTI, catheter associated with chronic foley  Recent micro Proteus and providencia sp CTX S.  - Continue ceftriaxone  1 g IV every 24 hour - Follow-up urine culture  Debility, generalized weakness -PT/OT evaluation  Chronic medical problems: TIA: Not on medication Anxiety: Not on medication  Body mass index  is 34.05 kg/m.  Obesity class I would benefit from weight loss of patient  DVT prophylaxis:  Lovenox  Code Status:  Full Code Diet:  Diet Orders (From admission, onward)    None      Family Communication:  None   Consults:  None   Admission status:   Observation, Med-Surg  Severity of Illness: The appropriate patient status for this patient is OBSERVATION. Observation status is judged to be reasonable and necessary in order to provide the required intensity of service to ensure the patient's safety. The patient's presenting symptoms, physical exam findings, and initial radiographic and laboratory data in the context of their medical condition is felt to place them at decreased risk for further clinical deterioration. Furthermore, it is anticipated that the patient will be medically stable for discharge from the hospital within 2 midnights of admission.    Dorn Dawson, MD [facility]  How to contact the [facility] Attending or Consulting provider 7A - 7P or covering provider during after hours 7P -7A, for this patient.  Check the care team in [facility] and look for a) attending/consulting [facility] provider listed and b) the [facility] team listed Log into [email or web address] and use [facility] universal password to access. If you do not have the password, please contact the hospital operator. Locate the [facility] provider you are looking for under [facility] and page to a number that you can be directly reached. If you still have difficulty reaching the provider, please page the [facility] (Director on Call) for the Hospitalists listed on amion for assistance.  08/15/2023, 10:13 PM

## 2023-08-15 NOTE — ED Notes (Signed)
 300 CC removed from patient cath bag

## 2023-08-15 NOTE — ED Provider Notes (Signed)
 [facility] AT [facility] Provider Note   CSN: [id number] Arrival date & time: 08/15/23  1527     Patient presents with: catheter bag leaking , Abdominal Pain, Back Pain, Shoulder Pain (right), and Leg Pain (left)   Edgar BROCKS Junod Sr. is a 88 y.o. male.   88 yo M with a cc of urine leaking from around his Foley catheter and pain in his penis.  This has been going on since he got home.  Tells me that he was hospitalized for a week he thinks at [facility] and was just discharged today.  He said that he thinks he was there because he had been constipated.  He was started on a bowel regiment and had not had a bowel movement after a week and then went home today and had a bowel movement but because his legs were weak he did not feel like he was doing well at home and was having pain in his penis and decided to come to the hospital for evaluation.  He tells me that his legs been weak for at least 3 weeks.  He is not sure exactly why.  Has had some back pain.  He is not sure what hospital he was in.  Thinks maybe it was in [place].   Abdominal Pain Back Pain Associated symptoms: abdominal pain and leg pain   Shoulder Pain Associated symptoms: back pain   Leg Pain Associated symptoms: back pain        Prior to Admission medications   Medication Sig Start Date End Date Taking? Authorizing Provider  acetaminophen  (TYLENOL ) 325 MG tablet Take 2 tablets (650 mg total) by mouth every 6 (six) hours as needed. 12/05/20   Espinoza, Alejandra, DO  cyanocobalamin  1000 MCG tablet Take 1 tablet (1,000 mcg total) by mouth daily for Vitamin Supplement. Patient taking differently: Take 1,000 mcg by mouth in the morning. Gummies 03/10/21   Dietrich Males, AGNP-C  guaiFENesin (MUCUS RELIEF PO) Take 2 tablets by mouth in the morning.    [provider]  ibuprofen  (ADVIL ) 200 MG tablet Take 1 tablet (200 mg total) by mouth every 6 (six) hours as needed for fever,  headache, mild pain (pain score 1-3) or cramping. 07/27/23   Ghimire, Kuber, MD  Multiple Vitamin (MULTIVITAMIN ADULT PO) Take 1 each by mouth in the morning. Gummies    [provider]  polyethylene glycol (MIRALAX  / GLYCOLAX ) 17 g packet Take 17 g by mouth daily. 07/27/23   Ghimire, Kuber, MD    Allergies: Patient has no known allergies.    Review of Systems  Gastrointestinal:  Positive for abdominal pain.  Musculoskeletal:  Positive for back pain.    Updated Vital Signs BP 118/65   Pulse 61   Temp 98.5 F (36.9 C)   Resp 18   Ht 5' 9 (1.753 m)   Wt 104.6 kg   SpO2 97%   BMI 34.05 kg/m   Physical Exam Vitals and nursing note reviewed.  Constitutional:      Appearance: He is well-developed.  HENT:     Head: Normocephalic and atraumatic.  Eyes:     Pupils: Pupils are equal, round, and reactive to light.  Neck:     Vascular: No JVD.  Cardiovascular:     Rate and Rhythm: Normal rate and regular rhythm.     Heart sounds: No murmur heard.    No friction rub. No gallop.  Pulmonary:     Effort: No respiratory  distress.     Breath sounds: No wheezing.  Abdominal:     General: There is no distension.     Tenderness: There is no abdominal tenderness. There is no guarding or rebound.  Genitourinary:    Comments: Some urine leaking around the Foley catheter.  Circumcised.  Bag was on his lap when I entered in the room and had been so for some time. Musculoskeletal:        General: Normal range of motion.     Cervical back: Normal range of motion and neck supple.  Skin:    Coloration: Skin is not pale.     Findings: No rash.  Neurological:     Mental Status: He is alert and oriented to person, place, and time.  Psychiatric:        Behavior: Behavior normal.     (all labs ordered are listed, but only abnormal results are displayed) Labs Reviewed  URINALYSIS, ROUTINE W REFLEX MICROSCOPIC - Abnormal; Notable for the following components:      Result Value    APPearance HAZY (*)    Hgb urine dipstick MODERATE (*)    Protein, ur 30 (*)    Leukocytes,Ua LARGE (*)    Bacteria, UA RARE (*)    All other components within normal limits  CBC WITH DIFFERENTIAL/PLATELET  COMPREHENSIVE METABOLIC PANEL WITH GFR    EKG: None  Radiology: CT ABDOMEN PELVIS W CONTRAST Result Date: 08/15/2023 CLINICAL DATA:  Acute abdominal pain.  Leaking catheter. EXAM: CT ABDOMEN AND PELVIS WITH CONTRAST TECHNIQUE: Multidetector CT imaging of the abdomen and pelvis was performed using the standard protocol following bolus administration of intravenous contrast. RADIATION DOSE REDUCTION: This exam was performed according to the departmental dose-optimization program which includes automated exposure control, adjustment of the mA and/or kV according to patient size and/or use of iterative reconstruction technique. CONTRAST:  OMNIPAQUE  IOHEXOL  300 MG/ML  SOLN COMPARISON:  CT 07/21/2023 FINDINGS: Lower chest: Lower lobe bronchial thickening without confluent airspace disease. Dependent atelectasis. Hepatobiliary: No focal hepatic abnormality. Layering gallstones in the gallbladder. No pericholecystic inflammation. No biliary dilatation or choledocholithiasis. Pancreas: No ductal dilatation or inflammation. Spleen: Normal in size without focal abnormality. Adrenals/Urinary Tract: No adrenal nodule. No hydronephrosis. No evidence of renal inflammation. No renal calculi. Decompressed ureters. Foley catheter decompresses urinary bladder. Circumferential bladder wall thickening, with mild perivesicular fat stranding bladder. Stomach/Bowel: Small hiatal hernia. Stomach is nondistended. No small bowel obstruction or inflammation. Moderate colonic stool burden. No colonic inflammatory change. Occasional colonic diverticula without diverticulitis. Vascular/Lymphatic: Aortic and branch atherosclerosis. No acute vascular findings. No abdominopelvic adenopathy. Reproductive: Marked prostatic  enlargement causing mass effect on the bladder base. Other: No ascites or free air. Small fat containing left inguinal hernia. Musculoskeletal: There are no acute or suspicious osseous abnormalities. Degenerative change throughout the spine. IMPRESSION: 1. Foley catheter decompresses urinary bladder. Circumferential bladder wall thickening with mild perivesicular fat stranding, can be seen with chronic bladder outlet obstruction or cystitis. 2. Marked prostatic enlargement causing mass effect on the bladder base. 3. Cholelithiasis without CT findings of acute cholecystitis. 4. Small hiatal hernia. Aortic Atherosclerosis (ICD10-I70.0). Electronically Signed   By: Andrea Gasman M.D.   On: 08/15/2023 21:20   MR Lumbar Spine W Wo Contrast Result Date: 08/15/2023 EXAM: MR Lumbar Spine with and without intravenous contrast. 08/15/2023 06:40:27 PM TECHNIQUE: Multiplanar multisequence MRI of the lumbar spine was performed with and without the administration of intravenous contrast. COMPARISON: None available CLINICAL HISTORY: Low back pain, cauda  equina syndrome suspected. Pain abd, back, right shoulder, left leg for several weeks now. Has had reduced gait over 3 weeks. FINDINGS: BONES AND ALIGNMENT: Lumbar lordosis is maintained. No listhesis. No bone marrow edema or evidence of fracture. Vertebral body heights are maintained. No suspicious osseous lesion or abnormal enhancement. SPINAL CORD: The conus medullaris terminates at the L1-2 level. SOFT TISSUES: The paraspinal soft tissues are unremarkable. L1-L2: There is mild spinal canal stenosis and thickening of the ligaments and plenum without significant spinal canal or foraminal stenosis. L2-L3: There is a diffuse disc bulge resulting in lateral recess narrowing. Moderate facet arthrosis, thickening of the ligaments and plenum, and dorsal epidural lipomatosis with moderate spinal canal stenosis. There is mild bilateral foraminal stenosis. L3-L4: There is a diffuse  disc bulge resulting in lateral recess narrowing, slightly greater on the right. Moderate facet arthrosis, thickening of the ligamentum flavum, and epidural lipomatosis resulting in mild spinal canal stenosis. There is mild foraminal stenosis on the right. L4-L5: There is diffuse disc bulge and mild disc height loss with lateral recess narrowing. Moderate facet arthrosis and thickening of the ligamentum flavum along with epidural lipomatosis resulting in moderate to severe spinal canal stenosis. There is moderate-to-severe right and moderate left foraminal stenosis. L5-S1: There is a diffuse disc bulge with lateral recess narrowing. Moderate facet arthrosis and epidural lipomatosis. Mild spinal canal stenosis. There is severe left and mild right foraminal stenosis. IMPRESSION: 1. 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 There is moderate-to-severe spinal canal stenosis at L4-5 and moderate spinal canal stenosis at L2-3. 2. Multilevel foraminal stenosis, greatest and severe on the left at L5-S1. Additional moderate-to-severe right and moderate left foraminal stenosis at L4-5. Electronically signed by: Donnice Mania MD 08/15/2023 08:29 PM EDT RP Workstation: [id number]     Procedures   Medications Ordered in the ED  cefTRIAXone  (ROCEPHIN ) 2 g in sodium chloride  0.9 % 100 mL IVPB (has no administration in time range)  gadobutrol  (GADAVIST ) 1 MMOL/ML injection 10 mL (10 mLs Intravenous Contrast Given 08/15/23 1833)  iohexol  (OMNIPAQUE ) 300 MG/ML solution 100 mL (100 mLs Intravenous Contrast Given 08/15/23 2048)                                    Medical Decision Making Amount and/or Complexity of Data Reviewed Labs: ordered. Radiology: ordered.  Risk Prescription drug management.   88 yo M with multiple complaints.  States that he is here because he was having pain in his urinary catheter site.  Leakage of  urine.  He also has abdominal and back pain as well as all 4 extremity pain and tells me he was just in the hospital with constipation.  Patient is a poor historian and has trouble quantifying exactly what is been going on.  He does tell me that his lower extremities are both equally weak and have been he thinks for about 3 weeks now.  He tells me he was hospitalized but is not sure what hospital he was in.  Said he was there for about a week while waiting to move his bowels.  Will exchange Foley catheter here.  On record review the patient has been seen frequently for pain at the urinary catheter site and leakage around the catheter.  He had his most recent admission about a month ago was documented he has a history of syphilis  which has left him somewhat debilitated at baseline.  Patient's catheter was exchanged and he is feeling a bit better.  I talked with him about going home and he told me that he cannot go home because he cannot walk.  I called the son.  He provide some further history.  Tells me that the patient does have significant difficulties ambulating but usually is able to get up and go to the bathroom and was unable to get up out of bed today.  He said he is not been able to get up for probably 3 weeks.  He was just in rehab to try and get better but unfortunately had no improvement.  He does not think he is able to take care of him at home.  He thinks he needs to go back into a facility.  The son was also concerned about abdominal and back pain the patient had been having.  Will obtain blood work CT imaging MRI to assess for cauda equina.  MRI without cauda equina.  UA equivocal, large whites leukocyte esterase few bacteria.  Yeast.  No significant electrolyte abnormality.  No leukocytosis no anemia.  CT of the abdomen pelvis with concern for possible cystitis.  Patient still unable to stand on his own.  Will start on Rocephin .  Will discuss with medicine.  The patients results  and plan were reviewed and discussed.   Any x-rays performed were independently reviewed by myself.   Differential diagnosis were considered with the presenting HPI.  Medications  cefTRIAXone  (ROCEPHIN ) 2 g in sodium chloride  0.9 % 100 mL IVPB (has no administration in time range)  gadobutrol  (GADAVIST ) 1 MMOL/ML injection 10 mL (10 mLs Intravenous Contrast Given 08/15/23 1833)  iohexol  (OMNIPAQUE ) 300 MG/ML solution 100 mL (100 mLs Intravenous Contrast Given 08/15/23 2048)    Vitals:   08/15/23 1543 08/15/23 1849 08/15/23 1925 08/15/23 2030  BP:  128/61  118/65  Pulse:  78  61  Resp:  16  18  Temp:   98.5 F (36.9 C)   TempSrc:      SpO2:  99%  97%  Weight: 104.6 kg     Height: 5' 9 (1.753 m)       Final diagnoses:  Acute cystitis without hematuria    Admission/ observation were discussed with the admitting physician, patient and/or family and they are comfortable with the plan.       Final diagnoses:  Acute cystitis without hematuria    ED Discharge Orders     None          Emil Share, DO 08/15/23 2132

## 2023-08-15 NOTE — ED Triage Notes (Addendum)
 Pt BIBA from home, c/o catheter bag has been leaking. Denies any bleeding, trauma from cath, Pain abd, back, right shoulder, left leg for several weeks now. Son stated pt has kidney stones.  Has had reduced gait over 3 weeks. Current cath has been in for 3 weeks. VSS.  A&Ox4

## 2023-08-16 DIAGNOSIS — N390 Urinary tract infection, site not specified: Secondary | ICD-10-CM | POA: Diagnosis not present

## 2023-08-16 DIAGNOSIS — R531 Weakness: Secondary | ICD-10-CM | POA: Diagnosis not present

## 2023-08-16 LAB — TSH: TSH: 3.439 u[IU]/mL (ref 0.350–4.500)

## 2023-08-16 MED ORDER — PNEUMOCOCCAL 20-VAL CONJ VACC 0.5 ML IM SUSY
0.5000 mL | PREFILLED_SYRINGE | INTRAMUSCULAR | Status: DC
  Filled 2023-08-16: qty 0.5

## 2023-08-16 NOTE — Evaluation (Signed)
 Occupational Therapy Evaluation Patient Details Name: Edgar ROMANOSKI Sr. MRN: [id number] DOB: 05/22/1935 Today's Date: 08/16/2023   History of Present Illness   Edgar ROUT Sr. is a 88 y.o. male with hx of chronic Foley catheter for urinary retention, TIA, anxiety, debility, recent admission from 7/11-17 with complicated UTI/catheter associated UTI discharged to SNF. He returns with generalized weakness concern about his safety at home     Clinical Impressions PTA, patient lives  at son's home with family. Recently discharged from hospital and rehab with inability to manage at home for 1-2 days before coming back to hospital. Prior to illness, patient was mod I. Currently, patient presents with deficits outlined below (see OT Problem List for details) most significantly decreased UE ROM, activity tolerance, balance and generalized muscle weakness for BADL's and functional mobility. Patient requires continued Acute care hospital level OT services to progress safety and functional performance and allow for discharge. Anticipate with progression in hospital setting, family support and assist, OT recommending HHOT services upon discharge.        If plan is discharge home, recommend the following:   A lot of help with walking and/or transfers;A lot of help with bathing/dressing/bathroom;Assistance with cooking/housework;Assist for transportation;Help with stairs or ramp for entrance     Functional Status Assessment   Patient has had a recent decline in their functional status and demonstrates the ability to make significant improvements in function in a reasonable and predictable amount of time.     Equipment Recommendations   Other (comment) (Lift chair info provided to patient for family to obtain)      Precautions/Restrictions   Precautions Precautions: Fall Recall of Precautions/Restrictions: Intact Restrictions Weight Bearing Restrictions Per Provider Order: No      Mobility Bed Mobility Overal bed mobility: Needs Assistance Bed Mobility: Supine to Sit     Supine to sit: Min assist, Used rails, HOB elevated     General bed mobility comments: increased time and effort to scoot to EOB    Transfers Overall transfer level: Needs assistance Equipment used: Rolling walker (2 wheels) Transfers: Sit to/from Stand, Bed to chair/wheelchair/BSC Sit to Stand: Mod assist, From elevated surface     Step pivot transfers: Mod assist, From elevated surface     General transfer comment: OOB to recliner, rec + 2 to NT just due to size      Balance Overall balance assessment: Needs assistance Sitting-balance support: Feet supported Sitting balance-Leahy Scale: Fair     Standing balance support: Bilateral upper extremity supported, During functional activity, Reliant on assistive device for balance Standing balance-Leahy Scale: Poor Standing balance comment: Reliant on BUE suport                           ADL either performed or assessed with clinical judgement   ADL Overall ADL's : Needs assistance/impaired Eating/Feeding: Set up;Sitting   Grooming: Set up;Sitting   Upper Body Bathing: Minimal assistance;Sitting   Lower Body Bathing: Moderate assistance   Upper Body Dressing : Minimal assistance   Lower Body Dressing: Moderate assistance   Toilet Transfer: Minimal assistance;Stand-pivot   Toileting- Clothing Manipulation and Hygiene: Minimal assistance Toileting - Clothing Manipulation Details (indicate cue type and reason): Foley catheter for voiding management     Functional mobility during ADLs: Minimal assistance;Moderate assistance General ADL Comments: decreased activity tolerance     Vision Patient Visual Report: No change from baseline;Blurring of vision;Other (comment) (able to read clock but  reports decreased clarity)              Pertinent Vitals/Pain Pain Assessment Pain Assessment: 0-10 Pain Score: 2   Pain Location: B shoulders and LE's Pain Descriptors / Indicators: Aching Pain Intervention(s): Limited activity within patient's tolerance, Monitored during session, Repositioned     Extremity/Trunk Assessment Upper Extremity Assessment Upper Extremity Assessment: Right hand dominant;RUE deficits/detail;LUE deficits/detail RUE Deficits / Details: Pt has decrease in AROM in BUE to ~0- 50 degrees RUE Sensation: WNL RUE Coordination: decreased fine motor;decreased gross motor LUE Deficits / Details: ~ 0-65 degrees AROM shoulder   Lower Extremity Assessment Lower Extremity Assessment: Defer to PT evaluation   Cervical / Trunk Assessment Cervical / Trunk Assessment: Normal   Communication Communication Communication: Impaired Factors Affecting Communication: Reduced clarity of speech   Cognition Arousal: Alert Behavior During Therapy: WFL for tasks assessed/performed Cognition: No apparent impairments                               Following commands: Intact       Cueing  General Comments   Cueing Techniques: Verbal cues;Tactile cues  B Le edema R>L           Home Living Family/patient expects to be discharged to:: Private residence Living Arrangements: Children Available Help at Discharge: Family;Available 24 hours/day Type of Home: House Home Access: Stairs to enter [employer] of Steps: 2 Entrance Stairs-Rails: None Home Layout: One level     Bathroom Shower/Tub: Tub/shower unit;Curtain   Bathroom Toilet: Standard Bathroom Accessibility: No   Home Equipment: Rollator (4 wheels);Other (comment);BSC/3in1;Shower seat;Adaptive equipment;Cane - single point Adaptive Equipment: Reacher    Lives With: Family    Prior Functioning/Environment Prior Level of Function : Independent/Modified Independent;History of Falls (last six months) (before hospitalization and rehab early July 2025)             Mobility Comments: Use of rollator-  since snf rehab was having difficulty with all mobility ADLs Comments: Use of reacher for LB ADLs, son manages meds and finances    OT Problem List: Decreased strength;Decreased range of motion;Decreased activity tolerance;Impaired balance (sitting and/or standing);Cardiopulmonary status limiting activity;Obesity;Impaired UE functional use;Pain;Increased edema   OT Treatment/Interventions: Self-care/ADL training;Therapeutic exercise;Energy conservation;DME and/or AE instruction;Therapeutic activities;Patient/family education;Balance training      OT Goals(Current goals can be found in the care plan section)   Acute Rehab OT Goals Patient Stated Goal: to get stronger and go home not rehab OT Goal Formulation: With patient Time For Goal Achievement: 08/30/23 Potential to Achieve Goals: Good ADL Goals Pt Will Perform Lower Body Bathing: with contact guard assist;with adaptive equipment;sit to/from stand Pt Will Perform Lower Body Dressing: with contact guard assist;with adaptive equipment;sit to/from stand Pt Will Transfer to Toilet: with contact guard assist;ambulating;grab bars;bedside commode Pt Will Perform Toileting - Clothing Manipulation and hygiene: with min assist;sit to/from stand Pt Will Perform Tub/Shower Transfer: with min assist;ambulating;shower seat Pt/caregiver will Perform Home Exercise Program: Increased ROM;Both right and left upper extremity;With written HEP provided;With Supervision   OT Frequency:  Min 2X/week       AM-PAC OT 6 Clicks Daily Activity     Outcome Measure Help from another person eating meals?: None Help from another person taking care of personal grooming?: A Little Help from another person toileting, which includes using toliet, bedpan, or urinal?: A Lot Help from another person bathing (including washing, rinsing, drying)?: A Lot Help from another person  to put on and taking off regular upper body clothing?: A Little Help from another person  to put on and taking off regular lower body clothing?: A Lot 6 Click Score: 16   End of Session Equipment Utilized During Treatment: Gait belt;Rolling walker (2 wheels) Nurse Communication: Mobility status  Activity Tolerance: Patient tolerated treatment well Patient left: in chair;with call bell/phone within reach;with chair alarm set  OT Visit Diagnosis: Unsteadiness on feet (R26.81);Other abnormalities of gait and mobility (R26.89);Muscle weakness (generalized) (M62.81)                Time: 1010-1046 OT Time Calculation (min): 36 min Charges:  OT General Charges $OT Visit: 1 Visit OT Evaluation $OT Eval Moderate Complexity: 1 Mod OT Treatments $Self Care/Home Management : 8-22 mins  Hero Mccathern OT/L Acute Rehabilitation Department  [phone]  08/16/2023, 6:55 PM

## 2023-08-16 NOTE — Evaluation (Signed)
 Physical Therapy Evaluation Patient Details Name: Edgar CONELY Sr. MRN: [id number] DOB: [birth year] Today's Date: 08/16/2023  History of Present Illness  Edgar BRONER Sr. is a 88 y.o. male with hx of chronic Foley catheter for urinary retention, TIA, anxiety, debility, recent admission from 7/11-17 with complicated UTI/catheter associated UTI discharged to SNF. He returned to hospital on 08/15/2023 with generalized weakness concern about his safety at home.  Clinical Impression    Pt admitted with above diagnosis.  Pt currently with functional limitations due to the deficits listed below (see PT Problem List). Pt seated in recliner when PT arrived. Pt indicated pain and discomfort from prostate and foley placement. Pt requesting to get back to bed. PT able to encourage pt to perform short amb bout in personal room 15 feet with RW and CGA and cues, pt required min A for sit to stand  from recliner and CGA for sit to supine. Pt left in bed and all needs in place. PT made nursing staff aware of pt complaints of pain and foley. Pt reported he does not want to transition to skilled nursing s/p hospital d/c and is agreeable to [facility] services.Pt will benefit from acute skilled PT to increase their independence and safety with mobility to allow discharge.         If plan is discharge home, recommend the following: Help with stairs or ramp for entrance;Assist for transportation;Assistance with cooking/housework;A little help with walking and/or transfers;A little help with bathing/dressing/bathroom   Can travel by private vehicle   Yes    Equipment Recommendations None recommended by PT (24/7 physical assist)  Recommendations for Other Services       Functional Status Assessment Patient has had a recent decline in their functional status and demonstrates the ability to make significant improvements in function in a reasonable and predictable amount of time.     Precautions / Restrictions  Precautions Precautions: Fall Recall of Precautions/Restrictions: Intact Restrictions Weight Bearing Restrictions Per Provider Order: No      Mobility  Bed Mobility Overal bed mobility: Needs Assistance Bed Mobility: Sit to Supine           General bed mobility comments: increased time and cues for sit to supine    Transfers Overall transfer level: Needs assistance Equipment used: Rolling walker (2 wheels) Transfers: Sit to/from Stand Sit to Stand: Min assist           General transfer comment: pt seated in recliner when PT arrived, pt required cues to scoot anteriorly in recliner c/o pain in grioin and penis due to foley, pt required 2 attempts and min A for push to stand from recliner    Ambulation/Gait Ambulation/Gait assistance: Contact guard assist Gait Distance (Feet): 15 Feet Assistive device: Rolling walker (2 wheels) Gait Pattern/deviations: Wide base of support, Trunk flexed, Step-to pattern, Decreased step length - left, Decreased stance time - right Gait velocity: decreased     General Gait Details: cues for posture, proper distance from RW. safety with turns, pt amb with decreaed cadence, short stride length limited B foot clearance slight B toe out and reliance on B UE support at RW for balance  Stairs            Wheelchair Mobility     Tilt Bed    Modified Rankin (Stroke Patients Only)       Balance Overall balance assessment: Needs assistance Sitting-balance support: Feet supported Sitting balance-Leahy Scale: Good     Standing balance support: Bilateral  upper extremity supported, During functional activity, Reliant on assistive device for balance Standing balance-Leahy Scale: Poor Standing balance comment: Reliant on BUE suport                             Pertinent Vitals/Pain Pain Assessment Pain Assessment: 0-10 Pain Score: 6  Faces Pain Scale: Hurts a little bit Pain Location: R flank, neck, penis, B shoulders  and LE Pain Descriptors / Indicators: Aching, Constant, Discomfort Pain Intervention(s): Limited activity within patient's tolerance, Monitored during session, Repositioned    Home Living Family/patient expects to be discharged to:: Inpatient rehab Living Arrangements: Children Available Help at Discharge: Family;Available 24 hours/day Type of Home: House Home Access: Stairs to enter Entrance Stairs-Rails: None Entrance Stairs-Number of Steps: 2   Home Layout: One level Home Equipment: Rollator (4 wheels);Other (comment);BSC/3in1;Shower seat;Adaptive equipment;Cane - single point      Prior Function Prior Level of Function : Independent/Modified Independent;History of Falls (last six months) (before hospitalization and rehab early July 2025)             Mobility Comments: Use of rollator- since snf rehab was having difficulty with all mobility ADLs Comments: Use of reacher for LB ADLs, son manages meds and finances     Extremity/Trunk Assessment   Upper Extremity Assessment Upper Extremity Assessment: Defer to OT evaluation RUE Deficits / Details: Pt has decrease in AROM in BUE to ~0- 50 degrees RUE Sensation: WNL RUE Coordination: decreased fine motor;decreased gross motor LUE Deficits / Details: ~ 0-65 degrees AROM shoulder    Lower Extremity Assessment Lower Extremity Assessment: Generalized weakness    Cervical / Trunk Assessment Cervical / Trunk Assessment: Normal  Communication   Communication Communication: Impaired Factors Affecting Communication: Reduced clarity of speech    Cognition Arousal: Alert Behavior During Therapy: WFL for tasks assessed/performed   PT - Cognitive impairments: No apparent impairments                         Following commands: Intact       Cueing       General Comments General comments (skin integrity, edema, etc.): B LE edema R > L    Exercises     Assessment/Plan    PT Assessment Patient needs  continued PT services  PT Problem List Decreased strength;Decreased activity tolerance;Decreased balance;Decreased mobility;Decreased safety awareness;Pain       PT Treatment Interventions DME instruction;Balance training;Gait training;Stair training;Functional mobility training;Therapeutic activities;Therapeutic exercise;Patient/family education;Neuromuscular re-education    PT Goals (Current goals can be found in the Care Plan section)  Acute Rehab PT Goals Patient Stated Goal: get this foley straightened out PT Goal Formulation: With patient Time For Goal Achievement: 08/30/23 Potential to Achieve Goals: Fair    Frequency Min 3X/week     Co-evaluation               AM-PAC PT 6 Clicks Mobility  Outcome Measure Help needed turning from your back to your side while in a flat bed without using bedrails?: A Little Help needed moving from lying on your back to sitting on the side of a flat bed without using bedrails?: A Little Help needed moving to and from a bed to a chair (including a wheelchair)?: A Little Help needed standing up from a chair using your arms (e.g., wheelchair or bedside chair)?: A Lot Help needed to walk in hospital room?: A Little Help needed climbing 3-5  steps with a railing? : Total 6 Click Score: 15    End of Session Equipment Utilized During Treatment: Gait belt Activity Tolerance: Patient limited by fatigue;Patient limited by pain Patient left: in bed;with call bell/phone within reach Nurse Communication: Mobility status PT Visit Diagnosis: Unsteadiness on feet (R26.81);Other abnormalities of gait and mobility (R26.89);Muscle weakness (generalized) (M62.81);Difficulty in walking, not elsewhere classified (R26.2);Pain    Time: 8573-8495 PT Time Calculation (min) (ACUTE ONLY): 38 min   Charges:   PT Evaluation $PT Eval Low Complexity: 1 Low PT Treatments $Gait Training: 8-22 mins $Therapeutic Activity: 8-22 mins PT General Charges $$ ACUTE  PT VISIT: 1 Visit         [place], PT Acute Rehab   [place] VEAR Drone 08/16/2023, 3:20 PM

## 2023-08-16 NOTE — Plan of Care (Signed)
  Problem: Activity: Goal: Risk for activity intolerance will decrease Outcome: Progressing   Problem: Coping: Goal: Level of anxiety will decrease Outcome: Progressing   Problem: Elimination: Goal: Will not experience complications related to bowel motility Outcome: Progressing Goal: Will not experience complications related to urinary retention Outcome: Progressing   Problem: Safety: Goal: Ability to remain free from injury will improve Outcome: Progressing   Problem: Skin Integrity: Goal: Risk for impaired skin integrity will decrease Outcome: Progressing

## 2023-08-16 NOTE — Progress Notes (Signed)
 PROGRESS NOTE    Edgar GIRAUD Sr.  FMW:[id number] DOB: [birth year] DOA: 08/15/2023 PCP: [facility]   Brief Narrative:  88 y.o. 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 He returns with generalized weakness concern about his safety at home. Patient provides limited information but feels generally weak, denies any recent fall. No focal weakness or numbness, speech change, vision change, HA. He has discomfort which he attributes to his foley. No fever.    He was brought to the ER with complaints of abdominal pain, his catheter bag leaking, back pain shoulder pain and left leg pain.  He was admitted to the hospital 7/11 through 7/17 with complicated UTI.  Urine culture at that time grew Proteus and procidentia.  He was treated with Rocephin  and changed to Bactrim  to complete 7 days of therapy.  His Foley catheter was changed on 07/25/2023.  He had some leaking around the catheter even at that time, there was no evidence of urinary retention.  He has had multiple ED visits due to Foley issues. UA this admission shows large amount of leukocytes 30 protein rare bacteria more than 50 WBC.  Assessment & Plan:   Principal Problem:   Generalized weakness Active Problems:   Recurrent UTI   Recurrent UTI, catheter associated with chronic foley  Recent micro Proteus and providencia sp CTX and bactrim   - Continue ceftriaxone  1 g IV every 24 hour - Follow-up urine culture   Debility, generalized weakness -PT/OT evaluation   Chronic medical problems: TIA: Not on medication Anxiety: Not on medication    Estimated body mass index is 34.05 kg/m as calculated from the following:   Height as of this encounter: 5' 9 (1.753 m).   Weight as of this encounter: 104.6 kg.  DVT  prophylaxis: lovenox  Code Status: full Family Communication:  Disposition Plan:  Status is: Observation The patient remains OBS appropriate and will d/c before 2 midnights.   Consultants: none  Procedures: none Antimicrobials: rocephin   Subjective:  He is resting in bed no events overnight   Objective: Vitals:   08/15/23 2030 08/15/23 2318 08/16/23 0541 08/16/23 0749  BP: 118/65 (!) 144/64 106/61 126/62  Pulse: 61 64 62 (!) 55  Resp: 18 18 16 16   Temp:  98.2 F (36.8 C) 98.1 F (36.7 C) 98.7 F (37.1 C)  TempSrc:  Oral Oral Oral  SpO2: 97% 92% 100% 100%  Weight:      Height:        Intake/Output Summary (Last 24 hours) at 08/16/2023 1123 Last data filed at 08/16/2023 0845 Gross per 24 hour  Intake 240 ml  Output 1100 ml  Net -860 ml   Filed Weights   08/15/23 1543  Weight: 104.6 kg    Examination:  General exam: Appears in no acute distress Respiratory system: Clear to auscultation. Respiratory effort normal. Cardiovascular system: Regular  gastrointestinal system: Abdomen is nondistended, soft and nontender. No organomegaly or masses felt. Normal bowel sounds heard. Central nervous system: Alert and oriented.  Extremities: Trace edema bilaterally    Data Reviewed: I have personally reviewed following labs and imaging studies  CBC: Recent Labs  Lab 08/15/23 1814  WBC 8.0  NEUTROABS 4.8  HGB 13.3  HCT 42.7  MCV 87.3  PLT 206   Basic Metabolic Panel: Recent Labs  Lab 08/15/23 1814  NA 144  K 4.0  CL 106  CO2 25  GLUCOSE 97  BUN 21  CREATININE 0.76  CALCIUM  9.9   GFR: Estimated Creatinine Clearance: 79 mL/min (by C-G formula based on SCr of 0.76 mg/dL). Liver Function Tests: Recent Labs  Lab 08/15/23 1814  AST 23  ALT 25  ALKPHOS 60  BILITOT 0.6  PROT 7.7  ALBUMIN 3.7   No results for input(s): LIPASE, AMYLASE in the last 168 hours. No results for input(s): AMMONIA in the last 168 hours. Coagulation Profile: No results for  input(s): INR, PROTIME in the last 168 hours. Cardiac Enzymes: No results for input(s): CKTOTAL, CKMB, CKMBINDEX, TROPONINI in the last 168 hours. BNP (last 3 results) No results for input(s): PROBNP in the last 8760 hours. HbA1C: No results for input(s): HGBA1C in the last 72 hours. CBG: No results for input(s): GLUCAP in the last 168 hours. Lipid Profile: No results for input(s): CHOL, HDL, LDLCALC, TRIG, CHOLHDL, LDLDIRECT in the last 72 hours. Thyroid Function Tests: Recent Labs    08/16/23 0037  TSH 3.439   Anemia Panel: No results for input(s): VITAMINB12, FOLATE, FERRITIN, TIBC, IRON , RETICCTPCT in the last 72 hours. Sepsis Labs: No results for input(s): PROCALCITON, LATICACIDVEN in the last 168 hours.  No results found for this or any previous visit (from the past 240 hours).       Radiology Studies: CT ABDOMEN PELVIS W CONTRAST Result Date: 08/15/2023 CLINICAL DATA:  Acute abdominal pain.  Leaking catheter. EXAM: CT ABDOMEN AND PELVIS WITH CONTRAST TECHNIQUE: Multidetector CT imaging of the abdomen and pelvis was performed using the standard protocol following bolus administration of intravenous contrast. RADIATION DOSE REDUCTION: This exam was performed according to the departmental dose-optimization program which includes automated exposure control, adjustment of the mA and/or kV according to patient size and/or use of iterative reconstruction technique. CONTRAST:  OMNIPAQUE  IOHEXOL  300 MG/ML  SOLN COMPARISON:  CT 07/21/2023 FINDINGS: Lower chest: Lower lobe bronchial thickening without confluent airspace disease. Dependent atelectasis. Hepatobiliary: No focal hepatic abnormality. Layering gallstones in the gallbladder. No pericholecystic inflammation. No biliary dilatation or choledocholithiasis. Pancreas: No ductal dilatation or inflammation. Spleen: Normal in size without focal abnormality. Adrenals/Urinary Tract: No  adrenal nodule. No hydronephrosis. No evidence of renal inflammation. No renal calculi. Decompressed ureters. Foley catheter decompresses urinary bladder. Circumferential bladder wall thickening, with mild perivesicular fat stranding bladder. Stomach/Bowel: Small hiatal hernia. Stomach is nondistended. No small bowel obstruction or inflammation. Moderate colonic stool burden. No colonic inflammatory change. Occasional colonic diverticula without diverticulitis. Vascular/Lymphatic: Aortic and branch atherosclerosis. No acute vascular findings. No abdominopelvic adenopathy. Reproductive: Marked prostatic enlargement causing mass effect on the bladder base. Other: No ascites or free air. Small fat containing left inguinal hernia. Musculoskeletal: There are no acute or suspicious osseous abnormalities. Degenerative change throughout the spine. IMPRESSION: 1. Foley catheter decompresses urinary bladder. Circumferential bladder wall thickening with mild perivesicular fat stranding, can be seen with chronic bladder outlet obstruction or cystitis. 2. Marked prostatic enlargement causing mass effect on the bladder base. 3. Cholelithiasis without CT findings of acute cholecystitis. 4. Small hiatal hernia. Aortic Atherosclerosis (ICD10-I70.0). Electronically Signed   By: Andrea Gasman M.D.   On: 08/15/2023 21:20   MR Lumbar Spine W Wo Contrast Result Date: 08/15/2023 EXAM: MR Lumbar Spine with and without intravenous contrast. 08/15/2023 06:40:27 PM TECHNIQUE: Multiplanar multisequence MRI of the lumbar spine was performed with and without the administration of intravenous contrast. COMPARISON: None available CLINICAL HISTORY: Low back pain, cauda  equina syndrome suspected. Pain abd, back, right shoulder, left leg for several weeks now. Has had reduced gait over 3 weeks. FINDINGS: BONES AND ALIGNMENT: Lumbar lordosis is maintained. No listhesis. No bone marrow edema or evidence of fracture. Vertebral body heights are  maintained. No suspicious osseous lesion or abnormal enhancement. SPINAL CORD: The conus medullaris terminates at the L1-2 level. SOFT TISSUES: The paraspinal soft tissues are unremarkable. L1-L2: There is mild spinal canal stenosis and thickening of the ligaments and plenum without significant spinal canal or foraminal stenosis. L2-L3: There is a diffuse disc bulge resulting in lateral recess narrowing. Moderate facet arthrosis, thickening of the ligaments and plenum, and dorsal epidural lipomatosis with moderate spinal canal stenosis. There is mild bilateral foraminal stenosis. L3-L4: There is a diffuse disc bulge resulting in lateral recess narrowing, slightly greater on the right. Moderate facet arthrosis, thickening of the ligamentum flavum, and epidural lipomatosis resulting in mild spinal canal stenosis. There is mild foraminal stenosis on the right. L4-L5: There is diffuse disc bulge and mild disc height loss with lateral recess narrowing. Moderate facet arthrosis and thickening of the ligamentum flavum along with epidural lipomatosis resulting in moderate to severe spinal canal stenosis. There is moderate-to-severe right and moderate left foraminal stenosis. L5-S1: There is a diffuse disc bulge with lateral recess narrowing. Moderate facet arthrosis and epidural lipomatosis. Mild spinal canal stenosis. There is severe left and mild right foraminal stenosis. IMPRESSION: 1. 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 There is moderate-to-severe spinal canal stenosis at L4-5 and moderate spinal canal stenosis at L2-3. 2. Multilevel foraminal stenosis, greatest and severe on the left at L5-S1. Additional moderate-to-severe right and moderate left foraminal stenosis at L4-5. Electronically signed by: Donnice Mania MD 08/15/2023 08:29 PM EDT RP Workstation: [id number]     Scheduled Meds:  enoxaparin  (LOVENOX ) injection   40 mg Subcutaneous Q24H   [START ON 08/17/2023] pneumococcal 20-valent conjugate vaccine  0.5 mL Intramuscular Tomorrow-1000   Continuous Infusions:  cefTRIAXone  (ROCEPHIN )  IV       LOS: 0 days    Time spent: 40 minutes  Almarie KANDICE Hoots, MD 08/16/2023, 11:23 AM

## 2023-08-16 NOTE — Care Management Obs Status (Signed)
 MEDICARE OBSERVATION STATUS NOTIFICATION   Patient Details  Name: Edgar SALDIVAR Sr. MRN: [id number] Date of Birth: 03-Feb-1935   Medicare Observation Status Notification Given:  Yes    Doneta Glenys DASEN, RN 08/16/2023, 3:24 PM

## 2023-08-16 NOTE — TOC Initial Note (Signed)
 Transition of Care (TOC) - Initial/Assessment Note    Patient Details  Name: Edgar SOLORIO Sr. MRN: [id number] Date of Birth: 06/15/1935  Transition of Care [facility]) CM/SW Contact:    Doneta Glenys DASEN, RN Phone Number: 08/16/2023, 3:33 PM  Clinical Narrative:                 MOON completed. Presented for generalized weakness. Patient states PTA he recently was discharged from [facility] and was not able to walk. Before that lives in a house with son and grandson. Verified PCP/insurance;DME- WC, rollator, BSC, [place]; Denies HH, oxygen and SDOH needs. Patient states his son will pick him up if he can walk. CM will follow for ambulation needs.  Barriers to Discharge: Continued Medical Work up   Patient Goals and CMS Choice Patient states their goals for this hospitalization and ongoing recovery are:: I dont know CMS Medicare.gov Compare Post Acute Care list provided to::  (NA) Choice offered to / list presented to : NA [place] ownership interest in [facility]provided to:: Parent NA    Expected Discharge Plan and Services In-house Referral: NA Discharge Planning Services: CM Consult Post Acute Care Choice: NA Living arrangements for the past 2 months: Single Family Home                 DME Arranged: N/A DME Agency: NA       HH Arranged: NA HH Agency: NA        Prior Living Arrangements/Services Living arrangements for the past 2 months: Single Family Home Lives with:: Adult Children Patient language and need for interpreter reviewed:: No Do you feel safe going back to the place where you live?: Yes      Need for Family Participation in Patient Care: Yes (Comment) Care giver support system in place?: Yes (comment) Current home services: DME (WC, BSC [place] rollator) Criminal Activity/Legal Involvement Pertinent to Current Situation/Hospitalization: No - Comment as needed  Activities of Daily Living   ADL Screening (condition at time of  admission) Independently performs ADLs?: No Does the patient have a NEW difficulty with bathing/dressing/toileting/self-feeding that is expected to last >3 days?: Yes (Initiates electronic notice to provider for possible OT consult) Does the patient have a NEW difficulty with getting in/out of bed, walking, or climbing stairs that is expected to last >3 days?: Yes (Initiates electronic notice to provider for possible PT consult) Does the patient have a NEW difficulty with communication that is expected to last >3 days?: No Is the patient deaf or have difficulty hearing?: Yes Does the patient have difficulty seeing, even when wearing glasses/contacts?: Yes Does the patient have difficulty concentrating, remembering, or making decisions?: Yes  Permission Sought/Granted Permission sought to share information with : Case Manager Permission granted to share information with : Yes, Verbal Permission Granted  Share Information with NAME: Sayyid, [place] (Son)  [phone]           Emotional Assessment Appearance:: Appears stated age Attitude/Demeanor/Rapport: Engaged Affect (typically observed): Appropriate Orientation: : Oriented to Self, Oriented to  Time, Oriented to Situation Alcohol / Substance Use: Not Applicable Psych Involvement: No (comment)  Admission diagnosis:  Acute cystitis without hematuria [N30.00] Generalized weakness [R53.1] Patient Active Problem List   Diagnosis Date Noted   Generalized weakness 08/15/2023   Recurrent UTI 07/22/2023   Fall at home, initial encounter 07/21/2023   TIA (transient ischemic attack) 12/07/2020   Urinary retention 12/05/2020   Muscle weakness (generalized) 12/05/2020   Unsteadiness  on feet 12/05/2020   Frailty 12/05/2020   Acute cystitis with hematuria    Poor social situation    Obstruction of Foley catheter (HCC) 11/30/2020   PCP:  [facility] Pharmacy:   [facility] - [facility] [address]  [place] [address] Phone: [phone] Fax: [phone]  [id number] [place], [place] - [address] AT [facility] [address] [facility] [address] & [address] [place] [address] Phone: [phone] Fax: [phone]  [place] [facility] [address], [address] [place] [address] Phone: [phone] Fax: [phone]     Social Drivers of Health (SDOH) Social History: SDOH Screenings   Food Insecurity: No Food Insecurity (08/16/2023)  Housing: Low Risk  (08/16/2023)  Transportation Needs: No Transportation Needs (08/16/2023)  Utilities: Not At Risk (08/16/2023)  Depression (PHQ2-9): Low Risk  (03/25/2021)  Social Connections: Unknown (08/16/2023)  Recent Concern: Social Connections - Socially Isolated (08/16/2023)  Tobacco Use: High Risk (08/15/2023)   SDOH Interventions:     Readmission Risk Interventions     No data to display

## 2023-08-17 ENCOUNTER — Ambulatory Visit: Admitting: Podiatry

## 2023-08-17 DIAGNOSIS — R531 Weakness: Secondary | ICD-10-CM | POA: Diagnosis not present

## 2023-08-17 DIAGNOSIS — U071 COVID-19: Secondary | ICD-10-CM | POA: Diagnosis not present

## 2023-08-17 MED ORDER — ALUM & MAG HYDROXIDE-SIMETH 200-200-20 MG/5ML PO SUSP
30.0000 mL | ORAL | Status: DC | PRN
  Filled 2023-08-17: qty 30

## 2023-08-17 NOTE — Progress Notes (Signed)
 Occupational Therapy Treatment Patient Details Name: Edgar KUSS Sr. MRN: [id number] DOB: 1935-06-05 Today's Date: 08/17/2023   History of present illness Edgar MONTEFORTE Sr. is a 88 y.o. male with hx of chronic Foley catheter for urinary retention, TIA, anxiety, debility, recent admission from 7/11-17 with complicated UTI/catheter associated UTI discharged to SNF. He returns with generalized weakness concern about his safety at home   OT comments  Patient seen for skilled OT session this afternoon. Patient open to all therapy presented. Improved overall functional mobility from bed to sink side with RW for standing tolerance and balance tasks for BADL's with heavy min A and reliant on RW. See below for safety concerns for stand to sit as patient tends to decelerate too rapidly and cause safety concerns. Initiated UE HEP but then lunch arrived. Patient adamant OT return for later session to complete.       If plan is discharge home, recommend the following:  A lot of help with walking and/or transfers;A lot of help with bathing/dressing/bathroom;Assistance with cooking/housework;Assist for transportation;Help with stairs or ramp for entrance   Equipment Recommendations  Other (comment) (lift chair rec to be obtained by family as patient requesting to son)       Precautions / Restrictions Precautions Precautions: Fall Recall of Precautions/Restrictions: Intact Restrictions Weight Bearing Restrictions Per Provider Order: No       Mobility Bed Mobility Overal bed mobility: Needs Assistance Bed Mobility: Supine to Sit     Supine to sit: Contact guard, HOB elevated, Used rails     General bed mobility comments: needs increased time with cues for LE management and trunk upright with cues for technique especially 1st thing in am due to prolonged immobility in bed    Transfers Overall transfer level: Needs assistance Equipment used: Rolling walker (2 wheels) Transfers: Sit to/from  Stand, Bed to chair/wheelchair/BSC Sit to Stand: Min assist, From elevated surface     Step pivot transfers: Min assist, From elevated surface     General transfer comment: short in room amb from R side of bed to sink side then to recliner with RW with heavy min A and cues for safety and hand and hip position in front of transfer surface, cues for safe deceleration     Balance Overall balance assessment: Needs assistance Sitting-balance support: Feet supported Sitting balance-Leahy Scale: Fair   Postural control: Posterior lean Standing balance support: Bilateral upper extremity supported, Reliant on assistive device for balance Standing balance-Leahy Scale: Poor Standing balance comment: Reliant on BUE suport                           ADL either performed or assessed with clinical judgement   ADL Overall ADL's : Needs assistance/impaired Eating/Feeding: Set up;Sitting   Grooming: Wash/dry hands;Wash/dry face;Oral care;Sitting;Set up               Lower Body Dressing: Moderate assistance;Sitting/lateral leans Lower Body Dressing Details (indicate cue type and reason): unable to complete figure 4 for sock changes from regular to bari size, may benefit from wide sock aide trial             Functional mobility during ADLs: Minimal assistance;Rolling walker (2 wheels) General ADL Comments: educated on safe deceleration for stand to sit, brief sink side standing for tolerance and progression of balance    Extremity/Trunk Assessment Upper Extremity Assessment Upper Extremity Assessment: Right hand dominant;Generalized weakness RUE Deficits / Details: Pt has decrease  in AROM in BUE to ~0- 50 degrees RUE Sensation: WNL RUE Coordination: decreased fine motor;decreased gross motor LUE Deficits / Details: ~ 0-65 degrees AROM shoulder   Lower Extremity Assessment Lower Extremity Assessment: Defer to PT evaluation        Vision   Additional Comments: baseline  bluriness         Communication Communication Communication: Impaired Factors Affecting Communication: Reduced clarity of speech   Cognition Arousal: Alert Behavior During Therapy: WFL for tasks assessed/performed Cognition: No apparent impairments                               Following commands: Intact        Cueing   Cueing Techniques: Verbal cues, Tactile cues  Exercises Exercises: Other exercises (ankle pumps and foam ball squeezes 10 reps Bly q hr; attempted to begin pendulums but cares needed for Foley and patient adamantly requested return visit to ensure he was able to focus on training)       General Comments educated on elevation of LE's and heel floating with socks changed to bari size    Pertinent Vitals/ Pain       Pain Assessment Pain Assessment: 0-10 Pain Score: 2  Pain Location: B shoulders and LE's Pain Descriptors / Indicators: Aching, Sore Pain Intervention(s): Limited activity within patient's tolerance, Monitored during session, Repositioned   Frequency  Min 2X/week        Progress Toward Goals  OT Goals(current goals can now be found in the care plan section)  Progress towards OT goals: Progressing toward goals  Acute Rehab OT Goals Patient Stated Goal: to learn how to do more to help myself OT Goal Formulation: With patient Time For Goal Achievement: 08/30/23 Potential to Achieve Goals: Good ADL Goals Pt Will Perform Lower Body Bathing: with contact guard assist;with adaptive equipment;sit to/from stand Pt Will Perform Lower Body Dressing: with contact guard assist;with adaptive equipment;sit to/from stand Pt Will Transfer to Toilet: with contact guard assist;ambulating;grab bars;bedside commode Pt Will Perform Toileting - Clothing Manipulation and hygiene: with min assist;sit to/from stand Pt Will Perform Tub/Shower Transfer: with min assist;ambulating;shower seat Pt/caregiver will Perform Home Exercise Program: Increased  ROM;Both right and left upper extremity;With written HEP provided;With Supervision  Plan         AM-PAC OT 6 Clicks Daily Activity     Outcome Measure   Help from another person eating meals?: None Help from another person taking care of personal grooming?: A Little Help from another person toileting, which includes using toliet, bedpan, or urinal?: A Lot Help from another person bathing (including washing, rinsing, drying)?: A Lot Help from another person to put on and taking off regular upper body clothing?: A Little Help from another person to put on and taking off regular lower body clothing?: A Lot 6 Click Score: 16    End of Session Equipment Utilized During Treatment: Gait belt;Rolling walker (2 wheels)  OT Visit Diagnosis: Unsteadiness on feet (R26.81);Other abnormalities of gait and mobility (R26.89);Muscle weakness (generalized) (M62.81)   Activity Tolerance Patient tolerated treatment well   Patient Left in chair;with call bell/phone within reach;with chair alarm set   Nurse Communication Mobility status        Time: 1220-1240 OT Time Calculation (min): 20 min  Charges: OT General Charges $OT Visit: 1 Visit OT Treatments $Therapeutic Activity: 8-22 mins  Jerard Bays OT/L Acute Rehabilitation Department  [phone]  08/17/2023, 3:38 PM

## 2023-08-17 NOTE — Progress Notes (Signed)
 Occupational Therapy Treatment Patient Details Name: Edgar MAVIS Sr. MRN: [id number] DOB: 05-10-1935 Today's Date: 08/17/2023   History of present illness Edgar STROTHER Sr. is a 88 y.o. male with hx of chronic Foley catheter for urinary retention, TIA, anxiety, debility, recent admission from 7/11-17 with complicated UTI/catheter associated UTI discharged to SNF. He returns with generalized weakness concern about his safety at home   OT comments  Patient seen for skilled OT session focussed on B UE HEP progression. OT issued and trained in scap retraction,  pendulums and lap slides with handouts provided with photos to assist with carryover due to low vision for written info. Completed basic mobility back to bed with emphasis on safety and LE positioning due to edema Bly. Patient requires continued Acute care hospital level OT services to progress safety and functional performance and allow for discharge. HHOT recommended with family assist and support when able to discharge from hospital setting.        If plan is discharge home, recommend the following:  A lot of help with walking and/or transfers;A lot of help with bathing/dressing/bathroom;Assistance with cooking/housework;Assist for transportation;Help with stairs or ramp for entrance   Equipment Recommendations  Other (comment) (lift chair patient will discuss with son to obtain)       Precautions / Restrictions Precautions Precautions: Fall Recall of Precautions/Restrictions: Intact Restrictions Weight Bearing Restrictions Per Provider Order: No       Mobility Bed Mobility Overal bed mobility: Needs Assistance Bed Mobility: Sit to Supine     Supine to sit: Contact guard     General bed mobility comments: cues and increased time for LE management and to move up onto pillows for elevation    Transfers Overall transfer level: Needs assistance Equipment used: Rolling walker (2 wheels) Transfers: Sit to/from Stand, Bed  to chair/wheelchair/BSC Sit to Stand: Min assist, From elevated surface     Step pivot transfers: Min assist, From elevated surface     General transfer comment: moved reclienr to bed after sitting oob > 2 hrs     Balance Overall balance assessment: Needs assistance Sitting-balance support: Feet supported Sitting balance-Leahy Scale: Fair   Postural control: Posterior lean Standing balance support: Bilateral upper extremity supported, Reliant on assistive device for balance Standing balance-Leahy Scale: Poor Standing balance comment: Reliant on BUE suport                           ADL       See 1st session for details   Extremity/Trunk Assessment Upper Extremity Assessment Upper Extremity Assessment: Right hand dominant;Generalized weakness;RUE deficits/detail;LUE deficits/detail RUE: Shoulder pain with ROM (see below re response to light HEP) LUE: Shoulder pain with ROM   Lower Extremity Assessment Lower Extremity Assessment: Defer to PT evaluation                 Communication Communication Communication: Impaired Factors Affecting Communication: Reduced clarity of speech   Cognition Arousal: Alert Behavior During Therapy: WFL for tasks assessed/performed Cognition: No apparent impairments                               Following commands: Intact        Cueing   Cueing Techniques: Verbal cues, Tactile cues  Exercises Exercises: Other exercises (Issued lap slides and pendulum handout with B UE ROM 10 reps of each with min A to perform in  supported standing and sitting EOB, rec daily for improved ROM, addeed scap retraction 10 reps Bly)       General Comments reinforced elevation on pillows in bed as well as up in recliner    Pertinent Vitals/ Pain       Pain Assessment Pain Assessment: 0-10 Pain Score: 2  Pain Location: B shoulders and LE's Pain Descriptors / Indicators: Aching, Sore Pain Intervention(s): Limited activity within  patient's tolerance, Monitored during session, Repositioned   Frequency  Min 2X/week        Progress Toward Goals  OT Goals(current goals can now be found in the care plan section)  Progress towards OT goals: Progressing toward goals  Acute Rehab OT Goals Patient Stated Goal: to use my arms again OT Goal Formulation: With patient Time For Goal Achievement: 08/30/23 Potential to Achieve Goals: Good ADL Goals Pt Will Perform Lower Body Bathing: with contact guard assist;with adaptive equipment;sit to/from stand Pt Will Perform Lower Body Dressing: with contact guard assist;with adaptive equipment;sit to/from stand Pt Will Transfer to Toilet: with contact guard assist;ambulating;grab bars;bedside commode Pt Will Perform Toileting - Clothing Manipulation and hygiene: with min assist;sit to/from stand Pt Will Perform Tub/Shower Transfer: with min assist;ambulating;shower seat Pt/caregiver will Perform Home Exercise Program: Increased ROM;Both right and left upper extremity;With written HEP provided;With Supervision   AM-PAC OT 6 Clicks Daily Activity     Outcome Measure   Help from another person eating meals?: None Help from another person taking care of personal grooming?: A Little Help from another person toileting, which includes using toliet, bedpan, or urinal?: A Lot Help from another person bathing (including washing, rinsing, drying)?: A Lot Help from another person to put on and taking off regular upper body clothing?: A Little Help from another person to put on and taking off regular lower body clothing?: A Lot 6 Click Score: 16    End of Session Equipment Utilized During Treatment: Gait belt;Rolling walker (2 wheels)  OT Visit Diagnosis: Unsteadiness on feet (R26.81);Other abnormalities of gait and mobility (R26.89);Muscle weakness (generalized) (M62.81)   Activity Tolerance Patient tolerated treatment well   Patient Left with call bell/phone within reach;in  bed;with bed alarm set   Nurse Communication Mobility status        Time: 1410-1430 OT Time Calculation (min): 20 min  Charges: OT General Charges $OT Visit: 1 Visit OT Treatments $Therapeutic Exercise: 8-22 mins  Tiernan Millikin OT/L Acute Rehabilitation Department  [phone]  08/17/2023, 4:34 PM

## 2023-08-17 NOTE — Plan of Care (Signed)

## 2023-08-17 NOTE — Progress Notes (Addendum)
 PROGRESS NOTE    Edgar WENIGER Sr.  FMW:[id number] DOB: 08/13/1935 DOA: 08/15/2023 PCP: [facility]   Brief Narrative:  88 y.o. 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 He returns with generalized weakness concern about his safety at home. Patient provides limited information but feels generally weak, denies any recent fall. No focal weakness or numbness, speech change, vision change, HA. He has discomfort which he attributes to his foley. No fever.    He was brought to the ER with complaints of abdominal pain, his catheter bag leaking, back pain shoulder pain and left leg pain.  He was admitted to the hospital 7/11 through 7/17 with complicated UTI.  Urine culture at that time grew Proteus and procidentia.  He was treated with Rocephin  and changed to Bactrim  to complete 7 days of therapy.  His Foley catheter was changed on 07/25/2023.  He had some leaking around the catheter even at that time, there was no evidence of urinary retention.  He has had multiple ED visits due to Foley issues. UA this admission shows large amount of leukocytes 30 protein rare bacteria more than 50 WBC.  Assessment & Plan:   Principal Problem:   Generalized weakness Active Problems:   Recurrent UTI   Recurrent UTI, catheter associated with chronic foley   Urine Culture Proteus and providencia sensitive to Rocephin  7/25 Current culture pending per lab Continue rocephin    Debility, generalized weakness -PT/OT evaluation home health PT -DC home tomorrow   Chronic medical problems: TIA: Not on medication Anxiety: Not on medication    Estimated body mass index is 34.05 kg/m as calculated from the following:   Height as of this encounter: 5' 9 (1.753 m).   Weight as of this encounter:  104.6 kg.  DVT prophylaxis: lovenox  Code Status: full Family Communication:  Disposition Plan:  Status is: Observation The patient remains OBS appropriate and will d/c before 2 midnights.   Consultants: none  Procedures: none Antimicrobials: rocephin   Subjective:  Foley in place draining clear urine No overnight events   Objective: Vitals:   08/16/23 0749 08/16/23 1221 08/16/23 1938 08/17/23 0530  BP: 126/62 (!) 117/59 137/70 119/62  Pulse: (!) 55 (!) 58 70 66  Resp: 16 16 18 18   Temp: 98.7 F (37.1 C) 98.1 F (36.7 C) 97.8 F (36.6 C) 98 F (36.7 C)  TempSrc: Oral  Oral Oral  SpO2: 100% 100% 96% 99%  Weight:      Height:        Intake/Output Summary (Last 24 hours) at 08/17/2023 1011 Last data filed at 08/16/2023 1849 Gross per 24 hour  Intake 240 ml  Output 550 ml  Net -310 ml   Filed Weights   08/15/23 1543  Weight: 104.6 kg    Examination:  General exam: Appears in no acute distress Respiratory system: Clear to auscultation. Respiratory effort normal. Cardiovascular system: Regular  gastrointestinal system: Abdomen is nondistended, soft and nontender. No organomegaly or masses felt. Normal bowel sounds heard. Central nervous system: Alert and oriented.  Extremities: Trace edema bilaterally    Data Reviewed: I have personally reviewed following labs and imaging studies  CBC: Recent Labs  Lab 08/15/23 1814  WBC 8.0  NEUTROABS 4.8  HGB 13.3  HCT 42.7  MCV 87.3  PLT 206   Basic Metabolic Panel: Recent  Labs  Lab 08/15/23 1814  NA 144  K 4.0  CL 106  CO2 25  GLUCOSE 97  BUN 21  CREATININE 0.76  CALCIUM  9.9   GFR: Estimated Creatinine Clearance: 79 mL/min (by C-G formula based on SCr of 0.76 mg/dL). Liver Function Tests: Recent Labs  Lab 08/15/23 1814  AST 23  ALT 25  ALKPHOS 60  BILITOT 0.6  PROT 7.7  ALBUMIN 3.7   No results for input(s): LIPASE, AMYLASE in the last 168 hours. No results for input(s): AMMONIA in the  last 168 hours. Coagulation Profile: No results for input(s): INR, PROTIME in the last 168 hours. Cardiac Enzymes: No results for input(s): CKTOTAL, CKMB, CKMBINDEX, TROPONINI in the last 168 hours. BNP (last 3 results) No results for input(s): PROBNP in the last 8760 hours. HbA1C: No results for input(s): HGBA1C in the last 72 hours. CBG: No results for input(s): GLUCAP in the last 168 hours. Lipid Profile: No results for input(s): CHOL, HDL, LDLCALC, TRIG, CHOLHDL, LDLDIRECT in the last 72 hours. Thyroid Function Tests: Recent Labs    08/16/23 0037  TSH 3.439   Anemia Panel: No results for input(s): VITAMINB12, FOLATE, FERRITIN, TIBC, IRON , RETICCTPCT in the last 72 hours. Sepsis Labs: No results for input(s): PROCALCITON, LATICACIDVEN in the last 168 hours.  No results found for this or any previous visit (from the past 240 hours).       Radiology Studies: CT ABDOMEN PELVIS W CONTRAST Result Date: 08/15/2023 CLINICAL DATA:  Acute abdominal pain.  Leaking catheter. EXAM: CT ABDOMEN AND PELVIS WITH CONTRAST TECHNIQUE: Multidetector CT imaging of the abdomen and pelvis was performed using the standard protocol following bolus administration of intravenous contrast. RADIATION DOSE REDUCTION: This exam was performed according to the departmental dose-optimization program which includes automated exposure control, adjustment of the mA and/or kV according to patient size and/or use of iterative reconstruction technique. CONTRAST:  OMNIPAQUE  IOHEXOL  300 MG/ML  SOLN COMPARISON:  CT 07/21/2023 FINDINGS: Lower chest: Lower lobe bronchial thickening without confluent airspace disease. Dependent atelectasis. Hepatobiliary: No focal hepatic abnormality. Layering gallstones in the gallbladder. No pericholecystic inflammation. No biliary dilatation or choledocholithiasis. Pancreas: No ductal dilatation or inflammation. Spleen: Normal in size  without focal abnormality. Adrenals/Urinary Tract: No adrenal nodule. No hydronephrosis. No evidence of renal inflammation. No renal calculi. Decompressed ureters. Foley catheter decompresses urinary bladder. Circumferential bladder wall thickening, with mild perivesicular fat stranding bladder. Stomach/Bowel: Small hiatal hernia. Stomach is nondistended. No small bowel obstruction or inflammation. Moderate colonic stool burden. No colonic inflammatory change. Occasional colonic diverticula without diverticulitis. Vascular/Lymphatic: Aortic and branch atherosclerosis. No acute vascular findings. No abdominopelvic adenopathy. Reproductive: Marked prostatic enlargement causing mass effect on the bladder base. Other: No ascites or free air. Small fat containing left inguinal hernia. Musculoskeletal: There are no acute or suspicious osseous abnormalities. Degenerative change throughout the spine. IMPRESSION: 1. Foley catheter decompresses urinary bladder. Circumferential bladder wall thickening with mild perivesicular fat stranding, can be seen with chronic bladder outlet obstruction or cystitis. 2. Marked prostatic enlargement causing mass effect on the bladder base. 3. Cholelithiasis without CT findings of acute cholecystitis. 4. Small hiatal hernia. Aortic Atherosclerosis (ICD10-I70.0). Electronically Signed   By: Andrea Gasman M.D.   On: 08/15/2023 21:20   MR Lumbar Spine W Wo Contrast Result Date: 08/15/2023 EXAM: MR Lumbar Spine with and without intravenous contrast. 08/15/2023 06:40:27 PM TECHNIQUE: Multiplanar multisequence MRI of the lumbar spine was performed with and without the administration of intravenous contrast. COMPARISON: None available  CLINICAL HISTORY: Low back pain, cauda equina syndrome suspected. Pain abd, back, right shoulder, left leg for several weeks now. Has had reduced gait over 3 weeks. FINDINGS: BONES AND ALIGNMENT: Lumbar lordosis is maintained. No listhesis. No bone marrow edema or  evidence of fracture. Vertebral body heights are maintained. No suspicious osseous lesion or abnormal enhancement. SPINAL CORD: The conus medullaris terminates at the L1-2 level. SOFT TISSUES: The paraspinal soft tissues are unremarkable. L1-L2: There is mild spinal canal stenosis and thickening of the ligaments and plenum without significant spinal canal or foraminal stenosis. L2-L3: There is a diffuse disc bulge resulting in lateral recess narrowing. Moderate facet arthrosis, thickening of the ligaments and plenum, and dorsal epidural lipomatosis with moderate spinal canal stenosis. There is mild bilateral foraminal stenosis. L3-L4: There is a diffuse disc bulge resulting in lateral recess narrowing, slightly greater on the right. Moderate facet arthrosis, thickening of the ligamentum flavum, and epidural lipomatosis resulting in mild spinal canal stenosis. There is mild foraminal stenosis on the right. L4-L5: There is diffuse disc bulge and mild disc height loss with lateral recess narrowing. Moderate facet arthrosis and thickening of the ligamentum flavum along with epidural lipomatosis resulting in moderate to severe spinal canal stenosis. There is moderate-to-severe right and moderate left foraminal stenosis. L5-S1: There is a diffuse disc bulge with lateral recess narrowing. Moderate facet arthrosis and epidural lipomatosis. Mild spinal canal stenosis. There is severe left and mild right foraminal stenosis. IMPRESSION: 1. 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 There is moderate-to-severe spinal canal stenosis at L4-5 and moderate spinal canal stenosis at L2-3. 2. Multilevel foraminal stenosis, greatest and severe on the left at L5-S1. Additional moderate-to-severe right and moderate left foraminal stenosis at L4-5. Electronically signed by: Donnice Mania MD 08/15/2023 08:29 PM EDT RP Workstation: [id number]      Scheduled Meds:  enoxaparin  (LOVENOX ) injection  40 mg Subcutaneous Q24H   pneumococcal 20-valent conjugate vaccine  0.5 mL Intramuscular Tomorrow-1000   Continuous Infusions:  cefTRIAXone  (ROCEPHIN )  IV 1 g (08/16/23 2133)     LOS: 0 days    Almarie KANDICE Hoots, MD 08/17/2023, 10:11 AM

## 2023-08-17 NOTE — TOC Progression Note (Signed)
 Transition of Care (TOC) - Progression Note    Patient Details  Name: Edgar WAIDE Sr. MRN: [id number] Date of Birth: 23-Apr-1935  Transition of Care [facility]) CM/SW Contact  Doneta Glenys DASEN, RN Phone Number: 08/17/2023, 1:51 PM  Clinical Narrative:    CM spoke with patient' son Edgar Mckay. Explained PT reccommended for [facility] PT/OT/Nurse. Patient and son agreeable to [facility] and requested [facility] since had previous service with them. CM contacted [facility] and have accepted patient. Information will be added to AVS.  Barriers to Discharge: Continued Medical Work up   Expected Discharge Plan and Services In-house Referral: NA Discharge Planning Services: CM Consult Post Acute Care Choice: NA Living arrangements for the past 2 months: Single Family Home                 DME Arranged: N/A DME Agency: NA       HH Arranged: RN, PT, OT HH Agency: [facility] Date HH Agency Contacted: 08/17/23 Time HH Agency Contacted: 1345 Representative spoke with at [facility] Agency: Contractor   Social Drivers of Health (SDOH) Interventions SDOH Screenings   Food Insecurity: No Food Insecurity (08/16/2023)  Housing: Low Risk  (08/16/2023)  Transportation Needs: No Transportation Needs (08/16/2023)  Utilities: Not At Risk (08/16/2023)  Depression (PHQ2-9): Low Risk  (03/25/2021)  Social Connections: Unknown (08/16/2023)  Recent Concern: Social Connections - Socially Isolated (08/16/2023)  Tobacco Use: High Risk (08/15/2023)    Readmission Risk Interventions     No data to display

## 2023-08-17 NOTE — Progress Notes (Signed)
 Mobility Specialist - Progress Note   08/17/23 0958  Mobility  Activity Ambulated with assistance  Level of Assistance Minimal assist, patient does 75% or more  Assistive Device Front wheel walker  Distance Ambulated (ft) 16 ft  Range of Motion/Exercises Active  Activity Response Tolerated well  Mobility Referral Yes  Mobility visit 1 Mobility  Mobility Specialist Start Time (ACUTE ONLY) 0940  Mobility Specialist Stop Time (ACUTE ONLY) [id number]  Mobility Specialist Time Calculation (min) (ACUTE ONLY) 18 min   Pt was found in bed and agreeable to mobilize. Grew fatigued and at EOS returned to bed with all needs met. Call bell in reach.  Erminio Leos,  Mobility Specialist Can be reached via Secure Chat

## 2023-08-18 DIAGNOSIS — R531 Weakness: Secondary | ICD-10-CM | POA: Diagnosis not present

## 2023-08-18 MED ORDER — CHLORHEXIDINE GLUCONATE CLOTH 2 % EX PADS: 6.0000 | MEDICATED_PAD | Freq: Every day | CUTANEOUS | Status: DC

## 2023-08-18 MED ORDER — ACETAMINOPHEN 500 MG PO TABS: 1000.0000 mg | ORAL_TABLET | Freq: Four times a day (QID) | ORAL | 0 refills | Status: DC | PRN

## 2023-08-18 MED ORDER — POLYETHYLENE GLYCOL 3350 17 G PO PACK: 17.0000 g | PACK | Freq: Every day | ORAL | 0 refills | Status: DC | PRN

## 2023-08-18 NOTE — Progress Notes (Signed)
 Called and contacted son and he will pick patient up at 430PM

## 2023-08-18 NOTE — Plan of Care (Signed)

## 2023-08-18 NOTE — Discharge Summary (Signed)
 Physician Discharge Summary  Edgar DIBIASIO Sr. FMW:[id number] DOB: April 23, 1935 DOA: 08/15/2023  PCP: [facility]  Admit date: 08/15/2023 Discharge date: 08/18/2023  Admitted From: home Disposition: home  Recommendations for Outpatient Follow-up:  Follow up with PCP in 1-2 weeks Please obtain BMP/CBC in one week  Home Health:yes Equipment/Devices:none Discharge Condition:stable CODE STATUS:full Diet recommendation:cardiac   Brief/Interim Summary:    88 y.o. 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 He returns with generalized weakness concern about his safety at home. Patient provides limited information but feels generally weak, denies any recent fall. No focal weakness or numbness, speech change, vision change, HA. He has discomfort which he attributes to his foley. No fever.     He was brought to the ER with complaints of abdominal pain, his catheter bag leaking, back pain shoulder pain and left leg pain.  He was admitted to the hospital 7/11 through 7/17 with complicated UTI.  Urine culture at that time grew Proteus and procidentia.  He was treated with Rocephin  and changed to Bactrim  to complete 7 days of therapy.  His Foley catheter was changed on 07/25/2023.  He had some leaking around the catheter even at that time, there was no evidence of urinary retention.  He has had multiple ED visits due to Foley issues. UA this admission shows large amount of leukocytes 30 protein rare bacteria more than 50 WBC.     Discharge Diagnoses:  Principal Problem:   Generalized weakness Active Problems:   Recurrent UTI    Recurrent UTI, catheter associated with chronic foley   Urine Culture Proteus and providencia sensitive to Rocephin  7/25 He got rocephin  for 3 days  I  dont see a urine culture from this admit on dc.   Debility, generalized weakness -PT/OT evaluation  recommended SNF.HE REFUSED. -DC home WITH HH   Chronic medical problems: TIA: Not on medication Anxiety: Not on medication   Estimated body mass index is 34.05 kg/m as calculated from the following:   Height as of this encounter: 5' 9 (1.753 m).   Weight as of this encounter: 104.6 kg.  Discharge Instructions  Discharge Instructions     Diet - low sodium heart healthy   Complete by: As directed    Increase activity slowly   Complete by: As directed       Allergies as of 08/18/2023       Reactions   Citrus Anaphylaxis   Swelling of mouth, cannot swallow.    Shellfish Allergy Swelling   Swelling of tongue         Medication List     TAKE these medications    acetaminophen  500 MG tablet Commonly known as: TYLENOL  Take 2 tablets (1,000 mg total) by mouth every 6 (six) hours as needed for mild pain (pain score 1-3).   ALLERGY RELIEF PO Take 1 tablet by mouth daily.   cyanocobalamin  1000 MCG tablet Take 1 tablet (1,000 mcg total) by mouth daily for Vitamin Supplement. What changed: additional instructions   ibuprofen  200 MG tablet Commonly known as: ADVIL  Take 1 tablet (200 mg total) by mouth every 6 (six) hours as needed for fever, headache, mild pain (pain score 1-3) or cramping. What changed:  how much to take when to take this reasons to take this   MUCUS RELIEF PO Take 2  tablets by mouth in the morning.   MULTIVITAMIN ADULT PO Take 1 each by mouth in the morning. Gummies   polyethylene glycol 17 g packet Commonly known as: MIRALAX  / GLYCOLAX  Take 17 g by mouth daily as needed for mild constipation.        Follow-up Information     [facility].   Specialty: Home Health Services Why: Also known as [facility] [address], [place], [place] [address] Phone: [phone] Will call to schedule  appointment for Physical / Occupational Therapy and Nurse.        [facility] Follow up.   Contact information: [address] [place] [address] [phone]                Allergies  Allergen Reactions   Citrus Anaphylaxis    Swelling of mouth, cannot swallow.    Shellfish Allergy Swelling    Swelling of tongue     Consultations:none   Procedures/Studies: CT ABDOMEN PELVIS W CONTRAST Result Date: 08/15/2023 CLINICAL DATA:  Acute abdominal pain.  Leaking catheter. EXAM: CT ABDOMEN AND PELVIS WITH CONTRAST TECHNIQUE: Multidetector CT imaging of the abdomen and pelvis was performed using the standard protocol following bolus administration of intravenous contrast. RADIATION DOSE REDUCTION: This exam was performed according to the departmental dose-optimization program which includes automated exposure control, adjustment of the mA and/or kV according to patient size and/or use of iterative reconstruction technique. CONTRAST:  OMNIPAQUE  IOHEXOL  300 MG/ML  SOLN COMPARISON:  CT 07/21/2023 FINDINGS: Lower chest: Lower lobe bronchial thickening without confluent airspace disease. Dependent atelectasis. Hepatobiliary: No focal hepatic abnormality. Layering gallstones in the gallbladder. No pericholecystic inflammation. No biliary dilatation or choledocholithiasis. Pancreas: No ductal dilatation or inflammation. Spleen: Normal in size without focal abnormality. Adrenals/Urinary Tract: No adrenal nodule. No hydronephrosis. No evidence of renal inflammation. No renal calculi. Decompressed ureters. Foley catheter decompresses urinary bladder. Circumferential bladder wall thickening, with mild perivesicular fat stranding bladder. Stomach/Bowel: Small hiatal hernia. Stomach is nondistended. No small bowel obstruction or inflammation. Moderate colonic stool burden. No colonic inflammatory change. Occasional colonic diverticula without diverticulitis.  Vascular/Lymphatic: Aortic and branch atherosclerosis. No acute vascular findings. No abdominopelvic adenopathy. Reproductive: Marked prostatic enlargement causing mass effect on the bladder base. Other: No ascites or free air. Small fat containing left inguinal hernia. Musculoskeletal: There are no acute or suspicious osseous abnormalities. Degenerative change throughout the spine. IMPRESSION: 1. Foley catheter decompresses urinary bladder. Circumferential bladder wall thickening with mild perivesicular fat stranding, can be seen with chronic bladder outlet obstruction or cystitis. 2. Marked prostatic enlargement causing mass effect on the bladder base. 3. Cholelithiasis without CT findings of acute cholecystitis. 4. Small hiatal hernia. Aortic Atherosclerosis (ICD10-I70.0). Electronically Signed   By: Andrea Gasman M.D.   On: 08/15/2023 21:20   MR Lumbar Spine W Wo Contrast Result Date: 08/15/2023 EXAM: MR Lumbar Spine with and without intravenous contrast. 08/15/2023 06:40:27 PM TECHNIQUE: Multiplanar multisequence MRI of the lumbar spine was performed with and without the administration of intravenous contrast. COMPARISON: None available CLINICAL HISTORY: Low back pain, cauda equina syndrome suspected. Pain abd, back, right shoulder, left leg for several weeks now. Has had reduced gait over 3 weeks. FINDINGS: BONES AND ALIGNMENT: Lumbar lordosis is maintained. No listhesis. No bone marrow edema or evidence of fracture. Vertebral body heights are maintained. No suspicious osseous lesion or abnormal enhancement. SPINAL CORD: The conus medullaris terminates at the L1-2 level. SOFT TISSUES: The paraspinal  soft tissues are unremarkable. L1-L2: There is mild spinal canal stenosis and thickening of the ligaments and plenum without significant spinal canal or foraminal stenosis. L2-L3: There is a diffuse disc bulge resulting in lateral recess narrowing. Moderate facet arthrosis, thickening of the ligaments and  plenum, and dorsal epidural lipomatosis with moderate spinal canal stenosis. There is mild bilateral foraminal stenosis. L3-L4: There is a diffuse disc bulge resulting in lateral recess narrowing, slightly greater on the right. Moderate facet arthrosis, thickening of the ligamentum flavum, and epidural lipomatosis resulting in mild spinal canal stenosis. There is mild foraminal stenosis on the right. L4-L5: There is diffuse disc bulge and mild disc height loss with lateral recess narrowing. Moderate facet arthrosis and thickening of the ligamentum flavum along with epidural lipomatosis resulting in moderate to severe spinal canal stenosis. There is moderate-to-severe right and moderate left foraminal stenosis. L5-S1: There is a diffuse disc bulge with lateral recess narrowing. Moderate facet arthrosis and epidural lipomatosis. Mild spinal canal stenosis. There is severe left and mild right foraminal stenosis. IMPRESSION: 1. 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 There is moderate-to-severe spinal canal stenosis at L4-5 and moderate spinal canal stenosis at L2-3. 2. Multilevel foraminal stenosis, greatest and severe on the left at L5-S1. Additional moderate-to-severe right and moderate left foraminal stenosis at L4-5. Electronically signed by: Donnice Mania MD 08/15/2023 08:29 PM EDT RP Workstation: [id number]   CT Angio Chest/Abd/Pel for Dissection W and/or W/WO Result Date: 07/21/2023 CLINICAL DATA:  Thoracoabdominal aortic aneurysm, pulmonary embolism, upper chest pain, unspecified abdominal pain. EXAM: CT ANGIOGRAPHY CHEST, ABDOMEN AND PELVIS TECHNIQUE: Non-contrast CT of the chest was initially obtained. Multidetector CT imaging through the chest, abdomen and pelvis was performed using the standard protocol during bolus administration of intravenous contrast. Multiplanar reconstructed images and MIPs were obtained  and reviewed to evaluate the vascular anatomy. RADIATION DOSE REDUCTION: This exam was performed according to the departmental dose-optimization program which includes automated exposure control, adjustment of the mA and/or kV according to patient size and/or use of iterative reconstruction technique. CONTRAST:  OMNIPAQUE  IOHEXOL  350 MG/ML SOLN COMPARISON:  CT abdomen pelvis 11/30/2020 FINDINGS: CTA CHEST FINDINGS Cardiovascular: Moderate left anterior descending coronary artery calcification. Global cardiac size within normal limits. No pericardial effusion. Adequate opacification of the pulmonary arterial tree. No intraluminal filling defect identified to suggest acute or chronic pulmonary embolus. Central pulmonary arteries are of normal caliber. Thoracic aorta is normal in course and caliber. No intramural hematoma, dissection, or aneurysm. Mild atherosclerotic calcification within the thoracic aorta. Mediastinum/Nodes: No enlarged mediastinal, hilar, or axillary lymph nodes. Thyroid gland, trachea, and esophagus demonstrate no significant findings. Small hiatal hernia Lungs/Pleura: Respiratory motion artifact. No confluent pulmonary infiltrate. No pneumothorax or pleural effusion. No central obstructing lesion. Musculoskeletal: No chest wall abnormality. No acute or significant osseous findings. Review of the MIP images confirms the above findings. CTA ABDOMEN AND PELVIS FINDINGS VASCULAR Aorta: The abdominal aorta is normal in course and caliber. No intramural hematoma, dissection, or aneurysm. Mild atherosclerotic calcification Celiac: Patent without evidence of aneurysm, dissection, vasculitis or significant stenosis. SMA: Patent without evidence of aneurysm, dissection, vasculitis or significant stenosis. Renals: Both renal arteries are patent without evidence of aneurysm, dissection, vasculitis, fibromuscular dysplasia or significant stenosis. IMA: Accessory left lower pole renal artery. Inflow:  Patent without evidence of aneurysm, dissection, vasculitis or significant stenosis. Veins: No obvious venous abnormality within the limitations of this arterial phase study. Review of  the MIP images confirms the above findings. NON-VASCULAR Hepatobiliary: Cholelithiasis without superimposed pericholecystic inflammatory change. Liver unremarkable on this arterial phase examination. No intra or extrahepatic biliary ductal dilation. Pancreas: Unremarkable Spleen: Unremarkable Adrenals/Urinary Tract: The adrenal glands are unremarkable. The kidneys are normal. The bladder is thick walled, suggesting changes chronic bladder outlet obstruction. Foley catheter balloon is seen within a decompressed bladder lumen Stomach/Bowel: Moderate colonic stool burden. Mild ascending colonic diverticulosis. Stomach, small bowel, and large bowel are otherwise unremarkable. No evidence of obstruction or focal inflammation. Appendix is normal. No free intraperitoneal gas or fluid. Lymphatic: No pathologic adenopathy within the abdomen and pelvis. Reproductive: Marked prostatic hypertrophy Other: Small fat containing left inguinal hernia. Musculoskeletal: No acute bone abnormality. No lytic or blastic bone lesion. Osseous structures are age appropriate. Review of the MIP images confirms the above findings. IMPRESSION: 1. No evidence of acute aortic syndrome. 2. Moderate left anterior descending coronary artery calcification. 3. No pulmonary embolism identified. 4. Small hiatal hernia. 5. Cholelithiasis. 6. Mild ascending colonic diverticulosis. 7. Marked prostatic hypertrophy with bladder wall thickening in keeping with changes of chronic bladder outlet obstruction. Foley catheter balloon is seen within a decompressed bladder lumen. 8. Small fat containing left inguinal hernia. Aortic Atherosclerosis (ICD10-I70.0). Electronically Signed   By: Dorethia Molt M.D.   On: 07/21/2023 21:19   DG Cervical Spine 2 or 3 views Result Date:  07/21/2023 CLINICAL DATA:  Fall, neck pain EXAM: CERVICAL SPINE - 2-3 VIEW COMPARISON:  None Available. FINDINGS: Cervical spine is visualized from the occiput through C5-6 on lateral examination. C6-T1 is obscured by overlying soft tissues. Mild straightening the visualized cervical spine. The examination is slightly limited by obliquity, however, no acute fracture or listhesis of the visualized cervical spine. Vertebral body heights are preserved. Mild disc space narrowing and endplate remodeling at C4-5 and C5-6 is present in keeping with changes of mild to moderate degenerative disc disease. Multilevel facet arthrosis is not well assessed on this examination. Prevertebral soft tissues are not thickened. IMPRESSION: 1. No acute fracture or listhesis of the visualized cervical spine. C6-T1 not visualized. Electronically Signed   By: Dorethia Molt M.D.   On: 07/21/2023 19:40   (Echo, Carotid, EGD, Colonoscopy, ERCP)    Subjective: Resting in bed no new events Foley in place draining clear urine  Discharge Exam: Vitals:   08/18/23 0351 08/18/23 1209  BP: 107/67 (!) 106/58  Pulse: 62 (!) 58  Resp: 20 18  Temp: 98.3 F (36.8 C) 98.4 F (36.9 C)  SpO2: 98% 100%   Vitals:   08/17/23 1226 08/17/23 2005 08/18/23 0351 08/18/23 1209  BP: 124/63 (!) 165/67 107/67 (!) 106/58  Pulse: 62 71 62 (!) 58  Resp: 18 18 20 18   Temp: 98 F (36.7 C) 98.2 F (36.8 C) 98.3 F (36.8 C) 98.4 F (36.9 C)  TempSrc: Oral Oral  Oral  SpO2: 100% 97% 98% 100%  Weight:      Height:        General: Pt is alert, awake, not in acute distress Cardiovascular: RRR, S1/S2 +, no rubs, no gallops Respiratory: CTA bilaterally, no wheezing, no rhonchi Abdominal: Soft, NT, ND, bowel sounds + Extremities: no edema, no cyanosis    The results of significant diagnostics from this hospitalization (including imaging, microbiology, ancillary and laboratory) are listed below for reference.     Microbiology: No results  found for this or any previous visit (from the past 240 hours).   Labs: BNP (last 3 results) No  results for input(s): BNP in the last 8760 hours. Basic Metabolic Panel: Recent Labs  Lab 08/15/23 1814  NA 144  K 4.0  CL 106  CO2 25  GLUCOSE 97  BUN 21  CREATININE 0.76  CALCIUM  9.9   Liver Function Tests: Recent Labs  Lab 08/15/23 1814  AST 23  ALT 25  ALKPHOS 60  BILITOT 0.6  PROT 7.7  ALBUMIN 3.7   No results for input(s): LIPASE, AMYLASE in the last 168 hours. No results for input(s): AMMONIA in the last 168 hours. CBC: Recent Labs  Lab 08/15/23 1814  WBC 8.0  NEUTROABS 4.8  HGB 13.3  HCT 42.7  MCV 87.3  PLT 206   Cardiac Enzymes: No results for input(s): CKTOTAL, CKMB, CKMBINDEX, TROPONINI in the last 168 hours. BNP: Invalid input(s): POCBNP CBG: No results for input(s): GLUCAP in the last 168 hours. D-Dimer No results for input(s): DDIMER in the last 72 hours. Hgb A1c No results for input(s): HGBA1C in the last 72 hours. Lipid Profile No results for input(s): CHOL, HDL, LDLCALC, TRIG, CHOLHDL, LDLDIRECT in the last 72 hours. Thyroid function studies Recent Labs    08/16/23 0037  TSH 3.439   Anemia work up No results for input(s): VITAMINB12, FOLATE, FERRITIN, TIBC, IRON , RETICCTPCT in the last 72 hours. Urinalysis    Component Value Date/Time   COLORURINE YELLOW 08/15/2023 1721   APPEARANCEUR HAZY (A) 08/15/2023 1721   LABSPEC 1.021 08/15/2023 1721   PHURINE 5.0 08/15/2023 1721   GLUCOSEU NEGATIVE 08/15/2023 1721   GLUCOSEU NEGATIVE 03/25/2021 1449   HGBUR MODERATE (A) 08/15/2023 1721   BILIRUBINUR NEGATIVE 08/15/2023 1721   KETONESUR NEGATIVE 08/15/2023 1721   PROTEINUR 30 (A) 08/15/2023 1721   UROBILINOGEN 1.0 03/25/2021 1449   NITRITE NEGATIVE 08/15/2023 1721   LEUKOCYTESUR LARGE (A) 08/15/2023 1721   Sepsis Labs Recent Labs  Lab 08/15/23 1814  WBC 8.0   Microbiology No results  found for this or any previous visit (from the past 240 hours).   Time coordinating discharge: 39 min SIGNED:   Almarie KANDICE Hoots, MD  [facility] 08/18/2023, 6:04 PM

## 2023-08-18 NOTE — TOC Transition Note (Signed)
 Transition of Care [facility]) - Discharge Note   Patient Details  Name: Edgar Mckay Sr. MRN: [id number] Date of Birth: September 19, 1935  Transition of [facility]) CM/SW Contact:  Doneta Glenys DASEN, RN Phone Number: 08/18/2023, 10:56 AM   Clinical Narrative:    Patient with discharge orders. No additional TOC needs.    Final next level of care: Home w Home Health Services Barriers to Discharge: Barriers Resolved   Patient Goals and CMS Choice Patient states their goals for this hospitalization and ongoing recovery are:: I dont know CMS Medicare.gov Compare Post Acute Care list provided to::  (NA) Choice offered to / list presented to : NA [place] ownership interest in [facility].provided to:: Parent NA    Discharge Placement                       Discharge Plan and Services Additional resources added to the After Visit Summary for   In-house Referral: NA Discharge Planning Services: CM Consult Post Acute Care Choice: NA          DME Arranged: N/A DME Agency: NA       HH Arranged: RN, PT, OT HH Agency: [facility] Date HH Agency Contacted: 08/17/23 Time HH Agency Contacted: 1345 Representative spoke with at [facility] Agency: [place]  Social Drivers of Health (SDOH) Interventions SDOH Screenings   Food Insecurity: No Food Insecurity (08/16/2023)  Housing: Low Risk  (08/16/2023)  Transportation Needs: No Transportation Needs (08/16/2023)  Utilities: Not At Risk (08/16/2023)  Depression (PHQ2-9): Low Risk  (03/25/2021)  Social Connections: Unknown (08/16/2023)  Recent Concern: Social Connections - Socially Isolated (08/16/2023)  Tobacco Use: High Risk (08/15/2023)     Readmission Risk Interventions     No data to display

## 2023-08-18 NOTE — Progress Notes (Signed)
 Pt discharged. Medications and referrals reviewed. Son contacted and educated about AVS and discharge instructions. All questions asked and answered. Pt/pt son verbalized understanding about discharge.  IV removed. All belongings returned to patient. Pt off unit in wheelchair with staff. No acute distress.

## 2023-08-18 NOTE — Progress Notes (Signed)
 Physical Therapy Treatment Patient Details Name: Edgar HOPFENSPERGER Sr. MRN: [id number] DOB: Dec 04, 1935 Today's Date: 08/18/2023   History of Present Illness Edgar BELLANCA Sr. is a 88 y.o. male with hx of chronic Foley catheter for urinary retention, TIA, anxiety, debility, recent admission from 7/11-17 with complicated UTI/catheter associated UTI discharged to SNF. He returns with generalized weakness concern about his safety at home    PT Comments   Pt admitted with above diagnosis.  Pt currently with functional limitations due to the deficits listed below (see PT Problem List). Pt in bed when PT arrived. Pt agreeable to therapy intervention. Pt states he is weaker today. Pt states he needs to be able to get up and go to the bathroom at home. Pt able to perform supine to sit with increased time, cues and S, sit to stand  from EOB with CGA and cues to RW, gait tasks in personal room 30 feet with RW and CGA with cues for posture and improved B foot placement and pt elected to return to bed and required min A for R LE to return to supine. Pt left in bed all needs in place. Pt will benefit from continued therapy with [facility] following d/c pt declined SNF/Rehab at evaluation.  Pt will benefit from acute skilled PT to increase their independence and safety with mobility to allow discharge.      If plan is discharge home, recommend the following: Help with stairs or ramp for entrance;Assist for transportation;Assistance with cooking/housework;A little help with walking and/or transfers;A little help with bathing/dressing/bathroom   Can travel by private vehicle     Yes  Equipment Recommendations  None recommended by PT (24/7 physical assist)    Recommendations for Other Services       Precautions / Restrictions Precautions Precautions: Fall Recall of Precautions/Restrictions: Intact Restrictions Weight Bearing Restrictions Per Provider Order: No     Mobility  Bed Mobility Overal bed mobility: Needs  Assistance Bed Mobility: Sit to Supine     Supine to sit: Supervision Sit to supine: Min assist   General bed mobility comments: cues and increased time for supine to sit with HOB slightly elevated and min A for R LE to return to flat bed    Transfers Overall transfer level: Needs assistance Equipment used: Rolling walker (2 wheels) Transfers: Sit to/from Stand, Bed to chair/wheelchair/BSC Sit to Stand: Contact guard assist           General transfer comment: min cues and pt reported height of hospital bed similar to one at home, min cues for power up and push to stand    Ambulation/Gait Ambulation/Gait assistance: Contact guard assist Gait Distance (Feet): 30 Feet Assistive device: Rolling walker (2 wheels) Gait Pattern/deviations: Wide base of support, Trunk flexed, Step-to pattern, Decreased step length - left, Decreased stance time - right, Shuffle Gait velocity: decreased     General Gait Details: cues for posture, proper distance from RW. safety with turns, pt amb with decreaed cadence, short stride length limited B foot clearance slight B toe out and reliance on B UE support at [facility] for balance pt able to modify gait pattern for 2-3 steps however unable to maintain   Stairs             Wheelchair Mobility     Tilt Bed    Modified Rankin (Stroke Patients Only)       Balance Overall balance assessment: Needs assistance Sitting-balance support: Feet supported Sitting balance-Leahy Scale: Good  Standing balance support: Bilateral upper extremity supported, Reliant on assistive device for balance Standing balance-Leahy Scale: Poor Standing balance comment: Reliant on BUE suport                            Communication Communication Communication: Impaired  Cognition Arousal: Alert Behavior During Therapy: WFL for tasks assessed/performed   PT - Cognitive impairments: No apparent impairments                          Following commands: Intact      Cueing    Exercises      General Comments        Pertinent Vitals/Pain Pain Assessment Pain Assessment: 0-10 Pain Score: 1  Pain Location: B shoulders and LE's Pain Descriptors / Indicators: Aching, Sore Pain Intervention(s): Limited activity within patient's tolerance, Monitored during session, Repositioned    Home Living                          Prior Function            PT Goals (current goals can now be found in the care plan section) Acute Rehab PT Goals Patient Stated Goal: get this foley straightened out PT Goal Formulation: With patient Time For Goal Achievement: 08/30/23 Potential to Achieve Goals: Fair Progress towards PT goals: Progressing toward goals    Frequency    Min 3X/week      PT Plan      Co-evaluation              AM-PAC PT 6 Clicks Mobility   Outcome Measure  Help needed turning from your back to your side while in a flat bed without using bedrails?: A Little Help needed moving from lying on your back to sitting on the side of a flat bed without using bedrails?: A Little Help needed moving to and from a bed to a chair (including a wheelchair)?: A Little Help needed standing up from a chair using your arms (e.g., wheelchair or bedside chair)?: A Little Help needed to walk in hospital room?: A Little Help needed climbing 3-5 steps with a railing? : Total 6 Click Score: 16    End of Session Equipment Utilized During Treatment: Gait belt Activity Tolerance: Patient limited by fatigue Patient left: in bed;with call bell/phone within reach Nurse Communication: Mobility status PT Visit Diagnosis: Unsteadiness on feet (R26.81);Other abnormalities of gait and mobility (R26.89);Muscle weakness (generalized) (M62.81);Difficulty in walking, not elsewhere classified (R26.2);Pain     Time: 8873-8852 PT Time Calculation (min) (ACUTE ONLY): 21 min  Charges:    $Gait Training: 8-22 mins PT  General Charges $$ ACUTE PT VISIT: 1 Visit                     [place], PT Acute Rehab    [place] VEAR Drone 08/18/2023, 2:34 PM

## 2023-08-20 ENCOUNTER — Emergency Department (HOSPITAL_COMMUNITY)

## 2023-08-20 ENCOUNTER — Encounter (HOSPITAL_COMMUNITY): Payer: Self-pay

## 2023-08-20 ENCOUNTER — Inpatient Hospital Stay (HOSPITAL_COMMUNITY)
Admission: EM | Admit: 2023-08-20 | Discharge: 2023-08-26 | DRG: 177 | Disposition: A | Discharge status: Hospice | Attending: Internal Medicine | Admitting: Internal Medicine

## 2023-08-20 ENCOUNTER — Other Ambulatory Visit: Payer: Self-pay

## 2023-08-20 DIAGNOSIS — Z91018 Allergy to other foods: Secondary | ICD-10-CM | POA: Diagnosis not present

## 2023-08-20 DIAGNOSIS — T83511A Infection and inflammatory reaction due to indwelling urethral catheter, initial encounter: Secondary | ICD-10-CM | POA: Diagnosis present

## 2023-08-20 DIAGNOSIS — Z87892 Personal history of anaphylaxis: Secondary | ICD-10-CM | POA: Diagnosis not present

## 2023-08-20 DIAGNOSIS — F419 Anxiety disorder, unspecified: Secondary | ICD-10-CM | POA: Diagnosis present

## 2023-08-20 DIAGNOSIS — Z515 Encounter for palliative care: Secondary | ICD-10-CM

## 2023-08-20 DIAGNOSIS — F17210 Nicotine dependence, cigarettes, uncomplicated: Secondary | ICD-10-CM | POA: Diagnosis present

## 2023-08-20 DIAGNOSIS — Y846 Urinary catheterization as the cause of abnormal reaction of the patient, or of later complication, without mention of misadventure at the time of the procedure: Secondary | ICD-10-CM | POA: Diagnosis present

## 2023-08-20 DIAGNOSIS — R609 Edema, unspecified: Secondary | ICD-10-CM | POA: Diagnosis not present

## 2023-08-20 DIAGNOSIS — Z8673 Personal history of transient ischemic attack (TIA), and cerebral infarction without residual deficits: Secondary | ICD-10-CM | POA: Diagnosis not present

## 2023-08-20 DIAGNOSIS — E66811 Obesity, class 1: Secondary | ICD-10-CM | POA: Diagnosis present

## 2023-08-20 DIAGNOSIS — R531 Weakness: Principal | ICD-10-CM

## 2023-08-20 DIAGNOSIS — Z8619 Personal history of other infectious and parasitic diseases: Secondary | ICD-10-CM

## 2023-08-20 DIAGNOSIS — N390 Urinary tract infection, site not specified: Secondary | ICD-10-CM | POA: Diagnosis present

## 2023-08-20 DIAGNOSIS — Z87440 Personal history of urinary (tract) infections: Secondary | ICD-10-CM

## 2023-08-20 DIAGNOSIS — Z66 Do not resuscitate: Secondary | ICD-10-CM | POA: Diagnosis not present

## 2023-08-20 DIAGNOSIS — B370 Candidal stomatitis: Secondary | ICD-10-CM | POA: Diagnosis present

## 2023-08-20 DIAGNOSIS — Z91013 Allergy to seafood: Secondary | ICD-10-CM

## 2023-08-20 DIAGNOSIS — Z635 Disruption of family by separation and divorce: Secondary | ICD-10-CM

## 2023-08-20 DIAGNOSIS — G8321 Monoplegia of upper limb affecting right dominant side: Secondary | ICD-10-CM | POA: Diagnosis present

## 2023-08-20 DIAGNOSIS — R627 Adult failure to thrive: Secondary | ICD-10-CM | POA: Diagnosis present

## 2023-08-20 DIAGNOSIS — J690 Pneumonitis due to inhalation of food and vomit: Secondary | ICD-10-CM | POA: Diagnosis present

## 2023-08-20 DIAGNOSIS — R339 Retention of urine, unspecified: Secondary | ICD-10-CM | POA: Diagnosis present

## 2023-08-20 DIAGNOSIS — R6510 Systemic inflammatory response syndrome (SIRS) of non-infectious origin without acute organ dysfunction: Secondary | ICD-10-CM | POA: Diagnosis present

## 2023-08-20 DIAGNOSIS — Z7189 Other specified counseling: Secondary | ICD-10-CM

## 2023-08-20 DIAGNOSIS — Z6833 Body mass index (BMI) 33.0-33.9, adult: Secondary | ICD-10-CM

## 2023-08-20 DIAGNOSIS — R1310 Dysphagia, unspecified: Secondary | ICD-10-CM | POA: Diagnosis present

## 2023-08-20 DIAGNOSIS — D649 Anemia, unspecified: Secondary | ICD-10-CM | POA: Diagnosis present

## 2023-08-20 DIAGNOSIS — R5381 Other malaise: Secondary | ICD-10-CM | POA: Diagnosis present

## 2023-08-20 DIAGNOSIS — U071 COVID-19: Principal | ICD-10-CM | POA: Diagnosis present

## 2023-08-20 DIAGNOSIS — E441 Mild protein-calorie malnutrition: Secondary | ICD-10-CM | POA: Diagnosis present

## 2023-08-20 DIAGNOSIS — G319 Degenerative disease of nervous system, unspecified: Secondary | ICD-10-CM | POA: Diagnosis present

## 2023-08-20 LAB — CBC WITH DIFFERENTIAL/PLATELET
Abs Immature Granulocytes: 0.05 K/uL (ref 0.00–0.07)
Basophils Absolute: 0 K/uL (ref 0.0–0.1)
Basophils Relative: 0 %
Eosinophils Absolute: 0 K/uL (ref 0.0–0.5)
Eosinophils Relative: 0 %
HCT: 39.4 % (ref 39.0–52.0)
Hemoglobin: 12.2 g/dL — ABNORMAL LOW (ref 13.0–17.0)
Immature Granulocytes: 1 %
Lymphocytes Relative: 15 %
Lymphs Abs: 1.2 K/uL (ref 0.7–4.0)
MCH: 26.9 pg (ref 26.0–34.0)
MCHC: 31 g/dL (ref 30.0–36.0)
MCV: 86.8 fL (ref 80.0–100.0)
Monocytes Absolute: 1.1 K/uL — ABNORMAL HIGH (ref 0.1–1.0)
Monocytes Relative: 14 %
Neutro Abs: 5.6 K/uL (ref 1.7–7.7)
Neutrophils Relative %: 70 %
Platelets: 188 K/uL (ref 150–400)
RBC: 4.54 MIL/uL (ref 4.22–5.81)
RDW: 14.1 % (ref 11.5–15.5)
WBC: 8 K/uL (ref 4.0–10.5)
nRBC: 0 % (ref 0.0–0.2)

## 2023-08-20 LAB — URINALYSIS, ROUTINE W REFLEX MICROSCOPIC
Bilirubin Urine: NEGATIVE
Glucose, UA: NEGATIVE mg/dL
Ketones, ur: NEGATIVE mg/dL
Nitrite: NEGATIVE
Protein, ur: 100 mg/dL — AB
RBC / HPF: >50 RBC/hpf (ref 0–5)
Specific Gravity, Urine: 1.021 (ref 1.005–1.030)
WBC, UA: >50 WBC/hpf (ref 0–5)
pH: 5 (ref 5.0–8.0)

## 2023-08-20 LAB — COMPREHENSIVE METABOLIC PANEL WITH GFR
ALT: 34 U/L (ref 0–44)
AST: 36 U/L (ref 15–41)
Albumin: 3.3 g/dL — ABNORMAL LOW (ref 3.5–5.0)
Alkaline Phosphatase: 48 U/L (ref 38–126)
Anion gap: 12 (ref 5–15)
BUN: 13 mg/dL (ref 8–23)
CO2: 25 mmol/L (ref 22–32)
Calcium: 8.9 mg/dL (ref 8.9–10.3)
Chloride: 103 mmol/L (ref 98–111)
Creatinine, Ser: 0.92 mg/dL (ref 0.61–1.24)
GFR, Estimated: >60 mL/min (ref >60)
Glucose, Bld: 129 mg/dL — ABNORMAL HIGH (ref 70–99)
Potassium: 3.9 mmol/L (ref 3.5–5.1)
Sodium: 140 mmol/L (ref 135–145)
Total Bilirubin: 0.6 mg/dL (ref 0.0–1.2)
Total Protein: 7.2 g/dL (ref 6.5–8.1)

## 2023-08-20 LAB — RESP PANEL BY RT-PCR (RSV, FLU A&B, COVID)  RVPGX2
Influenza A by PCR: NEGATIVE
Influenza B by PCR: NEGATIVE
Resp Syncytial Virus by PCR: NEGATIVE
SARS Coronavirus 2 by RT PCR: POSITIVE — AB

## 2023-08-20 LAB — CK: Total CK: 142 U/L (ref 49–397)

## 2023-08-20 MED ORDER — ONDANSETRON HCL 4 MG PO TABS
4.0000 mg | ORAL_TABLET | Freq: Four times a day (QID) | ORAL | Status: DC | PRN
Start: 2023-08-20 — End: 2023-08-27

## 2023-08-20 MED ORDER — POLYETHYLENE GLYCOL 3350 17 G PO PACK: 17.0000 g | PACK | Freq: Every day | ORAL | Status: DC | PRN

## 2023-08-20 MED ORDER — ACETAMINOPHEN 325 MG PO TABS
650.0000 mg | ORAL_TABLET | Freq: Four times a day (QID) | ORAL | Status: DC | PRN
  Administered 2023-08-20 – 2023-08-22 (×9): 650 mg via ORAL
  Filled 2023-08-20 (×5): qty 2

## 2023-08-20 MED ORDER — CHLORHEXIDINE GLUCONATE CLOTH 2 % EX PADS
6.0000 | MEDICATED_PAD | Freq: Every day | CUTANEOUS | Status: DC
  Administered 2023-08-21 – 2023-08-26 (×9): 6 via TOPICAL

## 2023-08-20 MED ORDER — ACETAMINOPHEN 650 MG RE SUPP: 650.0000 mg | Freq: Four times a day (QID) | RECTAL | Status: DC | PRN

## 2023-08-20 MED ORDER — ENOXAPARIN SODIUM 40 MG/0.4ML IJ SOSY
40.0000 mg | PREFILLED_SYRINGE | INTRAMUSCULAR | Status: DC
  Administered 2023-08-20 – 2023-08-21 (×3): 40 mg via SUBCUTANEOUS
  Filled 2023-08-20 (×2): qty 0.4

## 2023-08-20 MED ORDER — GUAIFENESIN ER 600 MG PO TB12
600.0000 mg | ORAL_TABLET | Freq: Two times a day (BID) | ORAL | Status: DC
  Administered 2023-08-20 – 2023-08-26 (×19): 600 mg via ORAL
  Filled 2023-08-20 (×13): qty 1

## 2023-08-20 MED ORDER — ONDANSETRON HCL 4 MG/2ML IJ SOLN: 4.0000 mg | Freq: Four times a day (QID) | INTRAMUSCULAR | Status: DC | PRN

## 2023-08-20 NOTE — ED Provider Notes (Signed)
 [place] EMERGENCY DEPARTMENT AT [facility] Provider Note   CSN: [id number] Arrival date & time: 08/20/23  9167     Patient presents with: Weakness   Edgar Mckay Sr. is a 88 y.o. 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  Patient was recently just admitted for this and discharged on 8 - 8 - 25.  He was initially offered SNF however declined at this time.  Patient reports that his son was trying to help him get out of the chair/bed to go to the bathroom however the patient was unable to lift himself out of the chair and the son was unable to assist with this.  Given that he is unable to be cared for and needs a higher level of care, he did return to the emergency department.  He denies any dysuria, abdominal pain, chest pain, shortness breath, cough or cold symptoms.  Reports that he has been having some generalized bilateral leg weakness for the past 2 to 3 years.  He denies any blood present in his Foley or catheter.  He denies any numbness or tingling to the legs.  Reports that he has been having some problems with swelling for the past 2 to 3 years.  I was able to finally speak to the son later, the patient did not have a fall, he was unable to be lifted out of the chair so EMS was called.   Weakness Associated symptoms: no abdominal pain, no cough, no fever and no shortness of breath        Prior to Admission medications   Medication Sig Start Date End Date Taking? Authorizing Provider  acetaminophen  (TYLENOL ) 500 MG tablet Take 2 tablets (1,000 mg total) by mouth every 6 (six) hours as needed for mild pain (pain score 1-3). 08/18/23   Will Almarie MATSU, MD  Chlorpheniramine Maleate (ALLERGY RELIEF PO) Take 1 tablet by mouth daily.     [provider]  cyanocobalamin  1000 MCG tablet Take 1 tablet (1,000 mcg total) by mouth daily for Vitamin Supplement. Patient taking differently: Take 1,000 mcg by mouth daily. Gummies 03/10/21   Dietrich Males, AGNP-C  guaiFENesin  (MUCUS RELIEF PO) Take 2 tablets by mouth in the morning.    [provider]  ibuprofen  (ADVIL ) 200 MG tablet Take 1 tablet (200 mg total) by mouth every 6 (six) hours as needed for fever, headache, mild pain (pain score 1-3) or cramping. Patient taking differently: Take 400 mg by mouth daily as needed for fever, headache, mild pain (pain score 1-3), cramping or moderate pain (pain score 4-6). 07/27/23   Ghimire, Kuber, MD  Multiple Vitamin (MULTIVITAMIN ADULT PO) Take 1 each by mouth in the morning. Gummies    [provider]  polyethylene glycol (MIRALAX  / GLYCOLAX ) 17 g packet Take 17 g by mouth daily as needed for mild constipation. 08/18/23   Will Almarie MATSU, MD    Allergies: Citrus and Shellfish allergy    Review of Systems  Constitutional:  Negative for chills and fever.  HENT:  Negative for congestion and rhinorrhea.   Respiratory:  Negative for cough and shortness of breath.   Cardiovascular:  Positive for leg swelling (Chronic).  Gastrointestinal:  Negative for abdominal pain.  Genitourinary:  Negative for hematuria.  Neurological:  Positive for weakness.  Updated Vital Signs BP 110/67   Pulse 66   Temp 98.9 F (37.2 C) (Oral)   Resp 18   SpO2 100%   Physical Exam Vitals and nursing note reviewed.  Constitutional:      General: He is not in acute distress.    Appearance: He is not toxic-appearing.     Comments: Sitting up in stretcher watching television in no acute distress  Eyes:     General: No scleral icterus. Cardiovascular:     Rate and Rhythm: Normal rate.  Pulmonary:     Effort: Pulmonary effort is normal. No respiratory distress.     Comments: Slight rhonchi auscultated in the right lower base  but clear lung sounds auscultated on the left.  He satting on room air without increased work of breathing. Genitourinary:    Comments: Catheter draining yellow urine Musculoskeletal:     Right lower leg: No edema.     Left lower leg: No edema.  Skin:    General: Skin is warm and dry.  Neurological:     Mental Status: He is alert.     Motor: Weakness present.     Comments: Generalized lower leg weakness however no real focal deficit appreciated.  Sensation reportedly intact and symmetric in lower extremities.  Equal grip strength.     (all labs ordered are listed, but only abnormal results are displayed) Labs Reviewed  RESP PANEL BY RT-PCR (RSV, FLU A&B, COVID)  RVPGX2 - Abnormal; Notable for the following components:      Result Value   SARS Coronavirus 2 by RT PCR POSITIVE (*)    All other components within normal limits  CBC WITH DIFFERENTIAL/PLATELET - Abnormal; Notable for the following components:   Hemoglobin 12.2 (*)    Monocytes Absolute 1.1 (*)    All other components within normal limits  COMPREHENSIVE METABOLIC PANEL WITH GFR - Abnormal; Notable for the following components:   Glucose, Bld 129 (*)    Albumin 3.3 (*)    All other components within normal limits  URINALYSIS, ROUTINE W REFLEX MICROSCOPIC - Abnormal; Notable for the following components:   APPearance CLOUDY (*)    Hgb urine dipstick MODERATE (*)    Protein, ur 100 (*)    Leukocytes,Ua LARGE (*)    Bacteria, UA RARE (*)    All other components within normal limits  CK    EKG: EKG Interpretation Date/Time:  Sunday August 20 2023 08:47:06 EDT Ventricular Rate:  72 PR Interval:  249 QRS Duration:  114 QT Interval:  388 QTC Calculation: 425 R Axis:   -26  Text Interpretation: Sinus rhythm Prolonged PR interval Borderline intraventricular conduction delay Borderline T wave abnormalities No significant change since last tracing Confirmed by Doretha Folks ([id number]) on 08/20/2023 9:35:15  AM  Radiology: [facility] Chest Portable 1 View Result Date: 08/20/2023 EXAM: 1 VIEW XRAY OF THE CHEST 08/20/2023 10:52:00 AM COMPARISON: 1 view chest x-ray 11/30/2020 and CT angio of chest 07/21/2023. CLINICAL HISTORY: Patient with recent admission for weakness, now presenting with increased weakness and rhonchi on the right. EMS called for assistance after patient became too weak and slid to the floor. Son reports helping patient to the bathroom prior to the incident. FINDINGS: LUNGS AND PLEURA: No focal pulmonary opacity. No pulmonary edema. No pleural effusion. No pneumothorax. HEART AND MEDIASTINUM: Heart size is upper limits of normal. No acute abnormality of the cardiac and mediastinal silhouettes. BONES AND SOFT TISSUES: No acute osseous abnormality. IMPRESSION: 1. No acute findings.  Electronically signed by: Lonni Necessary MD 08/20/2023 11:12 AM EDT RP Workstation: [id number]   Procedures   Medications Ordered in the ED - No data to display  Clinical Course as of 08/20/23 1647  Sun Aug 20, 2023  1132 Attempted to call son. Home phone number not in service, mobile number was in service and HIPAA compliant voicemail left.  [RR]  1243 Spoke with Lander Raddle. he reports the patient did not fall however he was unable to let the patient go to the bathroom does not feel that he is able to continue caring for him due to his weakness.  Discussed urinalysis and COVID results.  Will try for admission given the patient's generalized weakness, COVID, potential UTI however could be contaminated the patient's chronic catheter use. [RR]    Clinical Course User Index [RR] Bernis Ernst, PA-C   Medical Decision Making Amount and/or Complexity of Data Reviewed Labs: ordered. Radiology: ordered.  Risk Decision regarding hospitalization.  88 y.o. male presents to the ER for evaluation of generalized upper leg weakness. Differential diagnosis includes but is not limited to deconditioning, arthritis, UTI,  electrolyte abnormality, stroke. Vital signs unremarkable. Physical exam as noted above.   On previous chart evaluation, patient was recently discharged on 8 - 8 - 25 for treatment of UTI for generalized weakness.  Was recommended to go to SNF however patient declined.  Patient is now amenable for SNF admission given he is not he like he can be taken care of at home.  Will order lab and imaging and urine.  Do hear some rhonchi present on the right lower base but denies any chest pain, shortness breath, cough or cold symptoms.  Will obtain COVID as well.  He denies any change in condition of his health since being discharged from the hospital 2 days previously.  I independently reviewed and interpreted the patient's labs.  CBC shows hemoglobin at baseline.  No [place] ptosis.  CMP showed glucose of 129 and an albumin of 3.3.  Otherwise no other electrolyte or LFT abnormality.  CK within normal meds.  Urinalysis shows cloudy urine with moderate hemoglobin present.  100 protein with large amount leukocytes with greater than 50 red blood cells and white blood cells present with rare bacteria.  There is some budding yeast present but no white blood cell clumps.  Maybe chronic appearance due to patient's chronic indwelling urinary catheter.  Patient is COVID-positive.  Chest x-ray shows 1. No acute findings. Per radiologist's interpretation.    Given patient's generalized weakness, do not feel he stable for discharge home.  Will need PT OT evaluation and likely discharge to SNF.  I discussed this case with patient's son as well who agrees with admission.  Spoke case with Dr. Celinda who agrees with admission.  I discussed this case with my attending physician who cosigned this note including patient's presenting symptoms, physical exam, and planned diagnostics and interventions. Attending physician stated agreement with plan or made changes to plan which were implemented.   Attending physician assessed patient at  bedside.  Portions of this report may have been transcribed using voice recognition software. Every effort was made to ensure accuracy; however, inadvertent computerized transcription errors may be present.    Final diagnoses:  None    ED Discharge Orders     None          Bernis Ernst, [place] 08/20/23 1657    Doretha Folks, MD 08/23/23 [phone]

## 2023-08-20 NOTE — Plan of Care (Signed)
  Problem: Respiratory: Goal: Will maintain a patent airway Outcome: Progressing Goal: Complications related to the disease process, condition or treatment will be avoided or minimized Outcome: Progressing   Problem: Nutrition: Goal: Adequate nutrition will be maintained Outcome: Progressing   Problem: Pain Managment: Goal: General experience of comfort will improve and/or be controlled Outcome: Progressing   Problem: Safety: Goal: Ability to remain free from injury will improve Outcome: Progressing

## 2023-08-20 NOTE — ED Triage Notes (Signed)
 [facility] reports pt coming from home for increased weakness today. Son was helping pt to the bathroom and he got to weak and slid to the floor. Pt has been recently admitted for weakness. Pt just c/o increased weakness for the past few days and hurting all over.

## 2023-08-20 NOTE — H&P (Signed)
 History and Physical    Patient: Edgar Mckay FMW:[id number] DOB: 1935-10-04 DOA: 08/20/2023 DOS: the patient was seen and examined on 08/20/2023 PCP: [facility]  Patient coming from: Home  Chief Complaint:  Chief Complaint  Patient presents with   Weakness   HPI: Edgar Mckay Sr. is a 88 y.o. male with medical history significant of anxiety, recurrent UTI, TIA, urinary retention who has been admitted twice in the past 30 days due to generalized weakness and complicated UTI who returned to the emergency department via EMS due to increased generalized weakness today. His son was helping him to go to the bathroom at home, the patient became very weak and slid to the floor. On the most recent admission the patient was offered to go to a skilled nursing facility for rehab, but he declined this option. He endorses myalgias, mild sore throat and rhinorrhea, but he denied fever, chills, wheezing or hemoptysis. No chest pain, palpitations, diaphoresis, PND, orthopnea or pitting edema of the lower extremities. No abdominal pain, nausea, emesis, diarrhea, melena or hematochezia but gets occasional constipation. No flank pain or hematuria. No polyuria, polydipsia, polyphagia or blurred vision.   Lab work: Urinalysis with moderate hemoglobin, large leukocyte esterase, protein of 100 mg/dL, greater than 50 WBC, greater than 50 RBC and rare bacteria.  CBCs are white count 8.0, hemoglobin 12.2 g/dL platelets 811.  Total CK was 142 units/L.  CMP showed a glucose of 129 mg/dL and albumin of 3.3 g/dL, the rest of the CMP measurements were normal.  Coronavirus, influenza and RSV PCR test was positive for coronavirus.  Imaging: Portable 1 view chest radiograph with no acute findings.   ED course: Initial vital signs were temperature 98.9 F, pulse 69, respiration 18, BP 116/61 mmHg O2 sat 97% on room air.  Review of Systems: As mentioned in the history of present illness. All other  systems reviewed and are negative. Past Medical History:  Diagnosis Date   Urinary retention    Past Surgical History:  Procedure Laterality Date   PROSTATE BIOPSY  1990   Social History:  reports that he has been smoking cigarettes. He has a 60 pack-year smoking history. He has never used smokeless tobacco. He reports that he does not currently use alcohol. He reports that he does not use drugs.  Allergies  Allergen Reactions   Citrus Anaphylaxis    Swelling of mouth, cannot swallow.    Shellfish Allergy Swelling    Swelling of tongue     Family History  Family history unknown: Yes    Prior to Admission medications   Medication Sig Start Date End Date Taking? Authorizing Provider  acetaminophen  (TYLENOL ) 500 MG tablet Take 2 tablets (1,000 mg total) by mouth every 6 (six) hours as needed for mild pain (pain score 1-3). 08/18/23   Will Almarie MATSU, MD  Chlorpheniramine Maleate (ALLERGY RELIEF PO) Take 1 tablet by mouth daily.    [provider]  cyanocobalamin  1000 MCG tablet Take 1 tablet (1,000 mcg total) by mouth daily for Vitamin Supplement. Patient taking differently: Take 1,000 mcg by mouth daily. Gummies 03/10/21   Dietrich Males, AGNP-C  guaiFENesin  (MUCUS RELIEF PO) Take 2 tablets by mouth in the morning.    [provider]  ibuprofen  (ADVIL ) 200 MG tablet Take 1 tablet (200 mg total) by mouth every 6 (six) hours as needed for fever, headache, mild pain (pain score 1-3) or cramping. Patient taking differently: Take 400 mg by mouth daily  as needed for fever, headache, mild pain (pain score 1-3), cramping or moderate pain (pain score 4-6). 07/27/23   Ghimire, Kuber, MD  Multiple Vitamin (MULTIVITAMIN ADULT PO) Take 1 each by mouth in the morning. Gummies    [provider]  polyethylene glycol (MIRALAX  / GLYCOLAX ) 17 g packet Take 17 g by mouth daily as needed for mild constipation. 08/18/23   Will Almarie MATSU, MD    Physical Exam: Vitals:    08/20/23 0842 08/20/23 1045 08/20/23 1245  BP: 116/61 110/67 (!) 101/58  Pulse: 69 66   Resp: 18 18 17   Temp: 98.9 F (37.2 C)  98.3 F (36.8 C)  TempSrc: Oral  Oral  SpO2: 97% 100% 98%   Physical Exam Vitals reviewed.  Constitutional:      General: He is awake. He is not in acute distress.    Appearance: He is obese. He is ill-appearing.  HENT:     Head: Normocephalic.     Nose: No rhinorrhea.     Mouth/Throat:     Mouth: Mucous membranes are moist.  Eyes:     General: No scleral icterus.    Pupils: Pupils are equal, round, and reactive to light.  Cardiovascular:     Rate and Rhythm: Normal rate and regular rhythm.  Pulmonary:     Effort: Pulmonary effort is normal.     Breath sounds: Rhonchi present. No wheezing or rales.  Abdominal:     General: Bowel sounds are normal. There is no distension.     Palpations: Abdomen is soft.     Tenderness: There is no abdominal tenderness.  Musculoskeletal:     Cervical back: Neck supple.     Right lower leg: No edema.     Left lower leg: No edema.  Skin:    General: Skin is warm and dry.  Neurological:     General: No focal deficit present.     Mental Status: He is alert and oriented to person, place, and time.  Psychiatric:        Mood and Affect: Mood normal.        Behavior: Behavior normal. Behavior is cooperative.     Data Reviewed:  Results are pending, will review when available.  12/07/2020 echocardiogram report. IMPRESSIONS:   1. Left ventricular ejection fraction, by estimation, is 55 to 60%. The left ventricle has normal function. The left ventricle has no regional wall motion abnormalities. There is mild left ventricular hypertrophy. Left ventricular diastolic parameters were normal.  2. Right ventricular systolic function is normal. The right ventricular size is normal. There is normal pulmonary artery systolic pressure.  3. Trivial mitral valve regurgitation.  4. Aortic valve regurgitation is not  visualized. Aortic valve sclerosis/calcification is present, without any evidence of aortic stenosis.  5. The inferior vena cava is normal in size with greater than 50% respiratory variability, suggesting right atrial pressure of 3 mmHg.  EKG: Vent. rate 72 BPM PR interval 249 ms QRS duration 114 ms QT/QTcB 388/425 ms P-R-T axes 31 -26 30 Sinus rhythm Prolonged PR interval Borderline intraventricular conduction delay Borderline T wave abnormalities.  Assessment and Plan: Principal Problem:   Failure to thrive in adult Associated with worsening of:   Generalized weakness In the setting of:   COVID-19 virus infection Observation/MedSurg. Symptomatic treatment. Continue guaifenesin  and acetaminophen . Consult physical and occupational Therapy. Consult transition of care team. Monitor respiratory status. Remdesivir to be started if hypoxic.  Active Problems:   Urinary retention Has chronic indwelling  catheter. With history of recurrent UTIs. No fever or flank pain.    Class 1 obesity Current BMI 33.97 kg/m. Would benefit from decrease caloric intake. Follow-up closely with PCP.    Mild protein malnutrition (HCC) In the setting of chronic illness/normocytic anemia. Consider nutritional services evaluation.    Normocytic anemia Monitor hematocrit/hemoglobin.    Advance Care Planning:   Code Status: Full Code   Consults:   Family Communication:   Severity of Illness: The appropriate patient status for this patient is INPATIENT. Inpatient status is judged to be reasonable and necessary in order to provide the required intensity of service to ensure the patient's safety. The patient's presenting symptoms, physical exam findings, and initial radiographic and laboratory data in the context of their chronic comorbidities is felt to place them at high risk for further clinical deterioration. Furthermore, it is not anticipated that the patient will be medically stable for  discharge from the hospital within 2 midnights of admission.   * I certify that at the point of admission it is my clinical judgment that the patient will require inpatient hospital care spanning beyond 2 midnights from the point of admission due to high intensity of service, high risk for further deterioration and high frequency of surveillance required.*  Author: Alm Dorn Castor, MD 08/20/2023 1:04 PM  For on call review [email or web address].   This document was prepared using Dragon voice recognition software and may contain some unintended transcription errors.

## 2023-08-21 DIAGNOSIS — R627 Adult failure to thrive: Secondary | ICD-10-CM | POA: Diagnosis not present

## 2023-08-21 LAB — COMPREHENSIVE METABOLIC PANEL WITH GFR
ALT: 34 U/L (ref 0–44)
AST: 38 U/L (ref 15–41)
Albumin: 3.1 g/dL — ABNORMAL LOW (ref 3.5–5.0)
Alkaline Phosphatase: 47 U/L (ref 38–126)
Anion gap: 11 (ref 5–15)
BUN: 17 mg/dL (ref 8–23)
CO2: 23 mmol/L (ref 22–32)
Calcium: 8.7 mg/dL — ABNORMAL LOW (ref 8.9–10.3)
Chloride: 107 mmol/L (ref 98–111)
Creatinine, Ser: 1.04 mg/dL (ref 0.61–1.24)
GFR, Estimated: >60 mL/min (ref >60)
Glucose, Bld: 116 mg/dL — ABNORMAL HIGH (ref 70–99)
Potassium: 4.3 mmol/L (ref 3.5–5.1)
Sodium: 141 mmol/L (ref 135–145)
Total Bilirubin: 0.5 mg/dL (ref 0.0–1.2)
Total Protein: 6.6 g/dL (ref 6.5–8.1)

## 2023-08-21 LAB — CBC
HCT: 39.7 % (ref 39.0–52.0)
Hemoglobin: 12.2 g/dL — ABNORMAL LOW (ref 13.0–17.0)
MCH: 27.2 pg (ref 26.0–34.0)
MCHC: 30.7 g/dL (ref 30.0–36.0)
MCV: 88.4 fL (ref 80.0–100.0)
Platelets: 152 K/uL (ref 150–400)
RBC: 4.49 MIL/uL (ref 4.22–5.81)
RDW: 14.3 % (ref 11.5–15.5)
WBC: 7.1 K/uL (ref 4.0–10.5)
nRBC: 0 % (ref 0.0–0.2)

## 2023-08-21 MED ORDER — BENZONATATE 100 MG PO CAPS
100.0000 mg | ORAL_CAPSULE | Freq: Three times a day (TID) | ORAL | Status: DC
  Administered 2023-08-21 – 2023-08-26 (×24): 100 mg via ORAL
  Filled 2023-08-21 (×18): qty 1

## 2023-08-21 MED ORDER — IBUPROFEN 200 MG PO TABS
200.0000 mg | ORAL_TABLET | Freq: Four times a day (QID) | ORAL | Status: AC | PRN
  Administered 2023-08-21 – 2023-08-22 (×6): 200 mg via ORAL
  Filled 2023-08-21 (×3): qty 1

## 2023-08-21 MED ORDER — SODIUM CHLORIDE 0.9 % IV SOLN
1.0000 g | INTRAVENOUS | Status: DC
  Administered 2023-08-21 – 2023-08-23 (×6): 1 g via INTRAVENOUS
  Filled 2023-08-21 (×3): qty 10

## 2023-08-21 NOTE — TOC Initial Note (Addendum)
 Transition of Care (TOC) - Initial/Assessment Note    Patient Details  Name: Edgar MEXICANO Sr. MRN: [id number] Date of Birth: 10-27-1935  Transition of Care [facility]) CM/SW Contact:    Doneta Glenys DASEN, RN Phone Number: 08/21/2023, 11:01 AM  Clinical Narrative:                 Presented for generalized weakness. Patient states PTA he recently was discharged from [facility]  [facility] and was not able to walk. Before that lives in a house with son and grandson. Verified PCP/insurance;DME- WC, rollator, BSC, [place]; Denies HH, oxygen and SDOH needs. HH with [facility]. Will need a ROC orders for [facility] PT/OT/Nurse. Patient states his son will pick him up if he can walk. CM will follow for ambulation needs. CM spoke with patients son. Son states that his father will not always express his needs. 3:00 PM Patient agreeable to starting starting SNF work-up. FL2, and referrals sent via HUB. Waiting on bed offers.  Expected Discharge Plan: Home w Home Health Services Barriers to Discharge: Continued Medical Work up   Patient Goals and CMS Choice Patient states their goals for this hospitalization and ongoing recovery are:: Wherever CMS Medicare.gov Compare Post Acute Care list provided to::  (NA) Choice offered to / list presented to : NA [place] ownership interest in [facility].provided to:: Parent NA    Expected Discharge Plan and Services In-house Referral: NA Discharge Planning Services: CM Consult Post Acute Care Choice: NA Living arrangements for the past 2 months: Single Family Home                 DME Arranged: N/A DME Agency: NA       HH Arranged: NA HH Agency: NA        Prior Living Arrangements/Services Living arrangements for the past 2 months: Single Family Home Lives with:: Adult Children Patient language and need for interpreter reviewed:: Yes Do you feel safe going back to the place where you live?: Yes      Need for Family Participation in  Patient Care: Yes (Comment) Care giver support system in place?: Yes (comment) Current home services: DME, Home OT, Home PT (cane, walker,BSC, , [facility]) Criminal Activity/Legal Involvement Pertinent to Current Situation/Hospitalization: No - Comment as needed  Activities of Daily Living   ADL Screening (condition at time of admission) Independently performs ADLs?: No Does the patient have a NEW difficulty with bathing/dressing/toileting/self-feeding that is expected to last >3 days?: No Does the patient have a NEW difficulty with getting in/out of bed, walking, or climbing stairs that is expected to last >3 days?: No Does the patient have a NEW difficulty with communication that is expected to last >3 days?: No Is the patient deaf or have difficulty hearing?: Yes Does the patient have difficulty seeing, even when wearing glasses/contacts?: Yes Does the patient have difficulty concentrating, remembering, or making decisions?: Yes  Permission Sought/Granted Permission sought to share information with : Case Manager Permission granted to share information with : Yes, Verbal Permission Granted  Share Information with NAME: Edgar Mckay son [phone]  Permission granted to share info w AGENCY: [facility]        Emotional Assessment Appearance:: Appears stated age Attitude/Demeanor/Rapport: Engaged Affect (typically observed): Irritable Orientation: : Oriented to Self, Oriented to Place, Oriented to  Time, Oriented to Situation Alcohol / Substance Use: Not Applicable Psych Involvement: No (comment)  Admission diagnosis:  Failure to thrive in adult [R62.7] Patient Active  Problem List   Diagnosis Date Noted   Failure to thrive in adult 08/20/2023   COVID-19 virus infection 08/20/2023   Class 1 obesity 08/20/2023   Mild protein malnutrition (HCC) 08/20/2023   Normocytic anemia 08/20/2023   Generalized weakness 08/15/2023   Recurrent UTI 07/22/2023   Fall at home, initial encounter  07/21/2023   TIA (transient ischemic attack) 12/07/2020   Urinary retention 12/05/2020   Muscle weakness (generalized) 12/05/2020   Unsteadiness on feet 12/05/2020   Frailty 12/05/2020   Acute cystitis with hematuria    Poor social situation    Obstruction of Foley catheter (HCC) 11/30/2020   PCP:  [facility] Pharmacy:   [facility] [address] [place] [address] Phone: [phone] Fax: [phone]  [id number] [place], [place] - [address] AT [facility] [address] [facility] [address] [place] [address] Phone: [phone] Fax: [phone]  [facility] [address] [place] [address] Phone: [phone] Fax: [phone]     Social Drivers of Health (SDOH) Social History: SDOH Screenings   Food Insecurity: No Food Insecurity (08/20/2023)  Housing: Low Risk  (08/20/2023)  Transportation Needs: No Transportation Needs (08/20/2023)  Utilities: Not At Risk (08/20/2023)  Depression (PHQ2-9): Low Risk  (03/25/2021)  Social Connections: Unknown (08/20/2023)  Recent Concern: Social Connections - Socially Isolated (08/16/2023)  Tobacco Use: High Risk (08/20/2023)   SDOH Interventions:     Readmission Risk Interventions     No data to display

## 2023-08-21 NOTE — Plan of Care (Signed)
  Problem: Respiratory: Goal: Will maintain a patent airway Outcome: Progressing   Problem: Clinical Measurements: Goal: Respiratory complications will improve Outcome: Not Progressing

## 2023-08-21 NOTE — Progress Notes (Addendum)
 Transfer order received for higher level of care on [address] [place]; notified patient of plan and called patient's son, Marchello Rothgeb., to notify him of plan of care; son verbalized understanding; will continue to monitor

## 2023-08-21 NOTE — Progress Notes (Signed)
 PROGRESS NOTE  Edgar MOCH Sr.  DOB: Jun 08, 1935  PCP: [facility] FMW:[id number]  DOA: 08/20/2023  LOS: 1 day  Hospital Day: 2  Brief narrative: Edgar [name] is a 88 y.o. male with PMH significant for chronic urinary retention s/p chronic Foley, recurrent UTI, debility, anxiety, who has been hospitalized twice in the past 30 days due to generalized weakness and complicated UTI  8/10, patient presented to ED with complaint of generalized weakness. His son was helping him to go to the bathroom at home, the patient became very weak and slid to the floor.  Patient also reports myalgia, mild sore throat, rhinorrhea.  No fever. On the most recent admission the patient was offered to go to a skilled nursing facility for rehab, but he declined it and went home.  In the ED, patient was afebrile, hemodynamically stable Initial labs with CBC unremarkable, CMP unremarkable Respiratory virus panel with COVID PCR positive Urinalysis showed cloudy yellow urine with moderate hemoglobin, large leukocytes, rare bacteria Chest x-ray unremarkable  Subjective: Patient was seen and examined this morning.  Pleasant elderly African-American male.  Propped up in bed.  Not in distress.  Feels weak.  Was seen by OT before I saw him.  Patient needed a lot of assistance and hence rehab has been recommended. Chart reviewed Tmax 99.3, blood pressure stable.  Assessment and plan: COVID-19 virus infection Presented with generalized weakness COVID PCR positive.  Not hypoxic.  Chest x-ray without infiltrates Not started on antiviral or steroids Complains of cough.  On Mucinex  twice daily.  Will add Tessalon  Perles as well Recent Labs  Lab 08/15/23 1814 08/20/23 0915 08/20/23 1031 08/21/23 0420  [id number]  --   --  POSITIVE*  --   WBC 8.0 8.0  --  7.1  ALT 25 34  --  34    Chronic urinary retention S/p chronic indwelling catheter. With history of recurrent UTIs. No fever or  flank pain.  Mild protein calorie malnutrition Dietitian consult  Class I obesity  Body mass index is 33.97 kg/m. Patient has been advised to make an attempt to improve diet and exercise patterns to aid in weight loss.   Mobility:  PT Orders: Active   PT Follow up Rec:    Goals of care   Code Status: Full Code     DVT prophylaxis:  enoxaparin  (LOVENOX ) injection 40 mg Start: 08/20/23 2200   Antimicrobials: None Fluid: None.  Encourage oral hydration Consultants: None Family Communication: None at bedside  Status: Inpatient Level of care:  Med-Surg   Patient is from: Home Needs to continue in-hospital care: Remains inpatient monitoring for next 24 to 48 hours. Anticipated d/c to: Likely needs rehab    Diet:  Diet Order             Diet Heart Fluid consistency: Thin  Diet effective now                   Scheduled Meds:  benzonatate   100 mg Oral TID   Chlorhexidine  Gluconate Cloth  6 each Topical Daily   enoxaparin  (LOVENOX ) injection  40 mg Subcutaneous Q24H   guaiFENesin   600 mg Oral BID    PRN meds: acetaminophen  **OR** acetaminophen , ondansetron  **OR** ondansetron  (ZOFRAN ) IV, polyethylene glycol   Infusions:    Antimicrobials: Anti-infectives (From admission, onward)    None       Objective: Vitals:   08/20/23 2205 08/21/23 0402  BP: 123/60 133/64  Pulse: 87  78  Resp: 19 19  Temp: 99.3 F (37.4 C) 99 F (37.2 C)  SpO2: 97% 97%    Intake/Output Summary (Last 24 hours) at 08/21/2023 1132 Last data filed at 08/21/2023 0345 Gross per 24 hour  Intake 240 ml  Output 410 ml  Net -170 ml   Filed Weights   08/20/23 1455  Weight: 104.3 kg   Weight change:  Body mass index is 33.97 kg/m.   Physical Exam: General exam: Pleasant, elderly African-American male..  Feels weak Skin: No rashes, lesions or ulcers. HEENT: Atraumatic, normocephalic, no obvious bleeding Lungs: Clear to auscultation bilaterally, no crackles or  wheezing CVS: S1, S2, no murmur,   GI/Abd: Soft, nontender, nondistended, bowel sound present,   CNS: Alert, awake, oriented to place and person.  Slow to respond Psychiatry: Sad affect Extremities: No pedal edema, no calf tenderness,   Data Review: I have personally reviewed the laboratory data and studies available.  F/u labs ordered Unresulted Labs (From admission, onward)    None      Signed, Chapman Rota, MD [facility] 08/21/2023

## 2023-08-21 NOTE — [place] FL2 (Signed)
 [place]  MEDICAID FL2 LEVEL OF CARE FORM     IDENTIFICATION  Patient Name: Edgar NESMITH Sr. Birthdate: 1935-02-13 Sex: male Admission Date (Current Location): 08/20/2023  [facility] and [place] Number:  Producer, television/film/video and Address:  [facility],  [address], [place] [address]      Provider Number: [id number]  Attending Physician Name and Address:  Arlice Reichert, MD  Relative Name and Phone Number:       Current Level of Care: Hospital Recommended Level of Care: Skilled Nursing Facility Prior Approval Number:    Date Approved/Denied:   PASRR Number: [id number]  Discharge Plan: SNF    Current Diagnoses: Patient Active Problem List   Diagnosis Date Noted   Failure to thrive in adult 08/20/2023   COVID-19 virus infection 08/20/2023   Class 1 obesity 08/20/2023   Mild protein malnutrition (HCC) 08/20/2023   Normocytic anemia 08/20/2023   Generalized weakness 08/15/2023   Recurrent UTI 07/22/2023   Fall at home, initial encounter 07/21/2023   TIA (transient ischemic attack) 12/07/2020   Urinary retention 12/05/2020   Muscle weakness (generalized) 12/05/2020   Unsteadiness on feet 12/05/2020   Frailty 12/05/2020   Acute cystitis with hematuria    Poor social situation    Obstruction of Foley catheter (HCC) 11/30/2020    Orientation RESPIRATION BLADDER Height & Weight     Self, Time, Situation, Place  Normal Indwelling catheter Weight: 104.3 kg Height:  5' 9 (175.3 cm)  BEHAVIORAL SYMPTOMS/MOOD NEUROLOGICAL BOWEL NUTRITION STATUS      Continent Diet  AMBULATORY STATUS COMMUNICATION OF NEEDS Skin   Extensive Assist Verbally Normal                       Personal Care Assistance Level of Assistance  Bathing, Feeding, Dressing Bathing Assistance: Limited assistance Feeding assistance: Limited assistance Dressing Assistance: Limited assistance     Functional Limitations Info  Speech     Speech Info: Adequate    SPECIAL CARE  FACTORS FREQUENCY  PT (By licensed PT), OT (By licensed OT)     PT Frequency: 5x weekly OT Frequency: 5x weekly            Contractures Contractures Info: Not present    Additional Factors Info  Code Status, Allergies, Isolation Precautions Code Status Info: FULL Allergies Info: Citrus, Shellfish Allergy     Isolation Precautions Info: COVID postive     Current Medications (08/21/2023):  This is the current hospital active medication list Current Facility-Administered Medications  Medication Dose Route Frequency Provider Last Rate Last Admin   acetaminophen  (TYLENOL ) tablet 650 mg  650 mg Oral Q6H PRN Celinda Alm Lot, MD   650 mg at 08/21/23 1227   Or   acetaminophen  (TYLENOL ) suppository 650 mg  650 mg Rectal Q6H PRN Celinda Alm Lot, MD       benzonatate  (TESSALON ) capsule 100 mg  100 mg Oral TID Dahal, Binaya, MD   100 mg at 08/21/23 1454   cefTRIAXone  (ROCEPHIN ) 1 g in sodium chloride  0.9 % 100 mL IVPB  1 g Intravenous Q24H Arlice Reichert, MD 200 mL/hr at 08/21/23 1515 Infusion Verify at 08/21/23 1515   Chlorhexidine  Gluconate Cloth 2 % PADS 6 each  6 each Topical Daily Celinda Alm Lot, MD   6 each at 08/21/23 0948   enoxaparin  (LOVENOX ) injection 40 mg  40 mg Subcutaneous Q24H Celinda Alm Lot, MD   40 mg at 08/20/23 2134   guaiFENesin  (MUCINEX )  12 hr tablet 600 mg  600 mg Oral BID Celinda Alm Lot, MD   600 mg at 08/21/23 0900   ibuprofen  (ADVIL ) tablet 200 mg  200 mg Oral Q6H PRN Arlice Reichert, MD   200 mg at 08/21/23 1454   ondansetron  (ZOFRAN ) tablet 4 mg  4 mg Oral Q6H PRN Celinda Alm Lot, MD       Or   ondansetron  (ZOFRAN ) injection 4 mg  4 mg Intravenous Q6H PRN Celinda Alm Lot, MD       polyethylene glycol (MIRALAX  / GLYCOLAX ) packet 17 g  17 g Oral Daily PRN Celinda Alm Lot, MD         Discharge Medications: Please see discharge summary for a list of discharge medications.  Relevant Imaging Results:  Relevant Lab  Results:   Additional Information SSN  [id number]  Doneta Glenys DASEN, RN

## 2023-08-21 NOTE — Evaluation (Signed)
 Physical Therapy Evaluation Patient Details Name: Edgar ANDRUS Sr. MRN: [id number] DOB: 11/14/1935 Today's Date: 08/21/2023  History of Present Illness  Edgar CLINCH Sr. is a 88 y.o. male home with son last week from hospital stay now readmitted with FTT/Covid 19, generalized weakness PMH: chronic Foley catheter for urinary retention, TIA, anxiety, debility, recent admission from 7/11-17 with complicated UTI/catheter associated UTI discharged to SNF, readmitted and discharged to home  Clinical Impression  Pt admitted with above diagnosis.  Pt currently with functional limitations due to the deficits listed below (see PT Problem List). Pt will benefit from acute skilled PT to increase their independence and safety with mobility to allow discharge.     Patient expresses frustration, now having Covid. Indicates RLE pain, noted edema.  Patient did participate in mobility, Stood  x 2 at Rw, unable to take patient has significant  decline in just 2 days since he was DC'd to home, was ambulating with contact guard.  Patient will benefit from continued inpatient follow up therapy, <3 hours/day       If plan is discharge home, recommend the following: Two people to help with walking and/or transfers;A lot of help with bathing/dressing/bathroom;Assistance with cooking/housework;Help with stairs or ramp for entrance   Can travel by private vehicle   No    Equipment Recommendations None recommended by PT  Recommendations for Other Services       Functional Status Assessment Patient has had a recent decline in their functional status and demonstrates the ability to make significant improvements in function in a reasonable and predictable amount of time.     Precautions / Restrictions Precautions Precautions: Fall Recall of Precautions/Restrictions: Intact Precaution/Restrictions Comments: Air/Contact Restrictions Weight Bearing Restrictions Per Provider Order: No      Mobility  Bed  Mobility Overal bed mobility: Needs Assistance Bed Mobility: Supine to Sit, Sit to Supine     Supine to sit: +2 for safety/equipment, +2 for physical assistance, Max assist, HOB elevated, Used rails Sit to supine: Max assist, +2 for physical assistance, +2 for safety/equipment   General bed mobility comments: HOB raised, assisted legs over bed edge, assist to  sit upright. Patient able to self assist to boost to scoot to  [facility] while seated on bed edge with mod assistance. max Assist legs back onto bed, assist trunk    Transfers Overall transfer level: Needs assistance Equipment used: Rolling walker (2 wheels) Transfers: Bed to chair/wheelchair/BSC, Sit to/from Stand Sit to Stand: Max assist, +2 physical assistance, +2 safety/equipment, From elevated surface           General transfer comment: patient assisted to stand  x 2 at Rw ,Stood only a few seconds each  attempt.    Ambulation/Gait                  Stairs            Wheelchair Mobility     Tilt Bed    Modified Rankin (Stroke Patients Only)       Balance Overall balance assessment: Needs assistance Sitting-balance support: Bilateral upper extremity supported, Feet supported Sitting balance-Leahy Scale: Poor   Postural control: Posterior lean Standing balance support: Bilateral upper extremity supported, Reliant on assistive device for balance, During functional activity Standing balance-Leahy Scale: Poor Standing balance comment: Reliant on BUE suport  Pertinent Vitals/Pain Pain Assessment Pain Assessment: Faces Faces Pain Scale: Hurts whole lot Pain Location: right leg, all over Pain Descriptors / Indicators: Aching, Sore Pain Intervention(s): Monitored during session, Limited activity within patient's tolerance, Premedicated before session    Home Living Family/patient expects to be discharged to:: Private residence Living Arrangements:  Children Available Help at Discharge: Family;Available 24 hours/day Type of Home: House Home Access: Stairs to enter Entrance Stairs-Rails: None Entrance Stairs-Number of Steps: 2   Home Layout: One level Home Equipment: Rollator (4 wheels);Other (comment);BSC/3in1;Shower seat;Adaptive equipment;Cane - single point Additional Comments: sponge bathes at home    Prior Function Prior Level of Function : Independent/Modified Independent;History of Falls (last six months);Other (comment)             Mobility Comments: Use of rollator- since snf rehab was having difficulty with all mobility, ambulated  with Rw x 30' CGA at DC on  8/8 ADLs Comments: Use of reacher for LB ADLs, son manages meds and finances     Extremity/Trunk Assessment   Upper Extremity Assessment Upper Extremity Assessment: Right hand dominant;Defer to OT evaluation RUE Sensation: WNL RUE Coordination: decreased fine motor;decreased gross motor    Lower Extremity Assessment Lower Extremity Assessment: Generalized weakness;RLE deficits/detail RLE Deficits / Details: noted edema on the  leg    Cervical / Trunk Assessment Cervical / Trunk Assessment: Normal  Communication   Communication Communication: Impaired Factors Affecting Communication: Reduced clarity of speech    Cognition Arousal: Alert     PT - Cognitive impairments: Difficult to assess                       PT - Cognition Comments: patient difficult to understand speech, oriented to hospital and day  and Mo, yr Following commands: Intact       Cueing Cueing Techniques: Verbal cues, Tactile cues     General Comments      Exercises     Assessment/Plan    PT Assessment Patient needs continued PT services  PT Problem List Decreased strength;Decreased activity tolerance;Decreased balance;Decreased mobility;Decreased safety awareness;Pain       PT Treatment Interventions DME instruction;Balance training;Gait training;Stair  training;Functional mobility training;Therapeutic activities;Therapeutic exercise;Patient/family education;Neuromuscular re-education    PT Goals (Current goals can be found in the Care Plan section)  Acute Rehab PT Goals Patient Stated Goal: none stated PT Goal Formulation: With patient Time For Goal Achievement: 09/04/23 Potential to Achieve Goals: Fair    Frequency Min 2X/week     Co-evaluation               AM-PAC PT 6 Clicks Mobility  Outcome Measure Help needed turning from your back to your side while in a flat bed without using bedrails?: A Lot Help needed moving from lying on your back to sitting on the side of a flat bed without using bedrails?: Total Help needed moving to and from a bed to a chair (including a wheelchair)?: Total Help needed standing up from a chair using your arms (e.g., wheelchair or bedside chair)?: Total Help needed to walk in hospital room?: Total Help needed climbing 3-5 steps with a railing? : Total 6 Click Score: 7    End of Session Equipment Utilized During Treatment: Gait belt Activity Tolerance: Patient limited by fatigue Patient left: in bed;with call bell/phone within reach;with bed alarm set Nurse Communication: Mobility status PT Visit Diagnosis: Unsteadiness on feet (R26.81);Other abnormalities of gait and mobility (R26.89);Muscle weakness (generalized) (M62.81);Difficulty in walking, not  elsewhere classified (R26.2);Pain Pain - Right/Left: Right Pain - part of body: Leg    Time: 8947-8884 PT Time Calculation (min) (ACUTE ONLY): 23 min   Charges:   PT Evaluation $PT Eval Low Complexity: 1 Low PT Treatments $Therapeutic Activity: 8-22 mins PT General Charges $$ ACUTE PT VISIT: 1 Visit         Darice Potters PT Acute Rehabilitation Services Office [phone]   Potters Darice Norris 08/21/2023, 1:20 PM

## 2023-08-21 NOTE — Evaluation (Signed)
 Occupational Therapy Evaluation Patient Details Name: Edgar Mckay. MRN: [id number] DOB: 12-Feb-1935 Today's Date: 08/21/2023   History of Present Illness   Edgar Mckay. is a 88 y.o. male home with son last week from hospital stay now readmitted with FTT/Covid 19, generalized weakness PMH: chronic Foley catheter for urinary retention, TIA, anxiety, debility, recent admission from 7/11-17 with complicated UTI/catheter associated UTI discharged to SNF, readmitted and discharged to home     Clinical Impressions Patient known to this OT from last admission with discharge home with son and HHOT. Patient now returning due to inability to manage again in home with rollator for mobility and BADL's with significant assist. Had been mod I with rollator and sponge bathing prior to 07/2023 with support from son for LB ADL's and IADL's and chronic Foley.  Currently, patient presents with deficits outlined below (see OT Problem List for details) most significantly significant decreased activity tolerance, balance, generalized muscle weakness, pain limiting all aspects of BADL's and functional mobility requiring + 2 to EOB and declining to move to recliner with lift equipment this session due to fatigue and requesting need for more rest . Patient requires continued Acute care hospital level OT services to progress safety and functional performance and allow for discharge. Patient will benefit from continued inpatient follow up therapy, <3 hours/day.        If plan is discharge home, recommend the following:   Two people to help with walking and/or transfers;Two people to help with bathing/dressing/bathroom;Assistance with feeding;Assistance with cooking/housework;Direct supervision/assist for medications management;Direct supervision/assist for financial management;Assist for transportation;Help with stairs or ramp for entrance     Functional Status Assessment   Patient has had a recent decline  in their functional status and demonstrates the ability to make significant improvements in function in a reasonable and predictable amount of time.     Equipment Recommendations   Other (comment) (TBD post rehab)      Precautions/Restrictions   Precautions Precautions: Fall Recall of Precautions/Restrictions: Intact Precaution/Restrictions Comments: Air/Contact Restrictions Weight Bearing Restrictions Per Provider Order: No     Mobility Bed Mobility Overal bed mobility: Needs Assistance Bed Mobility: Rolling, Supine to Sit, Sit to Supine Rolling: Max assist, Used rails   Supine to sit: +2 for physical assistance, +2 for safety/equipment, Max assist, HOB elevated, Used rails Sit to supine: +2 for physical assistance, +2 for safety/equipment, Max assist, HOB elevated, Used rails   General bed mobility comments: significant effort, weakness issues and increased time, has difficulty reaching for rails d/t limited UB ROM    Transfers Overall transfer level:  (made it EOB and patient deferred trial with + 2 STEDY for STS or OOB to recliner despite strong encouragement, report he felt too weak)                        Balance Overall balance assessment: Needs assistance Sitting-balance support: Bilateral upper extremity supported, Feet supported Sitting balance-Leahy Scale: Poor   Postural control: Posterior lean                                 ADL either performed or assessed with clinical judgement   ADL Overall ADL's : Needs assistance/impaired Eating/Feeding: Set up;Bed level   Grooming: Wash/dry hands;Wash/dry face;Oral care;Bed level;Minimal assistance   Upper Body Bathing: Moderate assistance;Bed level   Lower Body Bathing: Total assistance;Bed level   Upper Body  Dressing : Maximal assistance;Sitting Upper Body Dressing Details (indicate cue type and reason): EOB with poor stability Lower Body Dressing: Total assistance;+2 for physical  assistance;+2 for safety/equipment;Bed level   Toilet Transfer:  (unable to come to stand this session to commode, has Foley permanently)   Toileting- Clothing Manipulation and Hygiene: Total assistance Toileting - Clothing Manipulation Details (indicate cue type and reason): Foley     Functional mobility during ADLs: +2 for physical assistance;+2 for safety/equipment;Maximal assistance (EOB tolerated only this session with need for +2 for trial STEDY use but patient declined due to fatigue) General ADL Comments: significant overall weakness and fatigue     Vision Patient Visual Report: No change from baseline;Blurring of vision;Other (comment) (low vision at baseline) Additional Comments: baseline bluriness            Pertinent Vitals/Pain Pain Assessment Pain Assessment: Faces Faces Pain Scale: Hurts little more Pain Location: all over Pain Descriptors / Indicators: Aching, Sore Pain Intervention(s): Limited activity within patient's tolerance, Monitored during session, Premedicated before session, Repositioned     Extremity/Trunk Assessment Upper Extremity Assessment Upper Extremity Assessment: Right hand dominant;Generalized weakness;RUE deficits/detail;LUE deficits/detail RUE Deficits / Details: Pt has decrease in AROM in BUE to ~0- 50 degrees RUE: Shoulder pain with ROM RUE Sensation: WNL RUE Coordination: decreased fine motor;decreased gross motor LUE Deficits / Details: ~ 0-65 degrees AROM shoulder LUE: Shoulder pain with ROM LUE Coordination: decreased fine motor   Lower Extremity Assessment Lower Extremity Assessment: Generalized weakness   Cervical / Trunk Assessment Cervical / Trunk Assessment: Normal   Communication Communication Communication: Impaired Factors Affecting Communication: Reduced clarity of speech   Cognition Arousal: Alert Behavior During Therapy:  (somewhat low frustration tolerance) Cognition: No apparent impairments                                Following commands: Intact       Cueing  General Comments   Cueing Techniques: Verbal cues;Tactile cues  reinforced Flutter due to some coughing and running nose, encouraged fluids and activity/OOB when able to tolerate   Exercises Exercises: Other exercises (breathing integration)        Home Living Family/patient expects to be discharged to:: Private residence Living Arrangements: Children Available Help at Discharge: Family;Available 24 hours/day Type of Home: House Home Access: Stairs to enter [employer] of Steps: 2 Entrance Stairs-Rails: None Home Layout: One level     Bathroom Shower/Tub: Tub/shower unit;Curtain   Bathroom Toilet: Standard Bathroom Accessibility: No   Home Equipment: Rollator (4 wheels);Other (comment);BSC/3in1;Shower seat;Adaptive equipment;Cane - single point Adaptive Equipment: Reacher Additional Comments: sponge bathes at home  Lives With: Family    Prior Functioning/Environment Prior Level of Function : Independent/Modified Independent;History of Falls (last six months);Other (comment) (prior to July, mod I, left here at Supervision with rollator)             Mobility Comments: Use of rollator- since snf rehab was having difficulty with all mobility ADLs Comments: Use of reacher for LB ADLs, son manages meds and finances    OT Problem List: Pain;Decreased strength;Decreased range of motion;Decreased activity tolerance;Impaired balance (sitting and/or standing);Decreased safety awareness;Decreased knowledge of use of DME or AE;Decreased knowledge of precautions;Cardiopulmonary status limiting activity;Obesity;Impaired UE functional use;Increased edema   OT Treatment/Interventions: Self-care/ADL training;Therapeutic exercise;Energy conservation;DME and/or AE instruction;Therapeutic activities;Patient/family education;Balance training      OT Goals(Current goals can be found in the care plan  section)  Acute Rehab OT Goals Patient Stated Goal: to get better OT Goal Formulation: With patient Time For Goal Achievement: 09/04/23 Potential to Achieve Goals: Fair ADL Goals Pt Will Perform Lower Body Bathing: with min assist;with adaptive equipment;sit to/from stand Pt Will Perform Lower Body Dressing: with min assist;sit to/from stand;with adaptive equipment Pt Will Transfer to Toilet: with mod assist;bedside commode;stand pivot transfer Pt Will Perform Toileting - Clothing Manipulation and hygiene: with min assist;sitting/lateral leans Pt/caregiver will Perform Home Exercise Program: Increased strength;Increased ROM;With Supervision;With written HEP provided Additional ADL Goal #1: Patient will teach back breathing strategies and ECT during ADL's and mobility with min cues   OT Frequency:  Min 2X/week       AM-PAC OT 6 Clicks Daily Activity     Outcome Measure Help from another person eating meals?: A Little Help from another person taking care of personal grooming?: A Lot Help from another person toileting, which includes using toliet, bedpan, or urinal?: Total Help from another person bathing (including washing, rinsing, drying)?: Total Help from another person to put on and taking off regular upper body clothing?: A Lot Help from another person to put on and taking off regular lower body clothing?: Total 6 Click Score: 10   End of Session Equipment Utilized During Treatment: Gait belt Nurse Communication: Mobility status;Need for lift equipment  Activity Tolerance: Patient limited by fatigue Patient left: in bed;with call bell/phone within reach;with bed alarm set  OT Visit Diagnosis: Unsteadiness on feet (R26.81);Other abnormalities of gait and mobility (R26.89);Muscle weakness (generalized) (M62.81);Pain;Adult, failure to thrive (R62.7) Pain - part of body:  (overall)                Time: 9144-9067 OT Time Calculation (min): 37 min Charges:  OT General  Charges $OT Visit: 1 Visit OT Evaluation $OT Eval Moderate Complexity: 1 Mod OT Treatments $Self Care/Home Management : 8-22 mins  Queen [place]ett OT/L Acute Rehabilitation Department  [phone]  08/21/2023, 12:23 PM

## 2023-08-21 NOTE — Plan of Care (Signed)
  Problem: Coping: Goal: Psychosocial and spiritual needs will be supported Outcome: Progressing   Problem: Education: Goal: Knowledge of risk factors and measures for prevention of condition will improve Outcome: Progressing   Problem: Clinical Measurements: Goal: Ability to maintain clinical measurements within normal limits will improve Outcome: Progressing Goal: Will remain free from infection Outcome: Progressing Goal: Diagnostic test results will improve Outcome: Progressing Goal: Respiratory complications will improve Outcome: Progressing Goal: Cardiovascular complication will be avoided Outcome: Progressing

## 2023-08-22 DIAGNOSIS — R627 Adult failure to thrive: Secondary | ICD-10-CM | POA: Diagnosis not present

## 2023-08-22 LAB — CBC WITH DIFFERENTIAL/PLATELET
Abs Immature Granulocytes: 0.06 K/uL (ref 0.00–0.07)
Basophils Absolute: 0 K/uL (ref 0.0–0.1)
Basophils Relative: 0 %
Eosinophils Absolute: 0 K/uL (ref 0.0–0.5)
Eosinophils Relative: 0 %
HCT: 38.4 % — ABNORMAL LOW (ref 39.0–52.0)
Hemoglobin: 11.9 g/dL — ABNORMAL LOW (ref 13.0–17.0)
Immature Granulocytes: 1 %
Lymphocytes Relative: 14 %
Lymphs Abs: 1.5 K/uL (ref 0.7–4.0)
MCH: 26.7 pg (ref 26.0–34.0)
MCHC: 31 g/dL (ref 30.0–36.0)
MCV: 86.3 fL (ref 80.0–100.0)
Monocytes Absolute: 1.4 K/uL — ABNORMAL HIGH (ref 0.1–1.0)
Monocytes Relative: 13 %
Neutro Abs: 7.9 K/uL — ABNORMAL HIGH (ref 1.7–7.7)
Neutrophils Relative %: 72 %
Platelets: 158 K/uL (ref 150–400)
RBC: 4.45 MIL/uL (ref 4.22–5.81)
RDW: 14.3 % (ref 11.5–15.5)
WBC: 10.9 K/uL — ABNORMAL HIGH (ref 4.0–10.5)
nRBC: 0 % (ref 0.0–0.2)

## 2023-08-22 LAB — BASIC METABOLIC PANEL WITH GFR
Anion gap: 12 (ref 5–15)
BUN: 20 mg/dL (ref 8–23)
CO2: 24 mmol/L (ref 22–32)
Calcium: 8.5 mg/dL — ABNORMAL LOW (ref 8.9–10.3)
Chloride: 103 mmol/L (ref 98–111)
Creatinine, Ser: 1.07 mg/dL (ref 0.61–1.24)
GFR, Estimated: >60 mL/min (ref >60)
Glucose, Bld: 138 mg/dL — ABNORMAL HIGH (ref 70–99)
Potassium: 3.7 mmol/L (ref 3.5–5.1)
Sodium: 139 mmol/L (ref 135–145)

## 2023-08-22 LAB — LACTIC ACID, PLASMA
Lactic Acid, Venous: 2.3 mmol/L (ref 0.5–1.9)
Lactic Acid, Venous: 1.4 mmol/L (ref 0.5–1.9)

## 2023-08-22 MED ORDER — ENOXAPARIN SODIUM 60 MG/0.6ML IJ SOSY
50.0000 mg | PREFILLED_SYRINGE | INTRAMUSCULAR | Status: DC
  Administered 2023-08-22 – 2023-08-26 (×7): 50 mg via SUBCUTANEOUS
  Filled 2023-08-22 (×5): qty 0.6

## 2023-08-22 MED ORDER — ALUM & MAG HYDROXIDE-SIMETH 200-200-20 MG/5ML PO SUSP: 30.0000 mL | Freq: Once | ORAL | Status: DC

## 2023-08-22 MED ORDER — SENNOSIDES-DOCUSATE SODIUM 8.6-50 MG PO TABS
1.0000 | ORAL_TABLET | Freq: Every day | ORAL | Status: DC
  Administered 2023-08-22 – 2023-08-25 (×6): 1 via ORAL
  Filled 2023-08-22 (×4): qty 1

## 2023-08-22 MED ORDER — MELATONIN 3 MG PO TABS
3.0000 mg | ORAL_TABLET | Freq: Every day | ORAL | Status: DC
  Administered 2023-08-22 – 2023-08-25 (×6): 3 mg via ORAL
  Filled 2023-08-22 (×4): qty 1

## 2023-08-22 MED ORDER — ALUM & MAG HYDROXIDE-SIMETH 200-200-20 MG/5ML PO SUSP
30.0000 mL | Freq: Three times a day (TID) | ORAL | Status: DC
  Administered 2023-08-22 – 2023-08-26 (×19): 30 mL via ORAL
  Filled 2023-08-22 (×15): qty 30

## 2023-08-22 MED ORDER — LIDOCAINE VISCOUS HCL 2 % MT SOLN
15.0000 mL | Freq: Three times a day (TID) | OROMUCOSAL | Status: DC
  Administered 2023-08-22 – 2023-08-26 (×19): 15 mL via ORAL
  Filled 2023-08-22 (×18): qty 15

## 2023-08-22 MED ORDER — MAGIC MOUTHWASH W/LIDOCAINE: 5.0000 mL | Freq: Three times a day (TID) | ORAL | Status: DC | PRN

## 2023-08-22 MED ORDER — LIDOCAINE VISCOUS HCL 2 % MT SOLN
15.0000 mL | Freq: Once | OROMUCOSAL | Status: DC
  Filled 2023-08-22: qty 15

## 2023-08-22 MED ORDER — LACTATED RINGERS IV BOLUS
500.0000 mL | Freq: Once | INTRAVENOUS | Status: AC
  Administered 2023-08-22 (×2): 500 mL via INTRAVENOUS

## 2023-08-22 NOTE — Evaluation (Signed)
 Clinical/Bedside Swallow Evaluation Patient Details  Name: Edgar DUPRE Sr. MRN: [id number] Date of Birth: 30-Jan-1935  Today's Date: 08/22/2023 Time: SLP Start Time (ACUTE ONLY): 1530 SLP Stop Time (ACUTE ONLY): 1609 SLP Time Calculation (min) (ACUTE ONLY): 39 min  Past Medical History:  Past Medical History:  Diagnosis Date   Urinary retention    Past Surgical History:  Past Surgical History:  Procedure Laterality Date   PROSTATE BIOPSY  1990   HPI:  Pt is an 88 yo male admitted to [facility] 2 days prior found to have COVID.  Past medical history significant for recurrent UTI, fall at home, generalized weakness, mild protein malnutrition, failure to thrive, obstruction of Foley catheter, and TIAs.  Prior brain imaging showed white matter chronic small vessel hide ischemic changes.  Pt found to have a small hiatal hernia.  Stomach is nondistended. No  small bowel obstruction or inflammation. Moderate colonic stool  burden. No colonic inflammatory change. Occasional colonic  diverticula without diverticulitis.  Prior brain imaging 11/2020 Possible 3 mm acute/early subacute infarct within the right  frontoparietal subcortical white matter, versus image noise  artifact.     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     Background moderate/advanced chronic small vessel ischemic changes  within the cerebral white matter and pons. Chronic small vessel  ischemic changes also present within the thalami.     Moderate cerebral atrophy.  Comparatively mild cerebellar atrophy.     Mild paranasal sinus mucosal thickening.  Swallow evaluation ordered  - due to pt coughing.    Assessment / Plan / Recommendation  Clinical Impression  Patient greeted sitting up in bed with wet voice at baseline.  He  clears this voice with intentional expectoration removing viscous secretions that are clear.  He states he can't swallow well and is noted to wince with every swallow regardless of what he consumes.   Slight facial asymmetry noted otherwise no focal cranial nerve deficits apparent.  Patient willing to consume a very small amount of intake including grapes, applesauce, and water .  Adequate mastication and oral clearance noted but patient winces with every swallow and then is noted to have a wet vocal quality.  Suspect patient has baseline secretion retention in his pharynx that is mobilized with intake.  He is noted to cough after swallow but this is inconsistent-therefore and cannot determine this is related to potential aspiration of p.o.  His chest x-ray was negative upon admission.  His largest barrier to intake right now is his odynophagia-which he reports has been gone going for at least 30 days and is exacerbated currently with this COVID infection.SABRA  He admits that he wants to eat but then as soon as he has pain with swallowing he stops.  He endorses sensation of something sticking at his dentures and also in his upper throat with intake.  States that when he gets intake into his upper esophagus it will eventually clear.  Patient is notable for small hiatal hernia. Recommend consider medical management for his odynophagia and SLP will follow-up to determine indication for instrumental swallow evaluation and for improvement.  Message Dr. Arlice requesting consider patient receiving Magic mouthwash with lidocaine  prior to meals as patient is requesting Maalox and lidocaine  may help his comfort for p.o.  Informed patient of recommendations. SLP Visit  Diagnosis: Dysphagia, unspecified (R13.10)    Aspiration Risk  Mild aspiration risk    Diet Recommendation Regular;Thin liquid    Liquid Administration via: Cup;Straw Medication Administration: Other (Comment) (1 at a time with liquids) Supervision:  Comment;Intermittent supervision to cue for compensatory strategies Compensations: Slow rate;Small sips/bites Postural Changes: Remain upright for at least 30 minutes after po intake;Seated upright at 90 degrees    Other  Recommendations Oral Care Recommendations: Oral care BID Caregiver Recommendations: Have oral suction available     Assistance Recommended at Discharge    Functional Status Assessment    Frequency and Duration min 1 x/week  1 week       Prognosis Prognosis for improved oropharyngeal function: Good Barriers to Reach Goals: Time post onset      Swallow Study   General Date of Onset: 08/22/23 HPI: Pt is an 88 yo male admitted to [facility] 2 days prior found to have COVID.  Past medical history significant for recurrent UTI, fall at home, generalized weakness, mild protein malnutrition, failure to thrive, obstruction of Foley catheter, and TIAs.  Prior brain imaging showed white matter chronic small vessel hide ischemic changes.  Pt found to have a small hiatal hernia.  Stomach is nondistended. No  small bowel obstruction or inflammation. Moderate colonic stool  burden. No colonic inflammatory change. Occasional colonic  diverticula without diverticulitis.  Prior brain imaging 11/2020 Possible 3 mm acute/early subacute infarct within the right  frontoparietal subcortical white matter, versus image noise  artifact.     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     Background moderate/advanced chronic small vessel ischemic changes  within the cerebral white matter and pons. Chronic small vessel  ischemic changes also present within the thalami.     Moderate cerebral atrophy.  Comparatively mild cerebellar atrophy.     Mild paranasal sinus mucosal thickening.  Swallow  evaluation ordered  - due to pt coughing. Type of Study: Bedside Swallow Evaluation Diet Prior to this Study: Regular;Thin liquids (Level 0) Temperature Spikes Noted: No Respiratory Status: Room air History of Recent Intubation: No Behavior/Cognition: Alert;Cooperative;Other (Comment) (pt only willing to take small amounts) Oral Cavity Assessment: Within Functional Limits Oral Care Completed by SLP: No Oral Cavity - Dentition: Dentures, top Vision: Impaired for self-feeding Self-Feeding Abilities: Able to feed self Patient Positioning: Upright in bed Baseline Vocal Quality: Wet;Other (comment) (clears with cued throat clear) Volitional Cough: Congested Volitional Swallow: Unable to elicit    Oral/Motor/Sensory Function Overall Oral Motor/Sensory Function: Other (comment) (slight facial asymmetry, pt able to seal air in his mouth, bilateral palatal elevation)   Ice Chips Ice chips: Not tested   Thin Liquid Thin Liquid: Impaired Presentation: Straw;Self Fed Pharyngeal  Phase Impairments: Cough - Delayed    Nectar Thick Nectar Thick Liquid: Not tested   Honey Thick Honey Thick Liquid: Not tested   Puree Puree: Within functional limits Presentation: Spoon   Solid     Solid: Within functional limits Presentation: Self Fed Other Comments: pt consumed grape --= able to masticate and clear -      Nicolas Emmie Caldron 08/22/2023,4:34 PM   Madelin POUR, MS [facility] SLP Acute Rehab Services Office [phone]

## 2023-08-22 NOTE — TOC Progression Note (Signed)
 Transition of Care (TOC) - Progression Note   Patient Details  Name: Edgar GUINEY Sr. MRN: [id number] Date of Birth: 31-Jul-1935  Transition of Care [facility]) CM/SW Contact  Duwaine GORMAN Aran, LCSW Phone Number: 08/22/2023, 11:56 AM  Clinical Narrative: CSW provided patient with list of bed offers and Medicare star ratings. Patient received the following bed offers:  [facility] [address] [place], [place] [address] [phone] Overall rating ? Much below average  [facility] [address] [place], [place] [address] [phone] Overall rating ?? Below average  [facility] [address] [place], [place] [address] [phone] Overall rating ? Much below average  [facility] [address] [place], [place] [address] [phone] Overall rating ??? Average  [facility] [address] [place], [place] [address] [phone] Overall rating ?? Below average  Patient reported he will need family assistance to review bed offers. CSW explained to patient that SNFs will require patient to be a certain number of days post-COVID test (tested on 08/20/23) before he can be admitted. CSW spoke with son, Edgar Mckay, regarding SNF and he will assist the patient with choosing a facility. Care management awaiting bed choice.  Expected Discharge Plan: Skilled Nursing Facility Barriers to Discharge: Continued Medical Work up, English as a second language teacher  Expected Discharge Plan and Services In-house Referral: Clinical Social Work Discharge Planning Services: [employer] Consult Post Acute Care Choice: Skilled Nursing Facility Living arrangements for the past 2 months: Single Family Home           DME Arranged: N/A DME Agency: NA HH Arranged: NA HH Agency: NA  Social Drivers of Health (SDOH) Interventions SDOH Screenings   Food Insecurity: No Food Insecurity  (08/20/2023)  Housing: Low Risk  (08/20/2023)  Transportation Needs: No Transportation Needs (08/20/2023)  Utilities: Not At Risk (08/20/2023)  Depression (PHQ2-9): Low Risk  (03/25/2021)  Social Connections: Unknown (08/20/2023)  Recent Concern: Social Connections - Socially Isolated (08/16/2023)  Tobacco Use: High Risk (08/20/2023)   Readmission Risk Interventions     No data to display

## 2023-08-22 NOTE — Progress Notes (Signed)
 PROGRESS NOTE  Edgar Mckay Sr.  DOB: [birth year]  PCP: [facility] FMW:[id number]  DOA: 08/20/2023  LOS: 2 days  Hospital Day: 3  Brief narrative: Edgar [name] is a 88 y.o. male with PMH significant for chronic urinary retention s/p chronic Foley, recurrent UTI, debility, anxiety, who has been hospitalized twice in the past 30 days due to generalized weakness and complicated UTI  8/10, patient presented to ED with complaint of generalized weakness. His son was helping him to go to the bathroom at home, the patient became very weak and slid to the floor.  Patient also reports myalgia, mild sore throat, rhinorrhea.  No fever. On the most recent admission the patient was offered to go to a skilled nursing facility for rehab, but he declined it and went home.  In the ED, patient was afebrile, hemodynamically stable Initial labs with CBC unremarkable, CMP unremarkable Respiratory virus panel with COVID PCR positive Urinalysis showed cloudy yellow urine with moderate hemoglobin, large leukocytes, rare bacteria Chest x-ray unremarkable  Subjective: Patient was seen and examined this morning.   I was paged at bedside by RN today with the concern of acute right upper extreme weakness.   At the time of my evaluation, patient was alert, awake slow to respond but able to answer questions.  He stated that he has difficulty with holding stuff using his right hand.  It has been going on for last several months.  It is intermittently worse.  He does not recall having a stroke in the past but review of his MRI, he has several   Tmax 102.4 yesterday afternoon.  Overnight, had low-grade temperature.  No large fever spike.  Assessment and plan: COVID-19 virus infection Presented with generalized weakness COVID PCR positive.  Not hypoxic.  Chest x-ray without infiltrates Not started on antiviral or steroids Complains of cough.  On Mucinex  twice daily.  Will add Tessalon  Perles  as well Recent Labs  Lab 08/15/23 1814 08/20/23 0915 08/20/23 1031 08/21/23 0420 08/22/23 0404 08/22/23 1216  SARSCOV2NAA  --   --  POSITIVE*  --   --   --   WBC 8.0 8.0  --  7.1 10.9*  --   LATICACIDVEN  --   --   --   --  2.3* 1.4  ALT 25 34  --  34  --   --     SIRS 8/11, patient had fever spikes.  I sent for blood culture and started on empiric IV Rocephin  given possibility of UTI associated with chronic Foley catheter. Fever spikes improving. Lactic acid level was slightly elevated this morning and improved  Chronic urinary retention S/p chronic indwelling catheter. With history of recurrent UTIs.  Chronic right upper extremity weakness Although patient is unable to recall any history of stroke, on chart review I noted that 22 had showed chronic cortical subcortical in the right frontal lobe, chronic small vessel infarct in the right centrum semiovale, basal ganglia, right pons, background moderate to advanced chronic small vessel ischemic changes in the white matter and pons, moderate cerebral atrophy, mild cerebral atrophy PT/OT eval ordered.  Mild protein calorie malnutrition Dietitian consulted  Class I obesity  Body mass index is 32.13 kg/m. Patient has been advised to make an attempt to improve diet and exercise patterns to aid in weight loss.   Mobility:  PT Orders: Active   PT Follow up Rec: Skilled Nursing-Short Term Rehab (<3 Hours/Day)08/21/2023 1317   Goals of care  Code Status: Full Code     DVT prophylaxis:  Lovenox  subcu   Antimicrobials: None Fluid: None.  Encourage oral hydration Consultants: None Family Communication: None at bedside  Status: Inpatient Level of care:  Progressive   Patient is from: Home Needs to continue in-hospital care: Continue to monitor fever spikes, pending blood culture report Anticipated d/c to: PT recommended SNF    Diet:  Diet Order             Diet Heart Fluid consistency: Thin  Diet effective now                    Scheduled Meds:  benzonatate   100 mg Oral TID   Chlorhexidine  Gluconate Cloth  6 each Topical Daily   enoxaparin  (LOVENOX ) injection  50 mg Subcutaneous Q24H   guaiFENesin   600 mg Oral BID    PRN meds: acetaminophen  **OR** acetaminophen , ibuprofen , ondansetron  **OR** ondansetron  (ZOFRAN ) IV, polyethylene glycol   Infusions:   cefTRIAXone  (ROCEPHIN )  IV 1 g (08/22/23 1602)    Antimicrobials: Anti-infectives (From admission, onward)    Start     Dose/Rate Route Frequency Ordered Stop   08/21/23 1500  cefTRIAXone  (ROCEPHIN ) 1 g in sodium chloride  0.9 % 100 mL IVPB        1 g 200 mL/hr over 30 Minutes Intravenous Every 24 hours 08/21/23 1429         Objective: Vitals:   08/22/23 0800 08/22/23 1316  BP:  (!) 149/84  Pulse:  82  Resp:  16  Temp:  100.3 F (37.9 C)  SpO2: 97% 98%    Intake/Output Summary (Last 24 hours) at 08/22/2023 1605 Last data filed at 08/22/2023 1422 Gross per 24 hour  Intake --  Output 150 ml  Net -150 ml   Filed Weights   08/20/23 1455 08/21/23 2100  Weight: 104.3 kg 98.7 kg   Weight change: -5.627 kg Body mass index is 32.13 kg/m.   Physical Exam: General exam: Pleasant, elderly African-American male..  Feels weak Skin: No rashes, lesions or ulcers. HEENT: Atraumatic, normocephalic, no obvious bleeding Lungs: Clear to auscultation bilaterally, no crackles or wheezing CVS: S1, S2, no murmur,   GI/Abd: Soft, nontender, nondistended, bowel sound present,   CNS: Alert, awake, oriented to place and person.  Slow to respond.  Chronic right upper extremity weakness Psychiatry: Sad affect Extremities: No pedal edema, no calf tenderness,   Data Review: I have personally reviewed the laboratory data and studies available.  F/u labs ordered Unresulted Labs (From admission, onward)    None      Signed, Chapman Rota, MD [facility] 08/22/2023

## 2023-08-23 DIAGNOSIS — R627 Adult failure to thrive: Secondary | ICD-10-CM | POA: Diagnosis not present

## 2023-08-23 LAB — CBC WITH DIFFERENTIAL/PLATELET
Abs Immature Granulocytes: 0.03 K/uL (ref 0.00–0.07)
Basophils Absolute: 0 K/uL (ref 0.0–0.1)
Basophils Relative: 1 %
Eosinophils Absolute: 0 K/uL (ref 0.0–0.5)
Eosinophils Relative: 0 %
HCT: 41.5 % (ref 39.0–52.0)
Hemoglobin: 12.6 g/dL — ABNORMAL LOW (ref 13.0–17.0)
Immature Granulocytes: 1 %
Lymphocytes Relative: 19 %
Lymphs Abs: 1.2 K/uL (ref 0.7–4.0)
MCH: 27 pg (ref 26.0–34.0)
MCHC: 30.4 g/dL (ref 30.0–36.0)
MCV: 88.9 fL (ref 80.0–100.0)
Monocytes Absolute: 0.8 K/uL (ref 0.1–1.0)
Monocytes Relative: 12 %
Neutro Abs: 4.3 K/uL (ref 1.7–7.7)
Neutrophils Relative %: 67 %
Platelets: 154 K/uL (ref 150–400)
RBC: 4.67 MIL/uL (ref 4.22–5.81)
RDW: 14.5 % (ref 11.5–15.5)
Smear Review: NORMAL
WBC: 6.4 K/uL (ref 4.0–10.5)
nRBC: 0 % (ref 0.0–0.2)

## 2023-08-23 LAB — BASIC METABOLIC PANEL WITH GFR
Anion gap: 11 (ref 5–15)
BUN: 27 mg/dL — ABNORMAL HIGH (ref 8–23)
CO2: 23 mmol/L (ref 22–32)
Calcium: 8.4 mg/dL — ABNORMAL LOW (ref 8.9–10.3)
Chloride: 105 mmol/L (ref 98–111)
Creatinine, Ser: 1.12 mg/dL (ref 0.61–1.24)
GFR, Estimated: >60 mL/min (ref >60)
Glucose, Bld: 135 mg/dL — ABNORMAL HIGH (ref 70–99)
Potassium: 3.6 mmol/L (ref 3.5–5.1)
Sodium: 139 mmol/L (ref 135–145)

## 2023-08-23 MED ORDER — NYSTATIN 100000 UNIT/ML MT SUSP
5.0000 mL | Freq: Four times a day (QID) | OROMUCOSAL | Status: DC
  Administered 2023-08-23 – 2023-08-26 (×15): 500000 [IU] via ORAL
  Filled 2023-08-23 (×13): qty 5

## 2023-08-23 NOTE — Progress Notes (Signed)
 PROGRESS NOTE  Edgar Mckay.  DOB: 05/09/1935  PCP: [facility] FMW:[id number]  DOA: 08/20/2023  LOS: 3 days  Hospital Day: 4  Brief narrative: Edgar JAYSON Favor Mckay. is a 88 y.o. male with PMH significant for chronic urinary retention s/p chronic Foley, recurrent UTI, debility, anxiety, who has been hospitalized twice in the past 30 days due to generalized weakness and complicated UTI  8/10, patient presented to ED with complaint of generalized weakness. His son was helping him to go to the bathroom at home, the patient became very weak and slid to the floor.  Patient also reports myalgia, mild sore throat, rhinorrhea.  No fever. On the most recent admission the patient was offered to go to a skilled nursing facility for rehab, but he declined it and went home. In the ED, patient was afebrile, hemodynamically stable. Initial labs with CBC unremarkable, CMP unremarkable Respiratory virus panel with COVID PCR positive. Urinalysis showed cloudy yellow urine with moderate hemoglobin, large leukocytes, rare bacteria. Chest x-ray unremarkable  Subjective: Patient continues to complain of odynophagia.  Still noted to be coughing.  Appears weak/deconditioned  Assessment and plan:  COVID-19 virus infection Presented with generalized weakness COVID PCR positive.  Not hypoxic.  Chest x-ray without infiltrates Not started on antiviral or steroids On Mucinex  twice daily, Tessalon  Perles as well   SIRS 8/11, patient had fever spikes blood culture x 2, no growth till date, no urine culture obtained Continue empiric IV Rocephin  given possibility of UTI associated with chronic Foley catheter  Odynophagia Unknown etiology, possible noted oral thrush Continue Magic wash/lidocaine , started on nystatin  swish and swallow  Chronic urinary retention S/p chronic indwelling catheter. With history of recurrent UTIs.  Chronic right upper extremity weakness Although patient is  unable to recall any history of stroke, on chart review I noted that CT in 2022 had showed chronic cortical subcortical in the right frontal lobe, chronic small vessel infarct in the right centrum semiovale, basal ganglia, right pons, background moderate to advanced chronic small vessel ischemic changes in the white matter and pons, moderate cerebral atrophy, mild cerebral atrophy PT/OT eval ordered.  Mild protein calorie malnutrition Dietitian consulted  Class I obesity  Body mass index is 32.13 kg/m. Patient has been advised to make an attempt to improve diet and exercise patterns to aid in weight loss.   Mobility:  PT Orders: Active   PT Follow up Rec: Skilled Nursing-Short Term Rehab (<3 Hours/Day)08/23/2023 1300   Goals of care   Code Status: Full Code     DVT prophylaxis:  Lovenox  subcu   Antimicrobials: Ceftriaxone  Fluid: None.  Encourage oral hydration Consultants: None Family Communication: None at bedside  Status: Inpatient Level of care:  Progressive   Patient is from: Home Needs to continue in-hospital care: Continue to monitor fever spikes, pending blood culture report Anticipated d/c to: PT recommended SNF    Diet:  Diet Order             Diet Heart Fluid consistency: Thin  Diet effective now                   Scheduled Meds:  alum & mag hydroxide-simeth  30 mL Oral TID WC & HS   And   lidocaine   15 mL Oral TID WC & HS   benzonatate   100 mg Oral TID   Chlorhexidine  Gluconate Cloth  6 each Topical Daily   enoxaparin  (LOVENOX ) injection  50 mg Subcutaneous Q24H  guaiFENesin   600 mg Oral BID   melatonin  3 mg Oral QHS   nystatin   5 mL Oral QID   senna-docusate  1 tablet Oral QHS    PRN meds: acetaminophen  **OR** acetaminophen , ondansetron  **OR** ondansetron  (ZOFRAN ) IV, polyethylene glycol   Infusions:   cefTRIAXone  (ROCEPHIN )  IV 1 g (08/23/23 1648)    Antimicrobials: Anti-infectives (From admission, onward)    Start      Dose/Rate Route Frequency Ordered Stop   08/21/23 1500  cefTRIAXone  (ROCEPHIN ) 1 g in sodium chloride  0.9 % 100 mL IVPB        1 g 200 mL/hr over 30 Minutes Intravenous Every 24 hours 08/21/23 1429         Objective: Vitals:   08/23/23 0845 08/23/23 1159  BP:  122/62  Pulse:  79  Resp: (!) 24 17  Temp:  100 F (37.8 C)  SpO2: 90% 94%    Intake/Output Summary (Last 24 hours) at 08/23/2023 1937 Last data filed at 08/23/2023 1130 Gross per 24 hour  Intake 250 ml  Output --  Net 250 ml   Filed Weights   08/20/23 1455 08/21/23 2100  Weight: 104.3 kg 98.7 kg   Weight change:  Body mass index is 32.13 kg/m.   Physical Exam: General: NAD  Cardiovascular: S1, S2 present Respiratory: CTAB Abdomen: Soft, nontender, nondistended, bowel sounds present Musculoskeletal: No bilateral pedal edema noted, chronic right upper extremity weakness Skin: Normal Psychiatry: Normal mood   Data Review: I have personally reviewed the laboratory data and studies available.  F/u labs ordered Unresulted Labs (From admission, onward)     Start     Ordered   08/23/23 0856  CBC with Differential/Platelet  Daily,   R     Question:  Specimen collection method  Answer:  Lab=Lab collect   08/23/23 0855   08/23/23 0856  Basic metabolic panel with GFR  Daily,   R     Question:  Specimen collection method  Answer:  Lab=Lab collect   08/23/23 0855           Signed, Lebron JINNY Cage, MD [facility] 08/23/2023

## 2023-08-23 NOTE — Progress Notes (Signed)
 Physical Therapy Treatment Patient Details Name: Edgar RODIER Sr. MRN: [id number] DOB: 11/30/1935 Today's Date: 08/23/2023   History of Present Illness Edgar GREENLAW Sr. is a 88 y.o. male home with son last week from hospital stay now readmitted with FTT/Covid 19, generalized weakness PMH: chronic Foley catheter for urinary retention, TIA, anxiety, debility, recent admission from 7/11-17 with complicated UTI/catheter associated UTI discharged to SNF, readmitted and discharged to home    PT Comments   Pt admitted with above diagnosis.  Pt currently with functional limitations due to the deficits listed below (see PT Problem List). Pt in bed when PT arrived. Pt required encouragement for participation with therapy pt expressed that he felt the end was near and that he just wanted to close his eyes. Nursing staff made aware of pt report. Pt required max A x 2 for bed mobility, max A x 2 for sit to stand  from elevated surfaces and total A x 2 for safe SPT to recliner with pt exhibiting difficulty with pivoting, electing to sit prior to proper alignment with recliner and absent eccentric  control to recliner. Pt left in bed, all needs in place, 91% on RA, nursing aware of apparent dysfunction with foley and pt report of feeling close to death. Pt will benefit from acute skilled PT to increase their independence and safety with mobility to allow discharge.      If plan is discharge home, recommend the following: Two people to help with walking and/or transfers;A lot of help with bathing/dressing/bathroom;Assistance with cooking/housework;Help with stairs or ramp for entrance   Can travel by private vehicle     No  Equipment Recommendations  None recommended by PT    Recommendations for Other Services       Precautions / Restrictions Precautions Precautions: Fall Recall of Precautions/Restrictions: Intact Precaution/Restrictions Comments: Air/Contact Restrictions Weight Bearing  Restrictions Per Provider Order: No     Mobility  Bed Mobility Overal bed mobility: Needs Assistance Bed Mobility: Supine to Sit     Supine to sit: +2 for safety/equipment, +2 for physical assistance, Max assist, HOB elevated, Used rails     General bed mobility comments: max A x 2 with minimal initiation of movement and use of bed pad to complete to EOB    Transfers Overall transfer level: Needs assistance Equipment used: Rolling walker (2 wheels) Transfers: Bed to chair/wheelchair/BSC, Sit to/from Stand Sit to Stand: Max assist, +2 physical assistance, +2 safety/equipment, From elevated surface   Step pivot transfers: From elevated surface, Total assist, +2 physical assistance, +2 safety/equipment       General transfer comment: pt required encouragement for participation and to transfer to recliner, pt bed soiled suspect to foley, pt required max a x 2 for sit to stand from elevated surface, SPT mod A x 2 however pt quickly fatigued and faded to total A x 2 with absent eccentric control to recliner    Ambulation/Gait               General Gait Details: NT   Stairs             Wheelchair Mobility     Tilt Bed    Modified Rankin (Stroke Patients Only)       Balance Overall balance assessment: Needs assistance Sitting-balance support: Bilateral upper extremity supported, Feet supported Sitting balance-Leahy Scale: Poor Sitting balance - Comments: min to mod A for sitting EOB with inability to attend to midline Postural control: Posterior lean, Right lateral  lean Standing balance support: Bilateral upper extremity supported, Reliant on assistive device for balance, During functional activity Standing balance-Leahy Scale: Zero Standing balance comment: Reliant on BUE suport max/mod A x 2 for standing balance                            Communication Communication Communication: Impaired Factors Affecting Communication: Reduced clarity of  speech  Cognition Arousal: Alert Behavior During Therapy: WFL for tasks assessed/performed   PT - Cognitive impairments: No apparent impairments                         Following commands: Intact      Cueing Cueing Techniques: Verbal cues, Tactile cues  Exercises      General Comments        Pertinent Vitals/Pain Pain Assessment Pain Assessment: 0-10 Pain Score: 4  Pain Location: right leg, all over Pain Descriptors / Indicators: Aching, Sore Pain Intervention(s): Limited activity within patient's tolerance, Monitored during session, Repositioned    Home Living                          Prior Function            PT Goals (current goals can now be found in the care plan section) Acute Rehab PT Goals Patient Stated Goal: none stated PT Goal Formulation: With patient Time For Goal Achievement: 09/04/23 Potential to Achieve Goals: Fair Progress towards PT goals: Progressing toward goals (slow progression)    Frequency    Min 2X/week      PT Plan      Co-evaluation              AM-PAC PT 6 Clicks Mobility   Outcome Measure  Help needed turning from your back to your side while in a flat bed without using bedrails?: A Lot Help needed moving from lying on your back to sitting on the side of a flat bed without using bedrails?: Total Help needed moving to and from a bed to a chair (including a wheelchair)?: Total Help needed standing up from a chair using your arms (e.g., wheelchair or bedside chair)?: Total Help needed to walk in hospital room?: Total Help needed climbing 3-5 steps with a railing? : Total 6 Click Score: 7    End of Session Equipment Utilized During Treatment: Gait belt Activity Tolerance: Patient limited by fatigue Patient left: with call bell/phone within reach;in chair;with chair alarm set Nurse Communication: Mobility status PT Visit Diagnosis: Unsteadiness on feet (R26.81);Other abnormalities of gait and  mobility (R26.89);Muscle weakness (generalized) (M62.81);Difficulty in walking, not elsewhere classified (R26.2);Pain Pain - Right/Left: Right Pain - part of body: Leg     Time: 8944-8884 PT Time Calculation (min) (ACUTE ONLY): 20 min  Charges:    $Therapeutic Activity: 8-22 mins PT General Charges $$ ACUTE PT VISIT: 1 Visit                     [place], PT Acute Rehab    [place] VEAR Drone 08/23/2023, 1:44 PM

## 2023-08-23 NOTE — Plan of Care (Signed)
   Problem: Education: Goal: Knowledge of risk factors and measures for prevention of condition will improve Outcome: Progressing   Problem: Coping: Goal: Psychosocial and spiritual needs will be supported Outcome: Progressing   Problem: Respiratory: Goal: Will maintain a patent airway Outcome: Progressing Goal: Complications related to the disease process, condition or treatment will be avoided or minimized Outcome: Progressing

## 2023-08-23 NOTE — Plan of Care (Signed)
  Problem: Activity: Goal: Risk for activity intolerance will decrease Outcome: Progressing   Problem: Respiratory: Goal: Complications related to the disease process, condition or treatment will be avoided or minimized Outcome: Not Progressing   Problem: Clinical Measurements: Goal: Ability to maintain clinical measurements within normal limits will improve Outcome: Not Progressing Goal: Respiratory complications will improve Outcome: Not Progressing   Problem: Nutrition: Goal: Adequate nutrition will be maintained Outcome: Not Progressing

## 2023-08-23 NOTE — TOC Progression Note (Signed)
 Transition of Care (TOC) - Progression Note   Patient Details  Name: Edgar HADDON Sr. MRN: [id number] Date of Birth: 11-Jan-1935  [facility] of [facility]) CM/SW Contact  Duwaine GORMAN Aran, LCSW Phone Number: 08/23/2023, 11:48 AM  Clinical Narrative: Patent's son assisted patient with bed choice for SNF and [facility] was chosen. CSW confirmed bed with Kia in admissions at [facility] and the facility can admit the patient after day 10 (08/30/23) as patient is COVID+. Care management following.  Expected Discharge Plan: Skilled Nursing Facility Barriers to Discharge: Continued Medical Work up, English as a second language teacher  Expected Discharge Plan and Services In-house Referral: Clinical Social Work Discharge Planning Services: [employer] Consult Post Acute Care Choice: Skilled Nursing Facility Living arrangements for the past 2 months: Single Family Home           DME Arranged: N/A DME Agency: NA HH Arranged: NA HH Agency: NA  Social Drivers of Health (SDOH) Interventions SDOH Screenings   Food Insecurity: No Food Insecurity (08/20/2023)  Housing: Low Risk  (08/20/2023)  Transportation Needs: No Transportation Needs (08/20/2023)  Utilities: Not At Risk (08/20/2023)  Depression (PHQ2-9): Low Risk  (03/25/2021)  Social Connections: Unknown (08/20/2023)  Recent Concern: Social Connections - Socially Isolated (08/16/2023)  Tobacco Use: High Risk (08/20/2023)   Readmission Risk Interventions     No data to display

## 2023-08-23 NOTE — Progress Notes (Signed)
 Foley swapped out due to leaking and increased sediment in catheter.

## 2023-08-24 ENCOUNTER — Inpatient Hospital Stay (HOSPITAL_COMMUNITY)

## 2023-08-24 DIAGNOSIS — R627 Adult failure to thrive: Secondary | ICD-10-CM | POA: Diagnosis not present

## 2023-08-24 DIAGNOSIS — R609 Edema, unspecified: Secondary | ICD-10-CM

## 2023-08-24 MED ORDER — GLYCOPYRROLATE 0.2 MG/ML IJ SOLN
0.1000 mg | Freq: Once | INTRAMUSCULAR | Status: AC
  Administered 2023-08-24: 0.1 mg via INTRAVENOUS
  Filled 2023-08-24: qty 1

## 2023-08-24 MED ORDER — METHYLPREDNISOLONE SODIUM SUCC 125 MG IJ SOLR
81.2500 mg | Freq: Every day | INTRAMUSCULAR | Status: DC
  Administered 2023-08-24 – 2023-08-25 (×2): 81.25 mg via INTRAVENOUS
  Filled 2023-08-24 (×2): qty 2

## 2023-08-24 MED ORDER — SODIUM CHLORIDE 0.9 % IV SOLN
3.0000 g | Freq: Four times a day (QID) | INTRAVENOUS | Status: DC
  Administered 2023-08-24 – 2023-08-26 (×9): 3 g via INTRAVENOUS
  Filled 2023-08-24 (×10): qty 8

## 2023-08-24 NOTE — Progress Notes (Signed)
 Bilateral lower extremity venous duplex has been completed. Preliminary results can be found in CV Proc through chart review.   08/24/23 2:28 PM Cathlyn Collet RVT

## 2023-08-24 NOTE — Progress Notes (Addendum)
 PROGRESS NOTE  Edgar RICHISON Sr.  DOB: October 27, 1935  PCP: [facility] FMW:[id number]  DOA: 08/20/2023  LOS: 4 days  Hospital Day: 5  Brief narrative: Edgar [name] is a 88 y.o. male with PMH significant for chronic urinary retention s/p chronic Foley, recurrent UTI, debility, anxiety, who has been hospitalized twice in the past 30 days due to generalized weakness and complicated UTI  8/10, patient presented to ED with complaint of generalized weakness. His son was helping him to go to the bathroom at home, the patient became very weak and slid to the floor.  Patient also reports myalgia, mild sore throat, rhinorrhea.  No fever. On the most recent admission the patient was offered to go to a skilled nursing facility for rehab, but he declined it and went home. In the ED, patient was afebrile, hemodynamically stable. Initial labs with CBC unremarkable, CMP unremarkable Respiratory virus panel with COVID PCR positive. Urinalysis showed cloudy yellow urine with moderate hemoglobin, large leukocytes, rare bacteria. Chest x-ray unremarkable  Subjective: Patient still with odynophagia, likely to be aspirating.  Noted increased secretions.  SLP consulted, appreciate recs    Assessment and plan:  COVID-19 virus infection Presented with generalized weakness COVID PCR positive Not hypoxic Repeat chest x-ray now with infiltrates, right-sided, possible aspiration Bilateral lower extremity Doppler ultrasound negative for DVT Start IV Unasyn , IV Solu-Medrol  On Mucinex  twice daily, Tessalon  Perles as well   SIRS Possible aspiration pneumonia Noted increased secretions, s/p 1 dose of Robinul  8/11, patient had fever spikes blood culture x 2, no growth till date, no urine culture obtained Received 4 days of empiric IV Rocephin  given possibility of UTI associated with chronic Foley catheter Start IV Unasyn  as repeat chest x-ray showed above  Odynophagia Unknown etiology,  possible noted oral thrush Continue Magic wash/lidocaine , started on nystatin  swish and swallow  Chronic urinary retention S/p chronic indwelling catheter. With history of recurrent UTIs.  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  Mild protein calorie malnutrition Dietitian consulted  Class I obesity  Body mass index is 32.13 kg/m. Patient has been advised to make an attempt to improve diet and exercise patterns to aid in weight loss  Goals of care discussion Patient with overall poor prognosis, repeat admissions, advanced age Palliative/hospice consulted   Mobility:  PT Orders: Active   PT Follow up Rec: Skilled Nursing-Short Term Rehab (<3 Hours/Day)08/23/2023 1300   Goals of care   Code Status: Full Code     DVT prophylaxis:  Lovenox  subcu   Antimicrobials: Unasyn  Fluid: None.  Encourage oral hydration Consultants: None Family Communication: None at bedside  Status: Inpatient Level of care:  Progressive   Patient is from: Home Needs to continue in-hospital care: Continue to monitor fever spikes, pending blood culture report Anticipated d/c to: PT recommended SNF    Diet:  Diet Order             Diet Heart Fluid consistency: Thin  Diet effective now                   Scheduled Meds:  alum &  mag hydroxide-simeth  30 mL Oral TID WC & HS   And   lidocaine   15 mL Oral TID WC & HS   benzonatate   100 mg Oral TID   Chlorhexidine  Gluconate Cloth  6 each Topical Daily   enoxaparin  (LOVENOX ) injection  50 mg Subcutaneous Q24H   guaiFENesin    600 mg Oral BID   melatonin  3 mg Oral QHS   nystatin   5 mL Oral QID   senna-docusate  1 tablet Oral QHS    PRN meds: acetaminophen  **OR** acetaminophen , ondansetron  **OR** ondansetron  (ZOFRAN ) IV, polyethylene glycol   Infusions:   ampicillin -sulbactam (UNASYN ) IV Stopped (08/24/23 1834)    Antimicrobials: Anti-infectives (From admission, onward)    Start     Dose/Rate Route Frequency Ordered Stop   08/24/23 1300  Ampicillin -Sulbactam (UNASYN ) 3 g in sodium chloride  0.9 % 100 mL IVPB        3 g 200 mL/hr over 30 Minutes Intravenous Every 6 hours 08/24/23 1157     08/21/23 1500  cefTRIAXone  (ROCEPHIN ) 1 g in sodium chloride  0.9 % 100 mL IVPB  Status:  Discontinued        1 g 200 mL/hr over 30 Minutes Intravenous Every 24 hours 08/21/23 1429 08/24/23 1154       Objective: Vitals:   08/24/23 1116 08/24/23 1603  BP: (!) 123/99 (!) 114/59  Pulse: 78 86  Resp: 16 16  Temp: 98.6 F (37 C) 98 F (36.7 C)  SpO2: 94% 96%    Intake/Output Summary (Last 24 hours) at 08/24/2023 2005 Last data filed at 08/24/2023 1846 Gross per 24 hour  Intake 701.34 ml  Output 800 ml  Net -98.66 ml   Filed Weights   08/20/23 1455 08/21/23 2100  Weight: 104.3 kg 98.7 kg   Weight change:  Body mass index is 32.13 kg/m.   Physical Exam: General: NAD  Cardiovascular: S1, S2 present Respiratory: Diminished breath sounds bilaterally Abdomen: Soft, nontender, nondistended, bowel sounds present Musculoskeletal: No bilateral pedal edema noted, chronic right upper extremity weakness Skin: Normal Psychiatry: Normal mood   Data Review: I have personally reviewed the laboratory data and studies available.  F/u labs ordered Unresulted Labs (From admission, onward)    None      Signed, Lebron JINNY Cage, MD [facility] 08/24/2023

## 2023-08-24 NOTE — Plan of Care (Signed)
   Problem: Education: Goal: Knowledge of risk factors and measures for prevention of condition will improve Outcome: Progressing   Problem: Coping: Goal: Psychosocial and spiritual needs will be supported Outcome: Progressing   Problem: Respiratory: Goal: Will maintain a patent airway Outcome: Progressing Goal: Complications related to the disease process, condition or treatment will be avoided or minimized Outcome: Progressing

## 2023-08-24 NOTE — Progress Notes (Addendum)
 Speech Language Pathology Treatment: Dysphagia  Patient Details Name: Edgar BOETTNER Sr. MRN: [id number] DOB: Apr 26, 1935 Today's Date: 08/24/2023 Time: 8954-8889 SLP Time Calculation (min) (ACUTE ONLY): 25 min  Assessment / Plan / Recommendation Clinical Impression  Pt greeted sitting upright in bed, continues to complain of odynophagia and pain when swallowing secretions - now denying odynophagia until he had COVID. Denies Maalox nor Lidocaine  being helpful to mitigate discomfort. Wet voice noted at baseline - Suspect pt may not be swallowing secretions due to his discomfort, which may contribute to likely secretion aspiration. In addition, ? if his secretions are contributing to throat pain with swallowing. Pt denies any pain at rest - only when swallowing *even saliva. Noted slight white coating on tongue but not in bilateral buccal region and pt denies lingual discomfort.   Pt did not eat breakfat but willing to eat snack that SlP provided *Grandma's cookie* - He consumed 3 bites total - overt wincing noted with swallowing with pt reporting ongoing pain. Prolonged mastication noted but adequate oral clearance. He indicates his pain with swallowing is not better nor worse. No s/s of aspiration with po observed *water  and cookie bites* but pt politely declined to consume more.   Provided him with Ensure Milkshake (Ensure and ice cream) encouraging him to consume it for nutrition/protein. Given this is the pt's second hospital admit and it was advised he go to SNF last time *he went home*, recommend to consider a palliative consult to establish his GOC. He stated he was tired of this    Highly suspect pt is having secretion aspiration and question if he could be provided with anything to help dry the secretions some in hopes to improve PO intake/comfort.     HPI HPI: Pt is an 88 yo male admitted to [facility] 2 days prior found to have COVID.  Past medical history significant for  recurrent UTI, fall at home, generalized weakness, mild protein malnutrition, failure to thrive, obstruction of Foley catheter, and TIAs.  Prior brain imaging showed white matter chronic small vessel hide ischemic changes.  Pt found to have a small hiatal hernia.  Stomach is nondistended. No  small bowel obstruction or inflammation. Moderate colonic stool  burden. No colonic inflammatory change. Occasional colonic  diverticula without diverticulitis.  Prior brain imaging 11/2020 Possible 3 mm acute/early subacute infarct within the right  frontoparietal subcortical white matter, versus image noise  artifact.     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     Background moderate/advanced chronic small vessel ischemic changes  within the cerebral white matter and pons. Chronic small vessel  ischemic changes also present within the thalami.     Moderate cerebral atrophy.  Comparatively mild cerebellar atrophy.     Mild paranasal sinus mucosal thickening.  Swallow evaluation ordered  - due to pt coughing.      SLP Plan  Continue with current plan of care          Recommendations  Diet recommendations: Dysphagia 3 (mechanical soft);Thin liquid Liquids provided via: Cup;Straw Medication Administration: Whole meds with liquid Supervision: Patient able to self feed Compensations: Small sips/bites;Slow rate (leave suction within reach, provide lidocaine  BEFORE MEALS) Postural Changes and/or Swallow Maneuvers: Seated upright 90 degrees;Upright 30-60 min after meal  Oral care QID     Dysphagia, unspecified (R13.10)     Continue with current plan of care   Madelin POUR, MS [facility] SLP Acute Rehab Services Office [phone]   Nicolas Emmie Caldron  08/24/2023, 11:28 AM

## 2023-08-25 DIAGNOSIS — Z66 Do not resuscitate: Secondary | ICD-10-CM

## 2023-08-25 DIAGNOSIS — R339 Retention of urine, unspecified: Secondary | ICD-10-CM

## 2023-08-25 DIAGNOSIS — R627 Adult failure to thrive: Secondary | ICD-10-CM | POA: Diagnosis not present

## 2023-08-25 DIAGNOSIS — Z515 Encounter for palliative care: Secondary | ICD-10-CM

## 2023-08-25 DIAGNOSIS — R531 Weakness: Secondary | ICD-10-CM

## 2023-08-25 DIAGNOSIS — Z7189 Other specified counseling: Secondary | ICD-10-CM

## 2023-08-25 DIAGNOSIS — U071 COVID-19: Principal | ICD-10-CM

## 2023-08-25 LAB — CBC WITH DIFFERENTIAL/PLATELET
Abs Immature Granulocytes: 0.04 K/uL (ref 0.00–0.07)
Basophils Absolute: 0 K/uL (ref 0.0–0.1)
Basophils Relative: 0 %
Eosinophils Absolute: 0 K/uL (ref 0.0–0.5)
Eosinophils Relative: 0 %
HCT: 36.5 % — ABNORMAL LOW (ref 39.0–52.0)
Hemoglobin: 11 g/dL — ABNORMAL LOW (ref 13.0–17.0)
Immature Granulocytes: 1 %
Lymphocytes Relative: 18 %
Lymphs Abs: 0.7 K/uL (ref 0.7–4.0)
MCH: 26.3 pg (ref 26.0–34.0)
MCHC: 30.1 g/dL (ref 30.0–36.0)
MCV: 87.3 fL (ref 80.0–100.0)
Monocytes Absolute: 0.2 K/uL (ref 0.1–1.0)
Monocytes Relative: 4 %
Neutro Abs: 3.2 K/uL (ref 1.7–7.7)
Neutrophils Relative %: 77 %
Platelets: 200 K/uL (ref 150–400)
RBC: 4.18 MIL/uL — ABNORMAL LOW (ref 4.22–5.81)
RDW: 14.3 % (ref 11.5–15.5)
WBC: 4.1 K/uL (ref 4.0–10.5)
nRBC: 0 % (ref 0.0–0.2)

## 2023-08-25 LAB — COMPREHENSIVE METABOLIC PANEL WITH GFR
ALT: 48 U/L — ABNORMAL HIGH (ref 0–44)
AST: 57 U/L — ABNORMAL HIGH (ref 15–41)
Albumin: 2.8 g/dL — ABNORMAL LOW (ref 3.5–5.0)
Alkaline Phosphatase: 56 U/L (ref 38–126)
Anion gap: 11 (ref 5–15)
BUN: 29 mg/dL — ABNORMAL HIGH (ref 8–23)
CO2: 24 mmol/L (ref 22–32)
Calcium: 8.6 mg/dL — ABNORMAL LOW (ref 8.9–10.3)
Chloride: 105 mmol/L (ref 98–111)
Creatinine, Ser: 0.99 mg/dL (ref 0.61–1.24)
GFR, Estimated: >60 mL/min (ref >60)
Glucose, Bld: 169 mg/dL — ABNORMAL HIGH (ref 70–99)
Potassium: 4 mmol/L (ref 3.5–5.1)
Sodium: 140 mmol/L (ref 135–145)
Total Bilirubin: 0.4 mg/dL (ref 0.0–1.2)
Total Protein: 6.5 g/dL (ref 6.5–8.1)

## 2023-08-25 LAB — PROCALCITONIN: Procalcitonin: 0.56 ng/mL

## 2023-08-25 MED ORDER — PREDNISONE 20 MG PO TABS
40.0000 mg | ORAL_TABLET | Freq: Every day | ORAL | Status: DC
  Administered 2023-08-26: 40 mg via ORAL
  Filled 2023-08-25: qty 2

## 2023-08-25 NOTE — Progress Notes (Signed)
 SLP Cancellation Note  Patient Details Name: HOUA ACKERT Sr. MRN: [id number] DOB: December 03, 1935   Cancelled treatment:       Reason Eval/Treat Not Completed: Other (comment) (Patient now hospice)  will dc.     Madelin POUR, MS [facility] SLP Acute Rehab Services Office [phone]  Nicolas Emmie Caldron 08/25/2023, 3:19 PM

## 2023-08-25 NOTE — Progress Notes (Signed)
 [facility] Liaison Note  Received request from [facility] for hospice services at home after discharge. Spoke with patient's son, Urho Rio. to initiate education related to hospice philosophy, services and team approach to care. Son verbalized understanding of information given. Per discussion, the plan is for discharge home most likely after DME is delivered.   DME needs discussed. Patient has the following equipment in the home: wheelchair, shower seat. Family requests the following equipment for delivery: hospital bed, over bed table.  Please send signed and completed DNR home with patient/family. Please provide prescriptions at discharge as needed to ensure ongoing symptom management.  [facility] information and contact numbers given to [employer]. Please call with any concerns.  Thank you for the opportunity to participate in this patient's care.   Eleanor Nail, LPN [facility] Liaison [phone]

## 2023-08-25 NOTE — TOC Progression Note (Addendum)
 Transition of Care (TOC) - Progression Note    Patient Details  Name: KAMILO OCH Sr. MRN: [id number] Date of Birth: Jul 23, 1935  Transition of Care [facility]) CM/SW Contact  Tawni CHRISTELLA Eva, LCSW Phone Number: 08/25/2023, 1:44 PM  Clinical Narrative:     CSW spoke with pt's son and provided list of hospice agencies , he is requesting time to review. Care management to follow.    ADDEN 2:10pm CSW spoke with pt's son, he has chosen [facility] for home hospice. CSW sent referral to [facility] hospice liaison with [facility]. Care Management to follow.                 Expected Discharge Plan and Services In-house Referral: Clinical Social Work Discharge Planning Services: CM Consult Post Acute Care Choice: Skilled Nursing Facility Living arrangements for the past 2 months: Single Family Home                 DME Arranged: N/A DME Agency: NA       HH Arranged: NA HH Agency: NA         Social Drivers of Health (SDOH) Interventions SDOH Screenings   Food Insecurity: No Food Insecurity (08/20/2023)  Housing: Low Risk  (08/20/2023)  Transportation Needs: No Transportation Needs (08/20/2023)  Utilities: Not At Risk (08/20/2023)  Depression (PHQ2-9): Low Risk  (03/25/2021)  Social Connections: Unknown (08/20/2023)  Recent Concern: Social Connections - Socially Isolated (08/16/2023)  Tobacco Use: High Risk (08/20/2023)    Readmission Risk Interventions     No data to display

## 2023-08-25 NOTE — Progress Notes (Signed)
 OT Cancellation Note  Patient Details Name: Edgar ROSTEN Sr. MRN: [id number] DOB: December 07, 1935   Cancelled Treatment:    Reason Eval/Treat Not Completed: Patient declined, no reason specified  Jacques Karna Loose 08/25/2023, 2:25 PM

## 2023-08-25 NOTE — Progress Notes (Signed)
 PROGRESS NOTE  Edgar RUSSOM Sr.  DOB: 12-26-1935  PCP: [facility] FMW:[id number]  DOA: 08/20/2023  LOS: 5 days  Hospital Day: 6  Brief narrative: Edgar [name] is a 88 y.o. male with PMH significant for chronic urinary retention s/p chronic Foley, recurrent UTI, debility, anxiety, who has been hospitalized twice in the past 30 days due to generalized weakness and complicated UTI  8/10, patient presented to ED with complaint of generalized weakness. His son was helping him to go to the bathroom at home, the patient became very weak and slid to the floor.  Patient also reports myalgia, mild sore throat, rhinorrhea.  No fever. On the most recent admission the patient was offered to go to a skilled nursing facility for rehab, but he declined it and went home. In the ED, patient was afebrile, hemodynamically stable. Initial labs with CBC unremarkable, CMP unremarkable Respiratory virus panel with COVID PCR positive.    Subjective: Today, patient denies any new complaints.  Cough seems to have improved.  Overall poor prognosis.  Consulted palliative/hospice, patient and son agreeable for home hospice upon discharge.   Assessment and plan:  COVID-19 virus infection Presented with generalized weakness COVID PCR positive Repeat chest x-ray now with infiltrates, right-sided, possible aspiration Bilateral lower extremity Doppler ultrasound negative for DVT Switch IV Solu-Medrol  to prednisone  On Mucinex  twice daily, Tessalon  Perles as well   SIRS Possible aspiration pneumonia Noted increased secretions, s/p 1 dose of Robinul  on 8/14 On 8/11, patient had fever spikes Blood culture x 2, no growth till date, no urine culture obtained Repeat chest x-ray now with infiltrates, right-sided, possible aspiration Received 4 days of empiric IV Rocephin  given possibility of UTI associated with chronic Foley catheter, stopped Continue IV Unasyn  as repeat chest x-ray showed  above  Odynophagia Unknown etiology, possible noted oral thrush Continue Magic wash/lidocaine , started on nystatin  swish and swallow  Chronic urinary retention S/p chronic indwelling catheter. With history of recurrent UTIs.  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  Mild protein calorie malnutrition Dietitian consulted  Class I obesity  Body mass index is 32.13 kg/m.   Goals of care discussion Adult failure to thrive Patient with overall poor prognosis, repeat admissions, advanced age Palliative/hospice consulted, appreciate recs Plan for home hospice   Mobility:  PT Orders: Active   PT Follow up Rec: Skilled Nursing-Short Term Rehab (<3 Hours/Day)08/23/2023 1300   Goals of care   Code Status: Limited: Do not attempt resuscitation (DNR) -DNR-LIMITED -Do Not Intubate/DNI      DVT prophylaxis:  Lovenox  subcu   Antimicrobials: Unasyn  Fluid: None.  Encourage oral hydration Consultants: Palliative/hospice Family Communication: None at bedside  Status: Inpatient Level of care:  Progressive   Patient is from: Home Needs to continue in-hospital care: Level of care Anticipated d/c to: Home health with hospice    Diet:  Diet Order             Diet Heart Fluid consistency: Thin  Diet effective now                   Scheduled  Meds:  alum & mag hydroxide-simeth  30 mL Oral TID WC & HS   And   lidocaine   15 mL Oral TID WC & HS   benzonatate   100 mg Oral TID   Chlorhexidine  Gluconate Cloth  6 each Topical Daily   enoxaparin  (LOVENOX )  injection  50 mg Subcutaneous Q24H   guaiFENesin   600 mg Oral BID   melatonin  3 mg Oral QHS   methylPREDNISolone  (SOLU-MEDROL ) injection  81.25 mg Intravenous Daily   nystatin   5 mL Oral QID   senna-docusate  1 tablet Oral QHS    PRN meds: acetaminophen  **OR** acetaminophen , ondansetron  **OR** ondansetron  (ZOFRAN ) IV, polyethylene glycol   Infusions:   ampicillin -sulbactam (UNASYN ) IV 3 g (08/25/23 1414)    Antimicrobials: Anti-infectives (From admission, onward)    Start     Dose/Rate Route Frequency Ordered Stop   08/24/23 1300  Ampicillin -Sulbactam (UNASYN ) 3 g in sodium chloride  0.9 % 100 mL IVPB        3 g 200 mL/hr over 30 Minutes Intravenous Every 6 hours 08/24/23 1157     08/21/23 1500  cefTRIAXone  (ROCEPHIN ) 1 g in sodium chloride  0.9 % 100 mL IVPB  Status:  Discontinued        1 g 200 mL/hr over 30 Minutes Intravenous Every 24 hours 08/21/23 1429 08/24/23 1154       Objective: Vitals:   08/24/23 2232 08/25/23 0537  BP: (!) 113/52 (!) 105/56  Pulse: 74 60  Resp: 16 14  Temp: 99 F (37.2 C) 98.6 F (37 C)  SpO2: 92% 95%    Intake/Output Summary (Last 24 hours) at 08/25/2023 1731 Last data filed at 08/25/2023 1728 Gross per 24 hour  Intake 700 ml  Output 900 ml  Net -200 ml   Filed Weights   08/20/23 1455 08/21/23 2100  Weight: 104.3 kg 98.7 kg   Weight change:  Body mass index is 32.13 kg/m.   Physical Exam: General: NAD  Cardiovascular: S1, S2 present Respiratory: Diminished breath sounds bilaterally Abdomen: Soft, nontender, nondistended, bowel sounds present Musculoskeletal: No bilateral pedal edema noted, chronic right upper extremity weakness Skin: Normal Psychiatry: Normal mood   Data Review: I have personally reviewed the laboratory data and studies available.  F/u labs ordered Unresulted Labs (From admission, onward)     Start     Ordered   08/25/23 0500  Comprehensive metabolic panel with GFR  Daily,   R     Question:  Specimen  collection method  Answer:  Lab=Lab collect   08/24/23 2018   08/25/23 0500  CBC with Differential/Platelet  Daily,   R     Question:  Specimen collection method  Answer:  Lab=Lab collect   08/24/23 2018           Signed, Lebron JINNY Cage, MD [facility] 08/25/2023

## 2023-08-25 NOTE — Consult Note (Signed)
 Consultation Note Date: 08/25/2023   Patient Name: Edgar FRANZONI Sr.  DOB: 13-Mar-1935  MRN: [id number]  Age / Sex: 88 y.o., male   PCP: [employer] [place] Referring Physician: Donnamarie Lebron PARAS, MD  Reason for Consultation: Establishing goals of care     Chief Complaint/History of Present Illness:   Patient is an 88 year old male with a past medical history of chronic urinary retention status post chronic Foley, recurrent urinary tract infections, debility, and anxiety, who was admitted on 08/20/2023 after fall at home.  Patient notes he became very weak when son was helping him go to the bathroom and slid to the floor.  Patient has been admitted twice within the past 30 days due to generalized weakness and complicated urinary tract infections.  At time of last hospital discharge, patient refused to go to rehab.  During hospitalization, patient found to be positive for COVID-19.  Patient also receiving management for odynophagia likely adding to concerns for possible aspiration pneumonia.  Palliative medicine team consulted to assist with complex medical decision making.  Extensive review of EMR including recent documentation from hospitalist and SLP.  Reviewed recent CMP noting albumin 2.8.  Personally reviewed recent chest x-ray noting infiltrate developing on right side.  Presented to bedside to speak with patient.  No visitors present at bedside.  Patient lying comfortably in bed.  Introduced myself as a member of the palliative medicine team my role in patient's medical journey.  Will note that throughout conversation patient voiced frustration with being in the hospital and having to continuously answer questions when he just wants to be left alone.  Acknowledged this and noted wanted to assist with coordinating care plans moving forward based on what he wants for medical care.  We discussed patient's current medical illnesses and continued weakness.  Discussed how patient  would like to spend his time moving forward.  Patient discussed that he does not want to go to rehab.  Patient wants to be able to go home.  Acknowledged this.  With permission, introduced the philosophy of hospice and the generalities of what hospice would and would not provide.  Patient noted I want that.  Discussed that having hospice support at home would mean focusing on quality and comfort at the end of life wanting to avoid coming back to the hospital.  Patient acknowledged this.  Asked for permission to call his son to speak to him about this and patient asked this provider to please do so.  Patient specifically requested that I call his son, Latavion Halls.  Also asked permission to discuss CODE STATUS.  Patient spent time explaining full code versus DNR.  Patient noted that providers have asked in this before and he is tired of being asked about it.  Patient noted that if he croaks, he wants to be left that way.  Patient does not want to be put on any machines or be stuck in the hospital.  Acknowledged this.  Recommended change of CODE STATUS to DNR based on patient's wishes.  Patient agreeing with changing code to DNR status at this time.  Noted would put sign DNR in patient's chart as well to hopefully prevent further providers inquiring about his wishes regarding this since he is tired of being asked.  Patient voiced appreciation for this.  Also noted would discuss this with. All questions answered at that time.  Noted palliative medicine team continue to follow with patient's medical journey.  Able to call patient's [employer]. introduced  myself as a member of the palliative medicine team and my role in patient's medical journey.  Learned that patient is living with son who is helping to care for him.  Reviewed patient's current medical illnesses and plans for medical care moving forward.  Son acknowledges that his father does not want to go to rehab again.  We discussed hospice support at home and  what this would and would not entail.  Noted hospice philosophy of someone having 6 months or less and wanting to receive comfort focused care at home without having to return to the hospital.  Son acknowledged this.  Also discussed patient's wishes regarding CODE STATUS.  Son agreed with change of CODE STATUS to DNR/DNI.  He noted that his father has told him he does not want to be put on life support machines.  Noted would have TOC reach out to son to provide hospice choices in order to plan on patient getting home with hospice.  All questions answered at that time.  Noted palliative medicine team to continue to follow along with patient's medical journey.  Discussed care with hospitalist, TOC, and RN to coordinate care after visit.  Primary Diagnoses  Present on Admission:  Failure to thrive in adult  COVID-19 virus infection  Class 1 obesity  Urinary retention  Mild protein malnutrition (HCC)  Normocytic anemia   Past Medical History:  Diagnosis Date   Urinary retention    Social History   Socioeconomic History   Marital status: Legally Separated    Spouse name: Not on file   Number of children: Not on file   Years of education: Not on file   Highest education level: Not on file  Occupational History   Not on file  Tobacco Use   Smoking status: Every Day    Current packs/day: 1.00    Average packs/day: 1 pack/day for 60.0 years (60.0 ttl pk-yrs)    Types: Cigarettes   Smokeless tobacco: Never  Vaping Use   Vaping status: Never Used  Substance and Sexual Activity   Alcohol use: Not Currently   Drug use: No   Sexual activity: Not Currently  Other Topics Concern   Not on file  Social History Narrative   Not on file   Social Drivers of Health   Financial Resource Strain: Not on file  Food Insecurity: No Food Insecurity (08/20/2023)   Hunger Vital Sign    Worried About Running Out of Food in the Last Year: Never true    Ran Out of Food in the Last Year: Never true   Transportation Needs: No Transportation Needs (08/20/2023)   PRAPARE - Administrator, Civil Service (Medical): No    Lack of Transportation (Non-Medical): No  Physical Activity: Not on file  Stress: Not on file  Social Connections: Unknown (08/20/2023)   Social Connection and Isolation Panel    Frequency of Communication with Friends and Family: Three times a week    Frequency of Social Gatherings with Friends and Family: Patient declined    Attends Religious Services: Patient declined    Database administrator or Organizations: No    Attends Banker Meetings: Never    Marital Status: Patient declined  Recent Concern: Social Connections - Socially Isolated (08/16/2023)   Social Connection and Isolation Panel    Frequency of Communication with Friends and Family: More than three times a week    Frequency of Social Gatherings with Friends and Family: Never  Attends Religious Services: Never    Active Member of Clubs or Organizations: No    Attends Engineer, structural: Not on file    Marital Status: Separated   Family History  Family history unknown: Yes   Scheduled Meds:  alum & mag hydroxide-simeth  30 mL Oral TID WC & HS   And   lidocaine   15 mL Oral TID WC & HS   benzonatate   100 mg Oral TID   Chlorhexidine  Gluconate Cloth  6 each Topical Daily   enoxaparin  (LOVENOX ) injection  50 mg Subcutaneous Q24H   guaiFENesin   600 mg Oral BID   melatonin  3 mg Oral QHS   methylPREDNISolone  (SOLU-MEDROL ) injection  81.25 mg Intravenous Daily   nystatin   5 mL Oral QID   senna-docusate  1 tablet Oral QHS   Continuous Infusions:  ampicillin -sulbactam (UNASYN ) IV 3 g (08/25/23 0600)   PRN Meds:.acetaminophen  **OR** acetaminophen , ondansetron  **OR** ondansetron  (ZOFRAN ) IV, polyethylene glycol Allergies  Allergen Reactions   Citrus Anaphylaxis    Swelling of mouth, cannot swallow.    Shellfish Allergy Swelling    Swelling of tongue    CBC:     Component Value Date/Time   WBC 4.1 08/25/2023 0345   HGB 11.0 (L) 08/25/2023 0345   HCT 36.5 (L) 08/25/2023 0345   PLT 200 08/25/2023 0345   MCV 87.3 08/25/2023 0345   NEUTROABS 3.2 08/25/2023 0345   LYMPHSABS 0.7 08/25/2023 0345   MONOABS 0.2 08/25/2023 0345   EOSABS 0.0 08/25/2023 0345   BASOSABS 0.0 08/25/2023 0345   Comprehensive Metabolic Panel:    Component Value Date/Time   NA 140 08/25/2023 0345   K 4.0 08/25/2023 0345   CL 105 08/25/2023 0345   CO2 24 08/25/2023 0345   BUN 29 (H) 08/25/2023 0345   CREATININE 0.99 08/25/2023 0345   GLUCOSE 169 (H) 08/25/2023 0345   CALCIUM  8.6 (L) 08/25/2023 0345   AST 57 (H) 08/25/2023 0345   ALT 48 (H) 08/25/2023 0345   ALKPHOS 56 08/25/2023 0345   BILITOT 0.4 08/25/2023 0345   PROT 6.5 08/25/2023 0345   ALBUMIN 2.8 (L) 08/25/2023 0345    Physical Exam: Vital Signs: BP (!) 105/56 (BP Location: Left Arm)   Pulse 60   Temp 98.6 F (37 C) (Oral)   Resp 14   Ht 5' 9 (1.753 m)   Wt 98.7 kg   SpO2 95%   BMI 32.13 kg/m  SpO2: SpO2: 95 % O2 Device: O2 Device: Room Air O2 Flow Rate: O2 Flow Rate (L/min): 3 L/min Intake/output summary:  Intake/Output Summary (Last 24 hours) at 08/25/2023 9196 Last data filed at 08/25/2023 0541 Gross per 24 hour  Intake 821.34 ml  Output 900 ml  Net -78.66 ml   LBM: Last BM Date : 08/24/23 Baseline Weight: Weight: 104.3 kg Most recent weight: Weight: 98.7 kg  General: NAD, alert, frustrated, chronically ill-appearing  Cardiovascular: RRR Respiratory: no increased work of breathing noted, not in respiratory distress Neuro: A&Ox4, following commands easily Psych: Frustrated          Palliative Performance Scale: 30%              Additional Data Reviewed: Recent Labs    08/23/23 0936 08/25/23 0345  WBC 6.4 4.1  HGB 12.6* 11.0*  PLT 154 200  NA 139 140  BUN 27* 29*  CREATININE 1.12 0.99    Imaging: VAS US  LOWER EXTREMITY VENOUS (DVT)  Lower Venous DVT Study  Patient  Name:  Flora C Wisner Sr.  Date of Exam:   08/24/2023 Medical Rec #: [id number]           Accession #:    [id number] Date of Birth: [birth year]           Patient Gender: M Patient Age:   5 years Exam Location:  [facility] Procedure:      VAS US  LOWER EXTREMITY VENOUS (DVT) Referring Phys: NKEIRUKA EZENDUKA  --------------------------------------------------------------------------------   Indications: Edema.   Risk Factors: COVID 19 positive None identified. Limitations: Poor ultrasound/tissue interface and patient positioning, patient immobility. Comparison Study: No prior studies.  Performing Technologist: Cordella Collet RVT    Examination Guidelines: A complete evaluation includes B-mode imaging, spectral Doppler, color Doppler, and power Doppler as needed of all accessible portions of each vessel. Bilateral testing is considered an integral part of a complete examination. Limited examinations for reoccurring indications may be performed as noted. The reflux portion of the exam is performed with the patient in reverse Trendelenburg.     +---------+---------------+---------+-----------+----------+-------------------+ RIGHT    CompressibilityPhasicitySpontaneityPropertiesThrombus Aging      +---------+---------------+---------+-----------+----------+-------------------+ CFV      Full           Yes      Yes                                      +---------+---------------+---------+-----------+----------+-------------------+ SFJ      Full                                                             +---------+---------------+---------+-----------+----------+-------------------+ FV Prox  Full                                                             +---------+---------------+---------+-----------+----------+-------------------+ FV Mid   Full                                                              +---------+---------------+---------+-----------+----------+-------------------+ FV DistalFull                                                             +---------+---------------+---------+-----------+----------+-------------------+ PFV      Full                                                             +---------+---------------+---------+-----------+----------+-------------------+ POP      Full  Yes      Yes                                      +---------+---------------+---------+-----------+----------+-------------------+ PTV      Full                                                             +---------+---------------+---------+-----------+----------+-------------------+ PERO                                                  Not well visualized +---------+---------------+---------+-----------+----------+-------------------+        +---------+---------------+---------+-----------+----------+-------------------+ LEFT     CompressibilityPhasicitySpontaneityPropertiesThrombus Aging      +---------+---------------+---------+-----------+----------+-------------------+ CFV      Full           Yes      Yes                                      +---------+---------------+---------+-----------+----------+-------------------+ SFJ      Full                                                             +---------+---------------+---------+-----------+----------+-------------------+ FV Prox  Full                                                             +---------+---------------+---------+-----------+----------+-------------------+ FV Mid   Full                                                             +---------+---------------+---------+-----------+----------+-------------------+ FV Distal               Yes      Yes                                       +---------+---------------+---------+-----------+----------+-------------------+ PFV      Full                                                             +---------+---------------+---------+-----------+----------+-------------------+ POP  Yes      Yes                                      +---------+---------------+---------+-----------+----------+-------------------+ PTV                                                   Not well visualized +---------+---------------+---------+-----------+----------+-------------------+ PERO                                                  Not well visualized +---------+---------------+---------+-----------+----------+-------------------+             Summary: RIGHT:  - There is no evidence of deep vein thrombosis in the lower extremity. However, portions of this examination were limited- see technologist comments above.   - No cystic structure found in the popliteal fossa.   LEFT:  - There is no evidence of deep vein thrombosis in the lower extremity. However, portions of this examination were limited- see technologist comments above.   - No cystic structure found in the popliteal fossa.    *See table(s) above for measurements and observations.  Electronically signed by Gaile New MD on 08/24/2023 at 7:11:11 PM.      Final   DG CHEST PORT 1 VIEW CLINICAL DATA:  Cough.  EXAM: PORTABLE CHEST 1 VIEW  COMPARISON:  August 20, 2023.  FINDINGS: The heart size and mediastinal contours are within normal limits. Left lung is clear. Interval development of minimal right basilar opacity which potentially may represent difference in radiographic technique, but the possibility of some degree of atelectasis or early infiltrate cannot be excluded. The visualized skeletal structures are unremarkable.  IMPRESSION: Interval development of minimal right basilar opacity as noted above with the  possibility of some degree of atelectasis or early infiltrate. Follow-up radiographs recommended.  Electronically Signed   By: Lynwood Landy Raddle M.D.   On: 08/24/2023 10:59    I personally reviewed recent imaging.   Palliative Care Assessment and Plan Summary of Established Goals of Care and Medical Treatment Preferences   Patient is an 88 year old male with a past medical history of chronic urinary retention status post chronic Foley, recurrent urinary tract infections, debility, and anxiety, who was admitted on 08/20/2023 after fall at home.  Patient notes he became very weak when son was helping him go to the bathroom and slid to the floor.  Patient has been admitted twice within the past 30 days due to generalized weakness and complicated urinary tract infections.  At time of last hospital discharge, patient refused to go to rehab.  During hospitalization, patient found to be positive for COVID-19.  Patient also receiving management for odynophagia likely adding to concerns for possible aspiration pneumonia.  Palliative medicine team consulted to assist with complex medical decision making.  # Complex medical decision making/goals of care  - Discussed care with patient at bedside as detailed above in HPI.  Also discussed care with patient's son, Nasser Ku, over the phone as detailed above in HPI.  Discussed pathways for medical care moving forward.  Patient incredibly frustrated with having to return to the hospital.  Patient notes  he no longer wants to do this or go to rehab.  Patient wants to be able to return home with his son.  Discussed hospice philosophy and what would and would not be provided in the home setting.  Patient and son agreeing with home hospice referral at this time.  Palliative medicine team continuing to assist with conversations moving forward as appropriate and able.  -  Code Status: Limited: Do not attempt resuscitation (DNR) -DNR-LIMITED -Do Not Intubate/DNI       -  Discussed CODE STATUS with patient and updated son as detailed above in HPI.  Patient noted that he would not want to be on life support and would want to be allowed to die aturally.  Son agreed with this and noted patient had expressed these same wishes to him previously.  Noted would appropriately change CODE STATUS to DNR/DNI.  # Psycho-social/Spiritual Support:  - Support System: Sons- Lander Junior, Secretary/administrator  # Discharge Planning:  Home with Hospice  Thank you for allowing the palliative care team to participate in the care Woods C Cahalan MckaySABRA Tinnie Radar, DO Palliative Care Provider PMT # [phone]  If patient remains symptomatic despite maximum doses, please call PMT at [phone] between 0700 and 1900. Outside of these hours, please call attending, as PMT does not have night coverage.  Billing based on MDM: High  Problems Addressed: One or more chronic illnesses with severe exacerbation, progression, or side effects of treatment.  Amount and/or Complexity of Data: Category 1:Review of prior external note(s) from each unique source, Review of the result(s) of each unique test, and Assessment requiring an independent historian(s), Category 2:Independent interpretation of a test performed by another physician/other qualified health care professional (not separately reported), and Category 3:Discussion of management or test interpretation with external physician/other qualified health care professional/appropriate source (not separately reported)  Risks: Decision not to resuscitate or to de-escalate care because of poor prognosis

## 2023-08-26 ENCOUNTER — Other Ambulatory Visit (HOSPITAL_COMMUNITY): Payer: Self-pay

## 2023-08-26 DIAGNOSIS — R627 Adult failure to thrive: Secondary | ICD-10-CM | POA: Diagnosis not present

## 2023-08-26 LAB — CULTURE, BLOOD (ROUTINE X 2)
Culture: NO GROWTH
Culture: NO GROWTH
Special Requests: ADEQUATE
Special Requests: ADEQUATE

## 2023-08-26 MED ORDER — BENZONATATE 100 MG PO CAPS
100.0000 mg | ORAL_CAPSULE | Freq: Three times a day (TID) | ORAL | 0 refills | Status: DC
  Filled 2023-08-26: qty 20, 7d supply, fill #0

## 2023-08-26 MED ORDER — NYSTATIN 100000 UNIT/ML MT SUSP
5.0000 mL | Freq: Three times a day (TID) | OROMUCOSAL | 0 refills | Status: DC
  Filled 2023-08-26: qty 450, 30d supply, fill #0

## 2023-08-26 MED ORDER — NYSTATIN 100000 UNIT/ML MT SUSP
5.0000 mL | Freq: Four times a day (QID) | OROMUCOSAL | 0 refills | Status: DC
  Filled 2023-08-26: qty 60, 3d supply, fill #0

## 2023-08-26 MED ORDER — PREDNISONE 20 MG PO TABS
40.0000 mg | ORAL_TABLET | Freq: Every day | ORAL | 0 refills | Status: AC
  Filled 2023-08-26: qty 4, 2d supply, fill #0

## 2023-08-26 MED ORDER — AMOXICILLIN-POT CLAVULANATE 875-125 MG PO TABS
1.0000 | ORAL_TABLET | Freq: Two times a day (BID) | ORAL | 0 refills | Status: AC
Start: 2023-08-26 — End: 2023-08-30
  Filled 2023-08-26: qty 8, 4d supply, fill #0

## 2023-08-26 NOTE — TOC Transition Note (Signed)
 Transition of Care [facility]) - Discharge Note   Patient Details  Name: Edgar MCANDREW Sr. MRN: [id number] Date of Birth: Mar 01, 1935  [facility] CM/SW Contact:  Sheri ONEIDA Sharps, LCSW Phone Number: 08/26/2023, 2:58 PM   Clinical Narrative:    Pt ready to dc home w/ hospice. Transport called at 2:50pm. DC packet left at nurses station. No further TOC needs.   Final next level of care: Home w Hospice Care Barriers to Discharge: Barriers Resolved   Patient Goals and CMS Choice Patient states their goals for this hospitalization and ongoing recovery are:: retrun home CMS Medicare.gov Compare Post Acute Care list provided to:: Patient (NA) Choice offered to / list presented to : NA [place] ownership interest in [facility].provided to:: Parent NA    Discharge Placement                Patient to be transferred to facility by: [facility] Name of family member notified: Damion, Kant (Son)  [phone] [facility]) Patient and family notified of of transfer: 08/26/23  Discharge Plan and Services Additional resources added to the After Visit Summary for   In-house Referral: Clinical Social Work Discharge Planning Services: CM Consult Post Acute Care Choice: Skilled Nursing Facility          DME Arranged: N/A DME Agency: NA       HH Arranged: NA HH Agency: NA        Social Drivers of Health (SDOH) Interventions SDOH Screenings   Food Insecurity: No Food Insecurity (08/20/2023)  Housing: Low Risk  (08/20/2023)  Transportation Needs: No Transportation Needs (08/20/2023)  Utilities: Not At Risk (08/20/2023)  Depression (PHQ2-9): Low Risk  (03/25/2021)  Social Connections: Unknown (08/20/2023)  Recent Concern: Social Connections - Socially Isolated (08/16/2023)  Tobacco Use: High Risk (08/20/2023)     Readmission Risk Interventions    08/26/2023    2:56 PM  Readmission Risk Prevention Plan  Transportation Screening Complete  PCP or Specialist Appt within  5-7 Days Complete  Home Care Screening Complete  Medication Review (RN CM) Complete

## 2023-08-26 NOTE — Discharge Summary (Signed)
 Physician Discharge Summary   Patient: Edgar GREENER Sr. MRN: [id number] DOB: [birth year]  Admit date:     08/20/2023  Discharge date: 08/26/23  Discharge Physician: Lebron JINNY Cage   PCP: [facility]   Recommendations at discharge:   Follow-up with home with hospice  Discharge Diagnoses: Principal Problem:   Failure to thrive in adult Active Problems:   Urinary retention   Generalized weakness   COVID-19 virus infection   Class 1 obesity   Mild protein malnutrition (HCC)   Normocytic anemia   Palliative care encounter   Goals of care, counseling/discussion   Counseling and coordination of care   DNR (do not resuscitate)   Hospital Course: Edgar LAMARQUE Sr. is a 88 y.o. male with PMH significant for chronic urinary retention s/p chronic Foley, recurrent UTI, debility, anxiety, who has been hospitalized twice in the past 30 days due to generalized weakness and complicated UTI 8/10, patient presented to ED with complaint of generalized weakness. His son was helping him to go to the bathroom at home, the patient became very weak and slid to the floor.  Patient also reports myalgia, mild sore throat, rhinorrhea.  No fever. On the most recent admission the patient was offered to go to a skilled nursing facility for rehab, but he declined it and went home. In the ED, patient was afebrile, hemodynamically stable. Initial labs with CBC unremarkable, CMP unremarkable. Respiratory virus panel with COVID PCR positive.   Today, patient resting comfortably in bed.  Remained on room air, no increased work of breathing noted.  Coughing seems to be improved.  Denies any chest pain, worsening shortness of breath, abdominal pain, nausea/vomiting, fever/chills.    Assessment and Plan:  COVID-19 virus infection Presented with generalized weakness COVID PCR positive Repeat chest x-ray now with infiltrates, right-sided, possible aspiration Bilateral lower extremity  Doppler ultrasound negative for DVT Continue prednisone  to complete 5 days On Mucinex  twice daily, Tessalon  Perles as well   SIRS Possible aspiration pneumonia On 8/11, patient had fever spikes Noted increased secretions, s/p 1 dose of Robinul  on 8/14 Blood culture x 2, no growth till date, no urine culture obtained Repeat chest x-ray now with infiltrates, right-sided, possible aspiration Received 4 days of empiric IV Rocephin  given possibility of UTI associated with chronic Foley catheter, stopped S/p IV Unasyn , switch to p.o. Augmentin  to complete 7 days of treatment   Odynophagia Unknown etiology, possible noted oral thrush Continue Magic mouthwash, nystatin  swish and swallow   Chronic urinary retention S/p chronic indwelling catheter. With history of recurrent UTIs.   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    Goals of care discussion Adult failure to thrive Patient with overall poor prognosis, repeat admissions, advanced age Palliative/hospice consulted, appreciate recs Plan for home hospice       Consultants: Hospice/palliative Procedures performed: None Disposition: Hospice care Diet recommendation:  Regular diet   DISCHARGE MEDICATION: Allergies as of 08/26/2023  Reactions   Citrus Anaphylaxis   Swelling of mouth, cannot swallow.    Shellfish Allergy Swelling   Swelling of tongue          Medication List     STOP taking these medications    cyanocobalamin  1000 MCG tablet   MULTIVITAMIN ADULT PO       TAKE these medications    acetaminophen  500 MG tablet Commonly known as: TYLENOL  Take 2 tablets (1,000 mg total) by mouth every 6 (six) hours as needed for mild pain (pain score 1-3).   ALLERGY RELIEF PO Take 1 tablet by mouth daily.   amoxicillin -clavulanate 875-125 MG tablet Commonly known as: AUGMENTIN  Take 1 tablet by mouth 2 (two) times daily for 4 days.   benzonatate  100 MG capsule Commonly known as: TESSALON  Take 1 capsule (100 mg total) by mouth 3 (three) times daily.   ibuprofen  200 MG tablet Commonly known as: ADVIL  Take 1 tablet (200 mg total) by mouth every 6 (six) hours as needed for fever, headache, mild pain (pain score 1-3) or cramping. What changed:  how much to take when to take this reasons to take this   magic mouthwash Soln Take 5 mLs by mouth 3 (three) times daily. Suspension contains equal amounts of Maalox Extra Strength, nystatin , and diphenhydramine.   MUCUS RELIEF PO Take 2 tablets by mouth in the morning.   nystatin  100000 UNIT/ML suspension Commonly known as: MYCOSTATIN  Take 5 mLs (500,000 Units total) by mouth 4 (four) times daily.   polyethylene glycol 17 g packet Commonly known as: MIRALAX  / GLYCOLAX  Take 17 g by mouth daily as needed for mild constipation.   predniSONE  20 MG tablet Commonly known as: DELTASONE  Take 2 tablets (40 mg total) by mouth daily with breakfast for 2 days. Start taking on: August 27, 2023        Discharge Exam: Edgar Mckay Weights   08/20/23 1455 08/21/23 2100  Weight: 104.3 kg 98.7 kg   General: NAD, chronically ill-appearing Cardiovascular: S1, S2 present Respiratory: Diminished breath sounds bilaterally Abdomen: Soft, nontender, nondistended, bowel sounds present Musculoskeletal: bilateral pedal edema noted Skin: Normal Psychiatry: Normal mood   Condition at  discharge: Stable  The results of significant diagnostics from this hospitalization (including imaging, microbiology, ancillary and laboratory) are listed below for reference.   Imaging Studies: VAS US  LOWER EXTREMITY VENOUS (DVT) Result Date: 08/24/2023  Lower Venous DVT Study Patient Name:  LEMARIO CHAIKIN Sr.  Date of Exam:   08/24/2023 Medical Rec #: [id number]           Accession #:    [id number] Date of Birth: 1935/06/13           Patient Gender: M Patient Age:   88 years Exam Location:  [facility] Procedure:      VAS US  LOWER EXTREMITY VENOUS (DVT) Referring Phys: Zackarie Chason --------------------------------------------------------------------------------  Indications: Edema.  Risk Factors: COVID 19 positive None identified. Limitations: Poor ultrasound/tissue interface and patient positioning, patient immobility. Comparison Study: No prior studies. Performing Technologist: Cordella Collet RVT  Examination Guidelines: A complete evaluation includes B-mode imaging, spectral Doppler, color Doppler, and power Doppler as needed of all accessible portions of each vessel. Bilateral testing is considered an integral part of a complete examination. Limited examinations for reoccurring indications may be performed as noted. The reflux portion of the exam is performed with the patient in reverse Trendelenburg.  +---------+---------------+---------+-----------+----------+-------------------+ RIGHT    CompressibilityPhasicitySpontaneityPropertiesThrombus Aging      +---------+---------------+---------+-----------+----------+-------------------+ CFV  Full           Yes      Yes                                      +---------+---------------+---------+-----------+----------+-------------------+ SFJ      Full                                                             +---------+---------------+---------+-----------+----------+-------------------+ FV Prox  Full                                                              +---------+---------------+---------+-----------+----------+-------------------+ FV Mid   Full                                                             +---------+---------------+---------+-----------+----------+-------------------+ FV DistalFull                                                             +---------+---------------+---------+-----------+----------+-------------------+ PFV      Full                                                             +---------+---------------+---------+-----------+----------+-------------------+ POP      Full           Yes      Yes                                      +---------+---------------+---------+-----------+----------+-------------------+ PTV      Full                                                             +---------+---------------+---------+-----------+----------+-------------------+ PERO                                                  Not well visualized +---------+---------------+---------+-----------+----------+-------------------+   +---------+---------------+---------+-----------+----------+-------------------+ LEFT     CompressibilityPhasicitySpontaneityPropertiesThrombus Aging      +---------+---------------+---------+-----------+----------+-------------------+ CFV      Full  Yes      Yes                                      +---------+---------------+---------+-----------+----------+-------------------+ SFJ      Full                                                             +---------+---------------+---------+-----------+----------+-------------------+ FV Prox  Full                                                             +---------+---------------+---------+-----------+----------+-------------------+ FV Mid   Full                                                              +---------+---------------+---------+-----------+----------+-------------------+ FV Distal               Yes      Yes                                      +---------+---------------+---------+-----------+----------+-------------------+ PFV      Full                                                             +---------+---------------+---------+-----------+----------+-------------------+ POP                     Yes      Yes                                      +---------+---------------+---------+-----------+----------+-------------------+ PTV                                                   Not well visualized +---------+---------------+---------+-----------+----------+-------------------+ PERO                                                  Not well visualized +---------+---------------+---------+-----------+----------+-------------------+     Summary: RIGHT: - There is no evidence of deep vein thrombosis in the lower extremity. However, portions of this examination were limited- see technologist comments above.  - No cystic structure found in the popliteal fossa.  LEFT: - There is no evidence of deep vein thrombosis  in the lower extremity. However, portions of this examination were limited- see technologist comments above.  - No cystic structure found in the popliteal fossa.  *See table(s) above for measurements and observations. Electronically signed by Gaile New MD on 08/24/2023 at 7:11:11 PM.    Final    DG CHEST PORT 1 VIEW Result Date: 08/24/2023 CLINICAL DATA:  Cough. EXAM: PORTABLE CHEST 1 VIEW COMPARISON:  August 20, 2023. FINDINGS: The heart size and mediastinal contours are within normal limits. Left lung is clear. Interval development of minimal right basilar opacity which potentially may represent difference in radiographic technique, but the possibility of some degree of atelectasis or early infiltrate cannot be excluded. The visualized skeletal structures  are unremarkable. IMPRESSION: Interval development of minimal right basilar opacity as noted above with the possibility of some degree of atelectasis or early infiltrate. Follow-up radiographs recommended. Electronically Signed   By: Lynwood Landy Raddle M.D.   On: 08/24/2023 10:59   DG Chest Portable 1 View Result Date: 08/20/2023 EXAM: 1 VIEW XRAY OF THE CHEST 08/20/2023 10:52:00 AM COMPARISON: 1 view chest x-ray 11/30/2020 and CT angio of chest 07/21/2023. CLINICAL HISTORY: Patient with recent admission for weakness, now presenting with increased weakness and rhonchi on the right. EMS called for assistance after patient became too weak and slid to the floor. Son reports helping patient to the bathroom prior to the incident. FINDINGS: LUNGS AND PLEURA: No focal pulmonary opacity. No pulmonary edema. No pleural effusion. No pneumothorax. HEART AND MEDIASTINUM: Heart size is upper limits of normal. No acute abnormality of the cardiac and mediastinal silhouettes. BONES AND SOFT TISSUES: No acute osseous abnormality. IMPRESSION: 1. No acute findings. Electronically signed by: Lonni Necessary MD 08/20/2023 11:12 AM EDT RP Workstation: [id number]   CT ABDOMEN PELVIS W CONTRAST Result Date: 08/15/2023 CLINICAL DATA:  Acute abdominal pain.  Leaking catheter. EXAM: CT ABDOMEN AND PELVIS WITH CONTRAST TECHNIQUE: Multidetector CT imaging of the abdomen and pelvis was performed using the standard protocol following bolus administration of intravenous contrast. RADIATION DOSE REDUCTION: This exam was performed according to the departmental dose-optimization program which includes automated exposure control, adjustment of the mA and/or kV according to patient size and/or use of iterative reconstruction technique. CONTRAST:  OMNIPAQUE  IOHEXOL  300 MG/ML  SOLN COMPARISON:  CT 07/21/2023 FINDINGS: Lower chest: Lower lobe bronchial thickening without confluent airspace disease. Dependent atelectasis. Hepatobiliary: No  focal hepatic abnormality. Layering gallstones in the gallbladder. No pericholecystic inflammation. No biliary dilatation or choledocholithiasis. Pancreas: No ductal dilatation or inflammation. Spleen: Normal in size without focal abnormality. Adrenals/Urinary Tract: No adrenal nodule. No hydronephrosis. No evidence of renal inflammation. No renal calculi. Decompressed ureters. Foley catheter decompresses urinary bladder. Circumferential bladder wall thickening, with mild perivesicular fat stranding bladder. Stomach/Bowel: Small hiatal hernia. Stomach is nondistended. No small bowel obstruction or inflammation. Moderate colonic stool burden. No colonic inflammatory change. Occasional colonic diverticula without diverticulitis. Vascular/Lymphatic: Aortic and branch atherosclerosis. No acute vascular findings. No abdominopelvic adenopathy. Reproductive: Marked prostatic enlargement causing mass effect on the bladder base. Other: No ascites or free air. Small fat containing left inguinal hernia. Musculoskeletal: There are no acute or suspicious osseous abnormalities. Degenerative change throughout the spine. IMPRESSION: 1. Foley catheter decompresses urinary bladder. Circumferential bladder wall thickening with mild perivesicular fat stranding, can be seen with chronic bladder outlet obstruction or cystitis. 2. Marked prostatic enlargement causing mass effect on the bladder base. 3. Cholelithiasis without CT findings of acute cholecystitis. 4. Small hiatal hernia. Aortic Atherosclerosis (ICD10-I70.0). Electronically Signed  By: Andrea Gasman M.D.   On: 08/15/2023 21:20   MR Lumbar Spine W Wo Contrast Result Date: 08/15/2023 EXAM: MR Lumbar Spine with and without intravenous contrast. 08/15/2023 06:40:27 PM TECHNIQUE: Multiplanar multisequence MRI of the lumbar spine was performed with and without the administration of intravenous contrast. COMPARISON: None available CLINICAL HISTORY: Low back pain, cauda equina  syndrome suspected. Pain abd, back, right shoulder, left leg for several weeks now. Has had reduced gait over 3 weeks. FINDINGS: BONES AND ALIGNMENT: Lumbar lordosis is maintained. No listhesis. No bone marrow edema or evidence of fracture. Vertebral body heights are maintained. No suspicious osseous lesion or abnormal enhancement. SPINAL CORD: The conus medullaris terminates at the L1-2 level. SOFT TISSUES: The paraspinal soft tissues are unremarkable. L1-L2: There is mild spinal canal stenosis and thickening of the ligaments and plenum without significant spinal canal or foraminal stenosis. L2-L3: There is a diffuse disc bulge resulting in lateral recess narrowing. Moderate facet arthrosis, thickening of the ligaments and plenum, and dorsal epidural lipomatosis with moderate spinal canal stenosis. There is mild bilateral foraminal stenosis. L3-L4: There is a diffuse disc bulge resulting in lateral recess narrowing, slightly greater on the right. Moderate facet arthrosis, thickening of the ligamentum flavum, and epidural lipomatosis resulting in mild spinal canal stenosis. There is mild foraminal stenosis on the right. L4-L5: There is diffuse disc bulge and mild disc height loss with lateral recess narrowing. Moderate facet arthrosis and thickening of the ligamentum flavum along with epidural lipomatosis resulting in moderate to severe spinal canal stenosis. There is moderate-to-severe right and moderate left foraminal stenosis. L5-S1: There is a diffuse disc bulge with lateral recess narrowing. Moderate facet arthrosis and epidural lipomatosis. Mild spinal canal stenosis. There is severe left and mild right foraminal stenosis. IMPRESSION: 1. 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 There is moderate-to-severe spinal canal stenosis at L4-5 and moderate spinal canal stenosis at L2-3. 2. Multilevel foraminal stenosis,  greatest and severe on the left at L5-S1. Additional moderate-to-severe right and moderate left foraminal stenosis at L4-5. Electronically signed by: Donnice Mania MD 08/15/2023 08:29 PM EDT RP Workstation: [id number]    Microbiology: Results for orders placed or performed during the hospital encounter of 08/20/23  Resp panel by RT-PCR (RSV, Flu A&B, Covid) Anterior Nasal Swab     Status: Abnormal   Collection Time: 08/20/23 10:31 AM   Specimen: Anterior Nasal Swab  Result Value Ref Range Status   SARS Coronavirus 2 by RT PCR POSITIVE (A) NEGATIVE Final    Comment: (NOTE) SARS-CoV-2 target nucleic acids are DETECTED.  The SARS-CoV-2 RNA is generally detectable in upper respiratory specimens during the acute phase of infection. Positive results are indicative of the presence of the identified virus, but do not rule out bacterial infection or co-infection with other pathogens not detected by the test. Clinical correlation with patient history and other diagnostic information is necessary to determine patient infection status. The expected result is Negative.  Fact Sheet for Patients: BloggerCourse.com  Fact Sheet for Healthcare Providers: SeriousBroker.it  This test is not yet approved or cleared by the [place]  FDA and  has been authorized for detection and/or diagnosis of SARS-CoV-2 by FDA under an Emergency Use Authorization (EUA).  This EUA will remain in effect (meaning this test can be used) for the duration of  the COVID-19 declaration under Section 564(b)(1) of the A ct, 21 U.S.C. section 360bbb-3(b)(1), unless the authorization is terminated  or revoked sooner.     Influenza A by PCR NEGATIVE NEGATIVE Final   Influenza B by PCR NEGATIVE NEGATIVE Final    Comment: (NOTE) The Xpert Xpress SARS-CoV-2/FLU/RSV plus assay is intended as an aid in the diagnosis of influenza from Nasopharyngeal swab specimens and should not  be used as a sole basis for treatment. Nasal washings and aspirates are unacceptable for Xpert Xpress SARS-CoV-2/FLU/RSV testing.  Fact Sheet for Patients: BloggerCourse.com  Fact Sheet for Healthcare Providers: SeriousBroker.it  This test is not yet approved or cleared by the [place]  FDA and has been authorized for detection and/or diagnosis of SARS-CoV-2 by FDA under an Emergency Use Authorization (EUA). This EUA will remain in effect (meaning this test can be used) for the duration of the COVID-19 declaration under Section 564(b)(1) of the Act, 21 U.S.C. section 360bbb-3(b)(1), unless the authorization is terminated or revoked.     Resp Syncytial Virus by PCR NEGATIVE NEGATIVE Final    Comment: (NOTE) Fact Sheet for Patients: BloggerCourse.com  Fact Sheet for Healthcare Providers: SeriousBroker.it  This test is not yet approved or cleared by the [place]  FDA and has been authorized for detection and/or diagnosis of SARS-CoV-2 by FDA under an Emergency Use Authorization (EUA). This EUA will remain in effect (meaning this test can be used) for the duration of the COVID-19 declaration under Section 564(b)(1) of the Act, 21 U.S.C. section 360bbb-3(b)(1), unless the authorization is terminated or revoked.  Performed at [facility] [address], [place], [place] [address]   Culture, blood (Routine X 2) w Reflex to ID Panel     Status: None   Collection Time: 08/21/23  1:06 PM   Specimen: BLOOD LEFT ARM  Result Value Ref Range Status   Specimen Description   Final    BLOOD LEFT ARM Performed at [facility], [address], [place], [place] [address]    Special Requests   Final    BOTTLES DRAWN AEROBIC AND ANAEROBIC Blood Culture adequate volume Performed at [facility], [address], [place], [place] [address]     Culture   Final    NO GROWTH 5 DAYS Performed at [facility], [address], [place], [place] [address]    Report Status 08/26/2023 FINAL  Final  Culture, blood (Routine X 2) w Reflex to ID Panel     Status: None   Collection Time: 08/21/23  1:22 PM   Specimen: BLOOD RIGHT HAND  Result Value Ref Range Status   Specimen Description   Final    BLOOD RIGHT HAND Performed at [facility], [address], [place], [place] [address]    Special Requests   Final    BOTTLES DRAWN AEROBIC AND ANAEROBIC Blood Culture adequate volume Performed at [facility], [address] [place], [place] [address]    Culture   Final    NO GROWTH 5 DAYS Performed at [facility], [address], [place], [place] [address]    Report Status 08/26/2023 FINAL  Final    Labs: CBC: Recent Labs  Lab 08/20/23 0915 08/21/23 0420 08/22/23 0404 08/23/23 0936 08/25/23 0345  WBC 8.0 7.1 10.9* 6.4 4.1  NEUTROABS 5.6  --  7.9* 4.3 3.2  HGB 12.2* 12.2* 11.9* 12.6* 11.0*  HCT 39.4 39.7 38.4* 41.5 36.5*  MCV 86.8 88.4 86.3 88.9 87.3  PLT 188 152 158 154 200   Basic Metabolic Panel: Recent Labs  Lab 08/20/23 0915 08/21/23 0420 08/22/23 0404 08/23/23 0936 08/25/23 0345  NA 140 141 139 139 140  K 3.9 4.3 3.7 3.6 4.0  CL 103 107 103 105 105  CO2 25 23 24 23 24   GLUCOSE 129* 116* 138* 135* 169*  BUN 13 17 20  27* 29*  CREATININE 0.92 1.04 1.07 1.12 0.99  CALCIUM  8.9 8.7* 8.5* 8.4* 8.6*   Liver Function Tests: Recent Labs  Lab 08/20/23 0915 08/21/23 0420 08/25/23 0345  AST 36 38 57*  ALT 34 34 48*  ALKPHOS 48 47 56  BILITOT 0.6 0.5 0.4  PROT 7.2 6.6 6.5  ALBUMIN 3.3* 3.1* 2.8*   CBG: No results for input(s): GLUCAP in the last 168 hours.  Discharge time spent: greater than 30 minutes.  Signed: Lebron JINNY Cage, MD [facility] 08/26/2023

## 2023-08-26 NOTE — Progress Notes (Signed)
 Discharge meds in a secure bag delivered to pt in room - placed in pt belonging bag at the bedside per primary RN request. Pt waiting on PTAR

## 2023-08-26 NOTE — Progress Notes (Signed)
  Daily Progress Note   Patient Name: Edgar PETERKA Sr.       Date: 08/26/2023 DOB: September 06, 1935  Age: 88 y.o. MRN#: [id number] Attending Physician: Donnamarie Lebron PARAS, MD Primary Care Physician: [facility], [place] Admit Date: 08/20/2023 Length of Stay: 6 days  Reason for Consultation/Follow-up: Establishing goals of care  Subjective:   Patient lying comfortably in bed.  Remains on COVID precautions.  Reviewed recent documentation from hospitalist and [facility] home hospice liaison.  Working to get equipment requested by family at home for patient.  Hospitalist noted today patient is medically stable for discharge. Placed signed DNR on patient's paper chart and will have scanned into EMR.  Discussed care with hospitalist, [facility] liaison, TOC, and RN to coordinate care. Objective:   Vital Signs:  BP 125/61 (BP Location: Right Arm)   Pulse (!) 58   Temp (!) 97.5 F (36.4 C) (Oral)   Resp 16   Ht 5' 9 (1.753 m)   Wt 98.7 kg   SpO2 97%   BMI 32.13 kg/m   Physical Exam: General: Lying comfortably in bed, NAD Cardiovascular: Bradycardia Respiratory: no increased work of breathing noted, not in respiratory distress  Assessment & Plan:   Assessment: Patient is an 88 year old male with a past medical history of chronic urinary retention status post chronic Foley, recurrent urinary tract infections, debility, and anxiety, who was admitted on 08/20/2023 after fall at home. Patient notes he became very weak when son was helping him go to the bathroom and slid to the floor. Patient has been admitted twice within the past 30 days due to generalized weakness and complicated urinary tract infections. At time of last hospital discharge, patient refused to go to rehab. During hospitalization, patient found to be positive for COVID-19. Patient also receiving management for odynophagia likely adding to concerns for possible aspiration pneumonia. Palliative medicine team consulted to assist with  complex medical decision making.   Recommendations/Plan: # Complex medical decision making/goals of care:   - Discussed care extensively with patient and patient's son, Edgar Mckay, over the phone on 08/25/2023.  Patient does not want to seek aggressive medical interventions including returning to the hospital.  Patient and son agreed with patient returning home with hospice support.  TOC assisting with coordination of care.  Patient and family chose [facility] hospice for home support.  Palliative medicine team continuing to assist with conversations moving forward as appropriate and able.                -  Code Status: Limited: Do not attempt resuscitation (DNR) -DNR-LIMITED -Do Not Intubate/DNI                                                  - Placed signed gold DNR form on patient's paper chart  # Psycho-social/Spiritual Support:  - Support System: Sons- Edgar Mckay, Edgar Mckay   # Discharge Planning:  Home with Hospice  Thank you for allowing the palliative care team to participate in the care Lamont C Kangas MckaySABRA Tinnie Radar, DO Palliative Care Provider PMT # [phone]  If patient remains symptomatic despite maximum doses, please call PMT at [phone] between 0700 and 1900. Outside of these hours, please call attending, as PMT does not have night coverage.

## 2023-08-26 NOTE — Progress Notes (Signed)
 Foley found to be leaking slightly - checked amount in balloon port and found 8 cc, reinflated and added 2 more, catheter is secure and no more leaking noted at this time, catheter in place, stat lock changed and pt prepared for d/c.

## 2023-08-29 ENCOUNTER — Other Ambulatory Visit (HOSPITAL_COMMUNITY): Payer: Self-pay

## 2023-08-30 ENCOUNTER — Other Ambulatory Visit (HOSPITAL_COMMUNITY): Payer: Self-pay

## 2023-09-16 ENCOUNTER — Other Ambulatory Visit (HOSPITAL_COMMUNITY): Payer: Self-pay

## 2023-09-30 ENCOUNTER — Other Ambulatory Visit (HOSPITAL_COMMUNITY): Payer: Self-pay

## 2023-10-02 ENCOUNTER — Other Ambulatory Visit (HOSPITAL_COMMUNITY): Payer: Self-pay

## 2023-10-02 MED ORDER — CIPROFLOXACIN HCL 500 MG PO TABS
500.0000 mg | ORAL_TABLET | Freq: Two times a day (BID) | ORAL | 0 refills | Status: DC
  Filled 2023-10-02: qty 14, 7d supply, fill #0

## 2023-10-12 ENCOUNTER — Other Ambulatory Visit (HOSPITAL_COMMUNITY): Payer: Self-pay

## 2023-10-12 MED ORDER — GUAIFENESIN ER 600 MG PO TB12
600.0000 mg | ORAL_TABLET | Freq: Two times a day (BID) | ORAL | 1 refills | Status: DC | PRN
  Filled 2023-10-12: qty 40, 20d supply, fill #0

## 2023-10-13 ENCOUNTER — Other Ambulatory Visit (HOSPITAL_COMMUNITY): Payer: Self-pay

## 2023-10-20 ENCOUNTER — Other Ambulatory Visit (HOSPITAL_COMMUNITY): Payer: Self-pay

## 2023-10-20 MED ORDER — OXYCODONE HCL 5 MG PO TABS
5.0000 mg | ORAL_TABLET | ORAL | 0 refills | Status: DC
  Filled 2023-10-20: qty 90, 15d supply, fill #0

## 2023-10-20 MED ORDER — OXYBUTYNIN CHLORIDE 5 MG PO TABS
5.0000 mg | ORAL_TABLET | Freq: Two times a day (BID) | ORAL | 0 refills | Status: DC
  Filled 2023-10-20: qty 60, 30d supply, fill #0

## 2023-10-24 ENCOUNTER — Other Ambulatory Visit (HOSPITAL_COMMUNITY): Payer: Self-pay

## 2023-10-24 MED ORDER — TAMSULOSIN HCL 0.4 MG PO CAPS
0.4000 mg | ORAL_CAPSULE | Freq: Every day | ORAL | 0 refills | Status: DC
  Filled 2023-10-24: qty 30, 30d supply, fill #0

## 2023-10-25 ENCOUNTER — Other Ambulatory Visit: Payer: Self-pay

## 2023-10-25 ENCOUNTER — Encounter (HOSPITAL_COMMUNITY): Payer: Self-pay

## 2023-10-25 ENCOUNTER — Emergency Department (HOSPITAL_COMMUNITY)
Admission: EM | Admit: 2023-10-25 | Discharge: 2023-10-25 | Disposition: A | Discharge status: Home / Self Care | Attending: Emergency Medicine | Admitting: Emergency Medicine

## 2023-10-25 ENCOUNTER — Other Ambulatory Visit (HOSPITAL_COMMUNITY): Payer: Self-pay

## 2023-10-25 ENCOUNTER — Emergency Department (HOSPITAL_COMMUNITY)

## 2023-10-25 DIAGNOSIS — N3090 Cystitis, unspecified without hematuria: Secondary | ICD-10-CM

## 2023-10-25 DIAGNOSIS — R338 Other retention of urine: Secondary | ICD-10-CM

## 2023-10-25 DIAGNOSIS — R339 Retention of urine, unspecified: Secondary | ICD-10-CM | POA: Diagnosis not present

## 2023-10-25 DIAGNOSIS — R1030 Lower abdominal pain, unspecified: Secondary | ICD-10-CM | POA: Diagnosis present

## 2023-10-25 LAB — COMPREHENSIVE METABOLIC PANEL WITH GFR
ALT: 23 U/L (ref 0–44)
AST: 26 U/L (ref 15–41)
Albumin: 3.8 g/dL (ref 3.5–5.0)
Alkaline Phosphatase: 71 U/L (ref 38–126)
Anion gap: 12 (ref 5–15)
BUN: 24 mg/dL — ABNORMAL HIGH (ref 8–23)
CO2: 21 mmol/L — ABNORMAL LOW (ref 22–32)
Calcium: 9.4 mg/dL (ref 8.9–10.3)
Chloride: 111 mmol/L (ref 98–111)
Creatinine, Ser: 0.87 mg/dL (ref 0.61–1.24)
GFR, Estimated: >60 mL/min (ref >60)
Glucose, Bld: 155 mg/dL — ABNORMAL HIGH (ref 70–99)
Potassium: 4.5 mmol/L (ref 3.5–5.1)
Sodium: 144 mmol/L (ref 135–145)
Total Bilirubin: 0.6 mg/dL (ref 0.0–1.2)
Total Protein: 6.6 g/dL (ref 6.5–8.1)

## 2023-10-25 LAB — CBC WITH DIFFERENTIAL/PLATELET
Abs Immature Granulocytes: 0.05 K/uL (ref 0.00–0.07)
Basophils Absolute: 0.1 K/uL (ref 0.0–0.1)
Basophils Relative: 0 %
Eosinophils Absolute: 0.1 K/uL (ref 0.0–0.5)
Eosinophils Relative: 1 %
HCT: 40.2 % (ref 39.0–52.0)
Hemoglobin: 12.5 g/dL — ABNORMAL LOW (ref 13.0–17.0)
Immature Granulocytes: 0 %
Lymphocytes Relative: 13 %
Lymphs Abs: 1.5 K/uL (ref 0.7–4.0)
MCH: 26.6 pg (ref 26.0–34.0)
MCHC: 31.1 g/dL (ref 30.0–36.0)
MCV: 85.5 fL (ref 80.0–100.0)
Monocytes Absolute: 1 K/uL (ref 0.1–1.0)
Monocytes Relative: 9 %
Neutro Abs: 8.6 K/uL — ABNORMAL HIGH (ref 1.7–7.7)
Neutrophils Relative %: 77 %
Platelets: 176 K/uL (ref 150–400)
RBC: 4.7 MIL/uL (ref 4.22–5.81)
RDW: 15.2 % (ref 11.5–15.5)
WBC: 11.3 K/uL — ABNORMAL HIGH (ref 4.0–10.5)
nRBC: 0 % (ref 0.0–0.2)

## 2023-10-25 LAB — URINALYSIS, ROUTINE W REFLEX MICROSCOPIC
Bacteria, UA: NONE SEEN
Glucose, UA: NEGATIVE mg/dL
Ketones, ur: NEGATIVE mg/dL
Nitrite: NEGATIVE
Protein, ur: >=300 mg/dL — AB
RBC / HPF: >50 RBC/hpf (ref 0–5)
Specific Gravity, Urine: 1.02 (ref 1.005–1.030)
WBC, UA: >50 WBC/hpf (ref 0–5)
pH: 8 (ref 5.0–8.0)

## 2023-10-25 LAB — I-STAT CG4 LACTIC ACID, ED: Lactic Acid, Venous: 1.4 mmol/L (ref 0.5–1.9)

## 2023-10-25 LAB — LIPASE, BLOOD: Lipase: 17 U/L (ref 11–51)

## 2023-10-25 MED ORDER — IOHEXOL 300 MG/ML  SOLN
100.0000 mL | Freq: Once | INTRAMUSCULAR | Status: AC | PRN
  Administered 2023-10-25: 100 mL via INTRAVENOUS

## 2023-10-25 MED ORDER — LORAZEPAM 1 MG PO TABS
1.0000 mg | ORAL_TABLET | ORAL | 0 refills | Status: DC | PRN
  Filled 2023-10-25: qty 90, 15d supply, fill #0

## 2023-10-25 MED ORDER — HYDROMORPHONE HCL 1 MG/ML IJ SOLN
0.5000 mg | INTRAMUSCULAR | Status: DC | PRN
  Administered 2023-10-25: 0.5 mg via INTRAVENOUS
  Filled 2023-10-25: qty 1

## 2023-10-25 MED ORDER — CEFPODOXIME PROXETIL 200 MG PO TABS
200.0000 mg | ORAL_TABLET | Freq: Two times a day (BID) | ORAL | 0 refills | Status: AC
  Filled 2023-10-25: qty 14, 7d supply, fill #0

## 2023-10-25 MED ORDER — MORPHINE SULFATE (CONCENTRATE) 10 MG /0.5 ML PO SOLN
10.0000 mg | ORAL | 0 refills | Status: DC | PRN
  Filled 2023-10-25: qty 30, 10d supply, fill #0

## 2023-10-25 MED ORDER — MORPHINE SULFATE (PF) 4 MG/ML IV SOLN
8.0000 mg | Freq: Once | INTRAVENOUS | Status: AC
  Administered 2023-10-25: 8 mg via INTRAVENOUS
  Filled 2023-10-25: qty 2

## 2023-10-25 MED ORDER — SODIUM CHLORIDE 0.9 % IV SOLN
1.0000 g | Freq: Once | INTRAVENOUS | Status: AC
  Administered 2023-10-25: 1 g via INTRAVENOUS
  Filled 2023-10-25: qty 10

## 2023-10-25 NOTE — ED Notes (Signed)
 Patient is resting at this time. Call bell is in patients reach.

## 2023-10-25 NOTE — ED Provider Notes (Signed)
 [facility] AT [facility] Provider Note   CSN: [id number] Arrival date & time: 10/25/23  9773     History Chief Complaint  Patient presents with   Abdominal Pain   Hematuria    HPI Edgar ROCKWELL Sr. is a 88 y.o. male presenting for chief complaint of abdominal pain. Patient just been given fentanyl by EMS and does not have much history to contribute at this time.  Will reevaluate later Family states that he has not been able to use the bathroom for days and is having severe abdominal pain.  Patient had reportedly requested EMS be called due to severity of pain and discomfort. Son states that they did not feel they were able to adequately control his symptoms at home primarily being abdominal pain.  Patient's recorded medical, surgical, social, medication list and allergies were reviewed in the Snapshot window as part of the initial history.   Review of Systems   Review of Systems  Constitutional:  Negative for chills and fever.  HENT:  Negative for ear pain and sore throat.   Eyes:  Negative for pain and visual disturbance.  Respiratory:  Negative for cough and shortness of breath.   Cardiovascular:  Negative for chest pain and palpitations.  Gastrointestinal:  Positive for abdominal pain, constipation and nausea. Negative for vomiting.  Genitourinary:  Negative for dysuria and hematuria.  Musculoskeletal:  Negative for arthralgias and back pain.  Skin:  Negative for color change and rash.  Neurological:  Negative for seizures and syncope.  All other systems reviewed and are negative.   Physical Exam Updated Vital Signs BP 119/76   Pulse (!) 104   Temp 98.4 F (36.9 C) (Oral)   Resp 15   Ht 5' 9 (1.753 m)   Wt 98.7 kg   SpO2 94%   BMI 32.13 kg/m  Physical Exam Vitals and nursing note reviewed.  Constitutional:      General: He is not in acute distress.    Appearance: He is well-developed.  HENT:     Head: Normocephalic and  atraumatic.  Eyes:     Conjunctiva/sclera: Conjunctivae normal.  Cardiovascular:     Rate and Rhythm: Normal rate and regular rhythm.     Heart sounds: No murmur heard. Pulmonary:     Effort: Pulmonary effort is normal. No respiratory distress.     Breath sounds: Normal breath sounds.  Abdominal:     Palpations: Abdomen is soft.     Tenderness: There is abdominal tenderness. There is no guarding.  Musculoskeletal:        General: No swelling.     Cervical back: Neck supple.  Skin:    General: Skin is warm and dry.     Capillary Refill: Capillary refill takes less than 2 seconds.  Neurological:     Mental Status: He is alert.  Psychiatric:        Mood and Affect: Mood normal.      ED Course/ Medical Decision Making/ A&P    Procedures Procedures   Medications Ordered in ED Medications  HYDROmorphone (DILAUDID) injection 0.5 mg (0.5 mg Intravenous Given 10/25/23 0551)  cefTRIAXone  (ROCEPHIN ) 1 g in sodium chloride  0.9 % 100 mL IVPB (has no administration in time range)  morphine  (PF) 4 MG/ML injection 8 mg (has no administration in time range)  iohexol  (OMNIPAQUE ) 300 MG/ML solution 100 mL (100 mLs Intravenous Contrast Given 10/25/23 0523)   Medical Decision Making:   Edgar [name] is  a 88 y.o. male who presented to the ED today with abdominal pain, detailed above.    Patient placed on continuous vitals and telemetry monitoring while in ED which was reviewed periodically.  Complete initial physical exam performed, notably the patient  was HDS in NAD.     Reviewed and confirmed nursing documentation for past medical history, family history, social history.    Initial Assessment:   With the patient's presentation of abdominal pain, most likely diagnosis is nonspecific etiology. Other diagnoses were considered including (but not limited to) gastroenteritis, colitis, small bowel obstruction, appendicitis, cholecystitis, pancreatitis, nephrolithiasis, UTI, pyleonephritis.  These are considered less likely due to history of present illness and physical exam findings.   This is most consistent with an acute life/limb threatening illness complicated by underlying chronic conditions.   Initial Plan:  CBC/CMP to evaluate for underlying infectious/metabolic etiology for patient's abdominal pain  Lipase to evaluate for pancreatitis  EKG to evaluate for cardiac source of pain  CTAB/Pelvis with contrast to evaluate for structural/surgical etiology of patients' severe abdominal pain.  Urinalysis and repeat physical assessment to evaluate for UTI/Pyelonpehritis  Empiric management of symptoms with escalating pain control and antiemetics as needed.   Initial Study Results:   Laboratory  All laboratory results reviewed without evidence of clinically relevant pathology.    EKG EKG was reviewed independently. Rate, rhythm, axis, intervals all examined and without medically relevant abnormality. ST segments without concerns for elevations.    Radiology All images reviewed independently. Agree with radiology report at this time.   CT ABDOMEN PELVIS W CONTRAST Result Date: 10/25/2023 CLINICAL DATA:  Abdominal pain and tightness. History of urinary retention. EXAM: CT ABDOMEN AND PELVIS WITH CONTRAST TECHNIQUE: Multidetector CT imaging of the abdomen and pelvis was performed using the standard protocol following bolus administration of intravenous contrast. RADIATION DOSE REDUCTION: This exam was performed according to the departmental dose-optimization program which includes automated exposure control, adjustment of the mA and/or kV according to patient size and/or use of iterative reconstruction technique. CONTRAST:  OMNIPAQUE  IOHEXOL  300 MG/ML  SOLN COMPARISON:  08/15/2023 FINDINGS: Lower chest: Dependent atelectasis noted in both lung bases. Circumferential wall thickening noted in the distal esophagus. Small hiatal hernia. Distal esophagus and hiatal hernia are  fluid-filled. Hepatobiliary: 8 mm hypervascular focus in the posterior hepatic dome is stable since prior, likely a benign etiology such as vascular malformation. Tiny hypodensity inferior right liver is too small to characterize but statistically most likely benign. No followup imaging is recommended. Tiny calcified gallstones are seen in the gallbladder. No intrahepatic or extrahepatic biliary dilation. Pancreas: Stable mild prominence of the main pancreatic duct through the head of the pancreas. Otherwise unremarkable pancreatic parenchyma. Spleen: No splenomegaly. No suspicious focal mass lesion. Adrenals/Urinary Tract: No adrenal nodule or mass. Tiny well-defined homogeneous low-density lesion in the right kidney is too small to characterize but statistically most likely benign and probably a cyst. No followup imaging is recommended. Mild bilateral hydroureteronephrosis. Bladder is distended despite the presence of a Foley catheter. Ill-defined circumferential bladder wall thickening evident. Dependent high density material along the mucosal surface of the posterior right bladder wall (75/2) may be excreted contrast, mucosal calcification, or dependent bladder stones. Perivesical edema/inflammation evident in the soft tissues of the pelvic floor. Stomach/Bowel: Stomach is distended with gas and fluid. Possible mild circumferential wall thickening in the antro pyloric region of the stomach. Duodenum is normally positioned as is the ligament of Treitz. No small bowel wall thickening. No small bowel  dilatation. The terminal ileum is normal. The appendix is normal. Scattered diverticular seen along the length of the colon. Moderate to large stool volume. Vascular/Lymphatic: There is mild atherosclerotic calcification of the abdominal aorta without aneurysm. Upper normal gastrohepatic ligament lymph nodes evident although there is no gastrohepatic or hepatoduodenal ligament lymphadenopathy. No retroperitoneal or  mesenteric lymphadenopathy. No pelvic sidewall lymphadenopathy. Reproductive: Prostate gland is markedly enlarged. Other: No substantial intraperitoneal free fluid. Musculoskeletal: No worrisome lytic or sclerotic osseous abnormality. IMPRESSION: 1. Mild bilateral hydroureteronephrosis with distended urinary bladder despite the presence of a Foley catheter. Bladder wall thickening with perivesical edema/inflammation suggests cystitis. Correlation for Foley catheter dysfunction recommended. 2. Dependent high density material along the mucosal surface of the posterior right bladder wall may be excreted contrast, mucosal calcification, or dependent bladder stones. 3. Marked prostatomegaly. 4. Circumferential wall thickening in the distal esophagus with small hiatal hernia. Distal esophagus and hiatal hernia are fluid-filled. Imaging features highly suspicious for esophagitis with reflux. 5. Possible mild circumferential wall thickening in the antro pyloric region of the stomach. Gastritis not excluded. 6. Cholelithiasis. 7. Colonic diverticulosis without diverticulitis. 8. Moderate to large stool volume. Imaging features could be compatible with clinical constipation. 9.  Aortic Atherosclerosis (ICD10-I70.0). Electronically Signed   By: Camellia Candle M.D.   On: 10/25/2023 06:04     Final Reassessment and Plan:   CT scan shows that patient's Foley catheter does not appear to be working.  Will have it replaced.  No bacteria in the urine.   After replacement of Foley catheter, pain is completely resolved, patient resting comfortably.  There was some ongoing cystitis on the CT scan.  Given patient's scope with therapy, will treat with IV Rocephin  X1 and place patient back on antibiotics to minimize any further discomfort.  This infection is likely the etiology of his ongoing hematuria. Had a goals of care conversation with the patient's son.  Ultimately they state that he is in the hospice at home program and they  would feel comfortable with him returning home given interval improvement in his symptoms. They stated they would have the hospice at home team come by today to evaluate him and that he would have ongoing care in that setting. Patient feels comfortable discharge at this time.  EMS transport being coordinated by nursing at this time.    Clinical Impression:  1. Acute urinary retention      Data Unavailable   Final Clinical Impression(s) / ED Diagnoses Final diagnoses:  Acute urinary retention    Rx / DC Orders ED Discharge Orders          Ordered    cefpodoxime (VANTIN) 200 MG tablet  2 times daily        10/25/23 9377              Jerral Meth, MD 10/25/23 [phone]

## 2023-10-25 NOTE — ED Triage Notes (Signed)
 Pt BIBA from home, c/o Lower abdominal pain, tightness and full feeling. Has a foley cath output is dark red. A&Ox4. Bed bound baseline. Sepsis end code.  20G LFA  300 LR 100 mcg fentanyl, tylenol  650   BP 140/80 HR 116 RR 26 O2 94 RA CBG 158

## 2023-10-30 LAB — CULTURE, BLOOD (ROUTINE X 2)
Culture: NO GROWTH
Culture: NO GROWTH

## 2023-12-11 DEATH — deceased
